# Patient Record
Sex: Female | Born: 1980 | Race: White | Hispanic: No | Marital: Single | Smoking: Never smoker
Health system: Southern US, Community
[De-identification: ages and names within clinical notes are randomized; demographics above are authoritative.]

## PROBLEM LIST (undated history)

## (undated) DIAGNOSIS — E079 Disorder of thyroid, unspecified: Secondary | ICD-10-CM

## (undated) DIAGNOSIS — E039 Hypothyroidism, unspecified: Secondary | ICD-10-CM

## (undated) DIAGNOSIS — E669 Obesity, unspecified: Secondary | ICD-10-CM

## (undated) HISTORY — DX: Hypothyroidism, unspecified: E03.9

---

## 2004-03-08 ENCOUNTER — Encounter (HOSPITAL_COMMUNITY): Admission: RE | Admit: 2004-03-08 | Discharge: 2004-03-09 | Payer: Self-pay | Admitting: Cardiology

## 2020-04-02 ENCOUNTER — Other Ambulatory Visit: Payer: Self-pay

## 2020-04-02 ENCOUNTER — Emergency Department (HOSPITAL_BASED_OUTPATIENT_CLINIC_OR_DEPARTMENT_OTHER): Payer: Self-pay

## 2020-04-02 ENCOUNTER — Encounter (HOSPITAL_BASED_OUTPATIENT_CLINIC_OR_DEPARTMENT_OTHER): Payer: Self-pay | Admitting: Emergency Medicine

## 2020-04-02 ENCOUNTER — Ambulatory Visit
Admission: EM | Admit: 2020-04-02 | Discharge: 2020-04-02 | Disposition: A | Discharge status: Home / Self Care | Payer: Self-pay

## 2020-04-02 ENCOUNTER — Inpatient Hospital Stay (HOSPITAL_BASED_OUTPATIENT_CLINIC_OR_DEPARTMENT_OTHER)
Admit: 2020-04-02 | Discharge: 2020-04-19 | DRG: 871 | Disposition: A | Discharge status: Home Health | Payer: Self-pay | Source: Other Acute Inpatient Hospital | Attending: Family Medicine | Admitting: Family Medicine

## 2020-04-02 DIAGNOSIS — Z79890 Hormone replacement therapy (postmenopausal): Secondary | ICD-10-CM

## 2020-04-02 DIAGNOSIS — K5900 Constipation, unspecified: Secondary | ICD-10-CM | POA: Diagnosis not present

## 2020-04-02 DIAGNOSIS — E039 Hypothyroidism, unspecified: Secondary | ICD-10-CM | POA: Diagnosis present

## 2020-04-02 DIAGNOSIS — K760 Fatty (change of) liver, not elsewhere classified: Secondary | ICD-10-CM | POA: Diagnosis present

## 2020-04-02 DIAGNOSIS — K8020 Calculus of gallbladder without cholecystitis without obstruction: Secondary | ICD-10-CM

## 2020-04-02 DIAGNOSIS — K8000 Calculus of gallbladder with acute cholecystitis without obstruction: Secondary | ICD-10-CM | POA: Diagnosis present

## 2020-04-02 DIAGNOSIS — N179 Acute kidney failure, unspecified: Secondary | ICD-10-CM

## 2020-04-02 DIAGNOSIS — R4689 Other symptoms and signs involving appearance and behavior: Secondary | ICD-10-CM

## 2020-04-02 DIAGNOSIS — R739 Hyperglycemia, unspecified: Secondary | ICD-10-CM

## 2020-04-02 DIAGNOSIS — E440 Moderate protein-calorie malnutrition: Secondary | ICD-10-CM | POA: Diagnosis present

## 2020-04-02 DIAGNOSIS — R0602 Shortness of breath: Secondary | ICD-10-CM

## 2020-04-02 DIAGNOSIS — K819 Cholecystitis, unspecified: Secondary | ICD-10-CM

## 2020-04-02 DIAGNOSIS — Z6841 Body Mass Index (BMI) 40.0 and over, adult: Secondary | ICD-10-CM

## 2020-04-02 DIAGNOSIS — R4189 Other symptoms and signs involving cognitive functions and awareness: Secondary | ICD-10-CM

## 2020-04-02 DIAGNOSIS — K851 Biliary acute pancreatitis without necrosis or infection: Secondary | ICD-10-CM | POA: Diagnosis present

## 2020-04-02 DIAGNOSIS — R209 Unspecified disturbances of skin sensation: Secondary | ICD-10-CM

## 2020-04-02 DIAGNOSIS — E860 Dehydration: Secondary | ICD-10-CM | POA: Diagnosis present

## 2020-04-02 DIAGNOSIS — R748 Abnormal levels of other serum enzymes: Secondary | ICD-10-CM

## 2020-04-02 DIAGNOSIS — R231 Pallor: Secondary | ICD-10-CM

## 2020-04-02 DIAGNOSIS — E872 Acidosis: Secondary | ICD-10-CM | POA: Diagnosis present

## 2020-04-02 DIAGNOSIS — R6510 Systemic inflammatory response syndrome (SIRS) of non-infectious origin without acute organ dysfunction: Secondary | ICD-10-CM | POA: Diagnosis present

## 2020-04-02 DIAGNOSIS — K810 Acute cholecystitis: Secondary | ICD-10-CM

## 2020-04-02 DIAGNOSIS — J9601 Acute respiratory failure with hypoxia: Secondary | ICD-10-CM

## 2020-04-02 DIAGNOSIS — K859 Acute pancreatitis without necrosis or infection, unspecified: Secondary | ICD-10-CM

## 2020-04-02 DIAGNOSIS — M549 Dorsalgia, unspecified: Secondary | ICD-10-CM

## 2020-04-02 DIAGNOSIS — R1084 Generalized abdominal pain: Secondary | ICD-10-CM

## 2020-04-02 DIAGNOSIS — R61 Generalized hyperhidrosis: Secondary | ICD-10-CM

## 2020-04-02 DIAGNOSIS — A419 Sepsis, unspecified organism: Principal | ICD-10-CM

## 2020-04-02 DIAGNOSIS — N390 Urinary tract infection, site not specified: Secondary | ICD-10-CM | POA: Diagnosis not present

## 2020-04-02 DIAGNOSIS — Z833 Family history of diabetes mellitus: Secondary | ICD-10-CM

## 2020-04-02 DIAGNOSIS — E662 Morbid (severe) obesity with alveolar hypoventilation: Secondary | ICD-10-CM | POA: Diagnosis present

## 2020-04-02 DIAGNOSIS — Z20822 Contact with and (suspected) exposure to covid-19: Secondary | ICD-10-CM | POA: Diagnosis present

## 2020-04-02 DIAGNOSIS — R1013 Epigastric pain: Secondary | ICD-10-CM

## 2020-04-02 DIAGNOSIS — R188 Other ascites: Secondary | ICD-10-CM

## 2020-04-02 DIAGNOSIS — B9620 Unspecified Escherichia coli [E. coli] as the cause of diseases classified elsewhere: Secondary | ICD-10-CM | POA: Diagnosis not present

## 2020-04-02 HISTORY — DX: Disorder of thyroid, unspecified: E07.9

## 2020-04-02 HISTORY — DX: Obesity, unspecified: E66.9

## 2020-04-02 LAB — COMPREHENSIVE METABOLIC PANEL
ALT: 168 U/L — ABNORMAL HIGH (ref 0–44)
AST: 74 U/L — ABNORMAL HIGH (ref 15–41)
Albumin: 3.8 g/dL (ref 3.5–5.0)
Alkaline Phosphatase: 130 U/L — ABNORMAL HIGH (ref 38–126)
Anion gap: 19 — ABNORMAL HIGH (ref 5–15)
BUN: 20 mg/dL (ref 6–20)
CO2: 17 mmol/L — ABNORMAL LOW (ref 22–32)
Calcium: 7.8 mg/dL — ABNORMAL LOW (ref 8.9–10.3)
Chloride: 100 mmol/L (ref 98–111)
Creatinine, Ser: 1.98 mg/dL — ABNORMAL HIGH (ref 0.44–1.00)
GFR calc Af Amer: 35 mL/min — ABNORMAL LOW (ref >60)
GFR calc non Af Amer: 31 mL/min — ABNORMAL LOW (ref >60)
Glucose, Bld: 228 mg/dL — ABNORMAL HIGH (ref 70–99)
Potassium: 3.6 mmol/L (ref 3.5–5.1)
Sodium: 136 mmol/L (ref 135–145)
Total Bilirubin: 4 mg/dL — ABNORMAL HIGH (ref 0.3–1.2)
Total Protein: 7.7 g/dL (ref 6.5–8.1)

## 2020-04-02 LAB — CBC WITH DIFFERENTIAL/PLATELET
Abs Immature Granulocytes: 1.16 10*3/uL — ABNORMAL HIGH (ref 0.00–0.07)
Basophils Absolute: 0.2 10*3/uL — ABNORMAL HIGH (ref 0.0–0.1)
Basophils Relative: 0 %
Eosinophils Absolute: 0 10*3/uL (ref 0.0–0.5)
Eosinophils Relative: 0 %
HCT: 56.6 % — ABNORMAL HIGH (ref 36.0–46.0)
Hemoglobin: 17.8 g/dL — ABNORMAL HIGH (ref 12.0–15.0)
Immature Granulocytes: 3 %
Lymphocytes Relative: 6 %
Lymphs Abs: 2.6 10*3/uL (ref 0.7–4.0)
MCH: 25.9 pg — ABNORMAL LOW (ref 26.0–34.0)
MCHC: 31.4 g/dL (ref 30.0–36.0)
MCV: 82.3 fL (ref 80.0–100.0)
Monocytes Absolute: 3.5 10*3/uL — ABNORMAL HIGH (ref 0.1–1.0)
Monocytes Relative: 8 %
Neutro Abs: 38.2 10*3/uL — ABNORMAL HIGH (ref 1.7–7.7)
Neutrophils Relative %: 83 %
Platelets: 534 10*3/uL — ABNORMAL HIGH (ref 150–400)
RBC: 6.88 MIL/uL — ABNORMAL HIGH (ref 3.87–5.11)
RDW: 17.4 % — ABNORMAL HIGH (ref 11.5–15.5)
Smear Review: NORMAL
WBC: 45.6 10*3/uL — ABNORMAL HIGH (ref 4.0–10.5)
nRBC: 0 % (ref 0.0–0.2)

## 2020-04-02 LAB — LACTIC ACID, PLASMA
Lactic Acid, Venous: 3.9 mmol/L (ref 0.5–1.9)
Lactic Acid, Venous: 3.3 mmol/L (ref 0.5–1.9)

## 2020-04-02 LAB — APTT: aPTT: 26 seconds (ref 24–36)

## 2020-04-02 LAB — LIPASE, BLOOD: Lipase: 154 U/L — ABNORMAL HIGH (ref 11–51)

## 2020-04-02 LAB — PREGNANCY, URINE: Preg Test, Ur: NEGATIVE

## 2020-04-02 LAB — POCT FASTING CBG KUC MANUAL ENTRY: POCT Glucose (KUC): 212 mg/dL — AB (ref 70–99)

## 2020-04-02 MED ORDER — ONDANSETRON HCL 4 MG/2ML IJ SOLN
4.0000 mg | Freq: Once | INTRAMUSCULAR | Status: AC
  Administered 2020-04-02: 4 mg via INTRAVENOUS
  Filled 2020-04-02: qty 2

## 2020-04-02 MED ORDER — HYDROMORPHONE HCL 1 MG/ML IJ SOLN: 0.5000 mg | INTRAMUSCULAR | Status: DC | PRN

## 2020-04-02 MED ORDER — SODIUM CHLORIDE 0.9 % IV SOLN
2.0000 g | Freq: Once | INTRAVENOUS | Status: AC
  Administered 2020-04-02: 2 g via INTRAVENOUS
  Filled 2020-04-02: qty 2

## 2020-04-02 MED ORDER — SODIUM CHLORIDE 0.9 % IV BOLUS (SEPSIS)
1000.0000 mL | Freq: Once | INTRAVENOUS | Status: AC
  Administered 2020-04-02: 1000 mL via INTRAVENOUS

## 2020-04-02 MED ORDER — LACTATED RINGERS IV BOLUS
1000.0000 mL | Freq: Once | INTRAVENOUS | Status: AC
  Administered 2020-04-03: 1000 mL via INTRAVENOUS

## 2020-04-02 MED ORDER — METOCLOPRAMIDE HCL 5 MG/ML IJ SOLN
10.0000 mg | Freq: Once | INTRAMUSCULAR | Status: AC
  Administered 2020-04-02: 10 mg via INTRAVENOUS
  Filled 2020-04-02: qty 2

## 2020-04-02 MED ORDER — FENTANYL CITRATE (PF) 100 MCG/2ML IJ SOLN
50.0000 ug | Freq: Once | INTRAMUSCULAR | Status: AC
  Administered 2020-04-02: 50 ug via INTRAVENOUS
  Filled 2020-04-02: qty 2

## 2020-04-02 MED ORDER — SODIUM CHLORIDE 0.9 % IV BOLUS (SEPSIS)
600.0000 mL | Freq: Once | INTRAVENOUS | Status: AC
  Administered 2020-04-02: 600 mL via INTRAVENOUS

## 2020-04-02 MED ORDER — METRONIDAZOLE IN NACL 5-0.79 MG/ML-% IV SOLN
500.0000 mg | Freq: Once | INTRAVENOUS | Status: AC
  Administered 2020-04-02: 500 mg via INTRAVENOUS
  Filled 2020-04-02: qty 100

## 2020-04-02 MED ORDER — SODIUM CHLORIDE 0.9 % IV SOLN
2.0000 g | Freq: Two times a day (BID) | INTRAVENOUS | Status: DC
  Administered 2020-04-03: 2 g via INTRAVENOUS
  Filled 2020-04-02: qty 2

## 2020-04-02 MED ORDER — VANCOMYCIN HCL 1750 MG/350ML IV SOLN
1750.0000 mg | INTRAVENOUS | Status: DC
  Filled 2020-04-02: qty 350

## 2020-04-02 MED ORDER — HYDROMORPHONE HCL 1 MG/ML IJ SOLN
0.5000 mg | Freq: Once | INTRAMUSCULAR | Status: AC
  Administered 2020-04-02: 0.5 mg via INTRAVENOUS
  Filled 2020-04-02: qty 1

## 2020-04-02 MED ORDER — VANCOMYCIN HCL IN DEXTROSE 1-5 GM/200ML-% IV SOLN
1000.0000 mg | INTRAVENOUS | Status: AC
  Administered 2020-04-02: 1000 mg via INTRAVENOUS
  Filled 2020-04-02: qty 200

## 2020-04-02 MED ORDER — SODIUM CHLORIDE 0.9 % IV SOLN
1000.0000 mL | INTRAVENOUS | Status: DC
  Administered 2020-04-03: 1000 mL via INTRAVENOUS

## 2020-04-02 MED ORDER — VANCOMYCIN HCL IN DEXTROSE 1-5 GM/200ML-% IV SOLN: 1000.0000 mg | Freq: Once | INTRAVENOUS | Status: DC

## 2020-04-02 NOTE — ED Triage Notes (Signed)
Pt c/o vomiting then center spine back pain then diarrhea x3 days. No vomiting today. Denies injury.

## 2020-04-02 NOTE — Progress Notes (Signed)
Pharmacy Antibiotic Note  Amber May is a 39 y.o. female admitted on 04/02/2020 with abdominal pain and subjective fever concerning for infection of unknown source.  Pharmacy has been consulted for Cefepime and vancomycin dosing. LA 3.9.   Plan: -Cefepime 2 gm IV Q 12 hours -Vancomycin 2 gm IV once followed by vancomycin 1750 mg IV Q 24 hours  -Monitor CBC, renal fx, cultures and clinical progress -VT at SS   Height: 5\' 2"  (157.5 cm) Weight: (!) 147.4 kg (325 lb) IBW/kg (Calculated) : 50.1  Temp (24hrs), Avg:98.1 F (36.7 C), Min:97.7 F (36.5 C), Max:98.5 F (36.9 C)  No results for input(s): WBC, CREATININE, LATICACIDVEN, VANCOTROUGH, VANCOPEAK, VANCORANDOM, GENTTROUGH, GENTPEAK, GENTRANDOM, TOBRATROUGH, TOBRAPEAK, TOBRARND, AMIKACINPEAK, AMIKACINTROU, AMIKACIN in the last 168 hours.  CrCl cannot be calculated (No successful lab value found.).    No Known Allergies  Antimicrobials this admission: Cefepime 6/24 >>  Vancomycin 6/24 >>   Dose adjustments this admission:  Microbiology results: 6/24 BCx:  6/24 UCx:    Thank you for allowing pharmacy to be a part of this patient's care.  7/24, PharmD., BCPS, BCCCP Clinical Pharmacist Clinical phone for 04/02/20 until 11:30pm: [phone] If after 11:30pm, please refer to [facility] for unit-specific pharmacist

## 2020-04-02 NOTE — ED Provider Notes (Signed)
[facility] EMERGENCY DEPARTMENT Provider Note   CSN: [id number] Arrival date & time: 04/02/20  1801     History Chief Complaint  Patient presents with  . Abdominal Pain    Amber May is a 39 y.o. female sent in from urgent care for abnormal vital signs.  History is given by the patient's mother predominantly and the patient.  Patient's mother states that she has had nausea and vomiting for the past 3 days with pretty severe epigastric abdominal pain.  They thought it was very likely a GI bug however today she began running a high fever.  They presented to the urgent care where the patient was found to be diaphoretic with cold extremities and she was sent emergently to the ER.  On arrival patient complains of epigastric abdominal pain.  She gives very short one-word answers and appears very ill.  She denies a history of previous abdominal surgeries.  She denies diarrhea.  HPI     Past Medical History:  Diagnosis Date  . Obesity   . Thyroid disease     There are no problems to display for this patient.   History reviewed. No pertinent surgical history.   OB History   No obstetric history on file.     No family history on file.  Social History   Tobacco Use  . Smoking status: Never Smoker  . Smokeless tobacco: Never Used  Substance Use Topics  . Alcohol use: Not Currently  . Drug use: Not Currently    Home Medications Prior to Admission medications   Medication Sig Start Date End Date Taking? Authorizing Provider  levothyroxine (SYNTHROID) 75 MCG tablet Take 75 mcg by mouth daily before breakfast.    [provider]    Allergies    Patient has no known allergies.  Review of Systems   Review of Systems Ten systems reviewed and are negative for acute change, except as noted in the HPI.   Physical Exam Updated Vital Signs BP 122/60 (BP Location: Right Arm)   Pulse (!) 132   Temp 98.5 F (36.9 C) (Oral)   Resp (!) 36   Ht 5\' 2"   (1.575 m)   Wt (!) 147.4 kg   LMP 03/09/2020   SpO2 96%   BMI 59.44 kg/m   Physical Exam Vitals and nursing note reviewed.  Constitutional:      Appearance: She is obese. She is ill-appearing, toxic-appearing and diaphoretic.  HENT:     Head: Normocephalic and atraumatic.     Mouth/Throat:     Mouth: Mucous membranes are dry.  Eyes:     Extraocular Movements: Extraocular movements intact.     Pupils: Pupils are equal, round, and reactive to light.  Cardiovascular:     Rate and Rhythm: Tachycardia present.  Pulmonary:     Comments: Respiratory rate in the 40s on my exam Tachypneic Breath sounds limited due to body habitus Abdominal:     General: There is distension.     Tenderness: There is abdominal tenderness. There is guarding and rebound.     Comments: Patient exquisitely tender to palpation in the epigastric region of the abdomen.  Exam is somewhat limited due to body habitus.  Abdomen is obese.  Appears distended.  Patient has pain with minimal jostling of the bed in the epigastric region.  Musculoskeletal:     Cervical back: Normal range of motion. No rigidity.     Right lower leg: No edema.     Left  lower leg: No edema.  Skin:    Capillary Refill: Capillary refill takes more than 3 seconds.     Coloration: Skin is pale.     Comments: Upper and lower extremities are cold, clammy.  Skin is diaphoretic.  Capillary refill is greater than 6 seconds  Neurological:     General: No focal deficit present.     Mental Status: She is alert and oriented to person, place, and time.     ED Results / Procedures / Treatments   Labs (all labs ordered are listed, but only abnormal results are displayed) Labs Reviewed  LACTIC ACID, PLASMA - Abnormal; Notable for the following components:      Result Value   Lactic Acid, Venous 3.9 (*)    All other components within normal limits  LACTIC ACID, PLASMA - Abnormal; Notable for the following components:   Lactic Acid, Venous 3.3 (*)     All other components within normal limits  COMPREHENSIVE METABOLIC PANEL - Abnormal; Notable for the following components:   CO2 17 (*)    Glucose, Bld 228 (*)    Creatinine, Ser 1.98 (*)    Calcium 7.8 (*)    AST 74 (*)    ALT 168 (*)    Alkaline Phosphatase 130 (*)    Total Bilirubin 4.0 (*)    GFR calc non Af Amer 31 (*)    GFR calc Af Amer 35 (*)    Anion gap 19 (*)    All other components within normal limits  CBC WITH DIFFERENTIAL/PLATELET - Abnormal; Notable for the following components:   WBC 45.6 (*)    RBC 6.88 (*)    Hemoglobin 17.8 (*)    HCT 56.6 (*)    MCH 25.9 (*)    RDW 17.4 (*)    Platelets 534 (*)    Neutro Abs 38.2 (*)    Monocytes Absolute 3.5 (*)    Basophils Absolute 0.2 (*)    Abs Immature Granulocytes 1.16 (*)    All other components within normal limits  LIPASE, BLOOD - Abnormal; Notable for the following components:   Lipase 154 (*)    All other components within normal limits  CULTURE, BLOOD (ROUTINE X 2)  CULTURE, BLOOD (ROUTINE X 2)  URINE CULTURE  SARS CORONAVIRUS 2 BY RT PCR (HOSPITAL ORDER, PERFORMED IN [facility])  APTT  PREGNANCY, URINE    EKG None  Radiology No results found.  Procedures .Critical Care Performed by: Arthor Captain, PA-C Authorized by: Arthor Captain, PA-C   Critical care provider statement:    Critical care time (minutes):  70   Critical care time was exclusive of:  Separately billable procedures and treating other patients   Critical care was necessary to treat or prevent imminent or life-threatening deterioration of the following conditions:  Sepsis   Critical care was time spent personally by me on the following activities:  Discussions with consultants, evaluation of patient's response to treatment, examination of patient, ordering and performing treatments and interventions, ordering and review of laboratory studies, ordering and review of radiographic studies, pulse oximetry,  re-evaluation of patient's condition, obtaining history from patient or surrogate and review of old charts   (including critical care time)  Medications Ordered in ED Medications  sodium chloride 0.9 % bolus 1,000 mL (0 mLs Intravenous Stopped 04/02/20 1856)    And  sodium chloride 0.9 % bolus 600 mL (has no administration in time range)  ceFEPIme (MAXIPIME) 2 g in sodium  chloride 0.9 % 100 mL IVPB (2 g Intravenous New Bag/Given 04/02/20 1909)  metroNIDAZOLE (FLAGYL) IVPB 500 mg (has no administration in time range)  vancomycin (VANCOCIN) IVPB 1000 mg/200 mL premix (has no administration in time range)  fentaNYL (SUBLIMAZE) injection 50 mcg (has no administration in time range)  ondansetron (ZOFRAN) injection 4 mg (has no administration in time range)    ED Course  I have reviewed the triage vital signs and the nursing notes.  Pertinent labs & imaging results that were available during my care of the patient were reviewed by me and considered in my medical decision making (see chart for details).  Clinical Course as of Apr 02 2299  Thu Apr 02, 2020  1930 Creatinine(!): 1.98 [AH]  1931 Glucose(!): 228 [AH]  1931 Creatinine(!): 1.98 [AH]  2047 WBC(!): 45.6 [AH]    Clinical Course User Index [AH] Arthor Captain, PA-C   MDM Rules/Calculators/A&P                          [id number] pain VS:  Vitals:   04/02/20 2100 04/02/20 2130 04/02/20 2200 04/02/20 2230  BP: (!) 127/109 (!) 130/96 (!) 124/93 (!) 128/94  Pulse: (!) 110 (!) 106 (!) 102 (!) 111  Resp: 20 20 20 20   Temp:  99.2 F (37.3 C)    TempSrc:  Oral    SpO2: 94% 95% 95% 96%  Weight:      Height:        is gathered by patient  and mother. Previous records obtained and reviewed. DDX:The patient's complaint of abdominal pain  involves an extensive number of diagnostic and treatment options, and is a complaint that carries with it a high risk of complications, morbidity, and potential mortality. Given the  large differential diagnosis, medical decision making is of high complexity. Differential diagnosis of epigastric pain includes: Functional or nonulcer dyspepsiaPUD, GERD, Gastritis, (NSAIDs, alcohol, stress, H. pylori, pernicious anemia), pancreatitis or pancreatic cancer, overeating indigestion (high-fat foods, coffee), drugs (aspirin, antibiotics (eg, macrolides, metronidazole), corticosteroids, digoxin, narcotics, theophylline), gastroparesis, lactose intolerance, malabsorption gastric cancer, parasitic infection, (Giardia, Strongyloides, Ascaris) cholelithiasis, choledocholithiasis, or cholangitis, ACS, pericarditis, pneumonia, abdominal hernia, pregnancy, intestinal ischemia, esophageal rupture, gastric volvulus, hepatitis.  Labs: Reviewed in medical decision making  Imaging: I ordered and reviewed images which included 1 view chest x-ray and CT abdomen pelvis without contrast. I independently visualized and interpreted all imaging. Significant findings include edema and peripancreatic stranding, layering inflammatory process within the gallbladder either sludge or stones, surrounding fluid and ascites in the pelvis.  Common bile duct unable to be fully visualized. There are no acute, significant findings on chest x-ray images.  Consults:GI and hospitalist service MDM: 39 year old female who presents emergency department chief complaint of severe abdominal pain.  Patient septic protocol and a code sepsis initiated with fluids per ideal body weight and broad-spectrum antibiotics with vancomycin cefepime and Flagyl ordered.  Patient's labs reveal significantly severe leukocytosis of 45,000, hemoglobin of 17.8 and elevated platelets.  There is likely some volume contraction given the fact that the CMP shows creatinine of 1.98 and AKI.  Patient's glucose is 228 likely reactive.  She has marked elevated AST, ALT and alkaline phosphatase with a bilirubin of 4.  Anion gap of 19 is likely due to her lactic  acidosis.  Patient's lactic acid has improved from 3.9 down to 3.3 after 3600 mL of normal saline.  Will continue to aggressively hydrate I have ordered 1 L  of lactated Ringer's.  Pain is unfortunately still poorly controlled she rates it at 8 out of 10.  Have scheduled pain medication hourly for the patient.  Believe the patient may have choledocholithiasis with gallstone pancreatitis versus ascending cholangitis.  Dr. Rhea Belton asked that we continue aggressive fluid hydration, pain control and trend patient's LFTs and lipase and they will see her tomorrow.  Patient will be admitted to the stepdown unit Patient disposition: Admit The patient appears reasonably screened and/or stabilized for discharge and I doubt any other medical condition or other [facility] requiring further screening, evaluation, or treatment in the ED at this time prior to discharge. I have discussed lab and/or imaging findings with the patient and answered all questions/concerns to the best of my ability.I have discussed return precautions and OP follow up.    Final Clinical Impression(s) / ED Diagnoses Final diagnoses:  Sepsis, due to unspecified organism, unspecified whether acute organ dysfunction present (HCC)  AKI (acute kidney injury) (HCC)  Elevated liver enzymes  Elevated lipase  Epigastric abdominal pain    Rx / DC Orders ED Discharge Orders    None       Arthor Captain, PA-C 04/02/20 2319    Rolan Bucco, MD 04/02/20 2355

## 2020-04-02 NOTE — ED Triage Notes (Signed)
abd pain, vomiting, subjective fever x 3 days. Sent from UC.

## 2020-04-02 NOTE — ED Provider Notes (Signed)
[facility]    CSN: [id number] Arrival date & time: 04/02/20  1650      History   Chief Complaint Chief Complaint  Patient presents with  . Back Pain    HPI Amber May is a 39 y.o. female with history of obesity, thyroid disease presenting with her mother for evaluation of abdominal and back pain with vomiting. Mother provides history: States patient vomited prior to back pain. Has had diarrhea for the last 3 days without blood or melena. Denies history of diabetes. Patient appears tachypneic, diaphoretic, pale. Mother states behaviors change from baseline over the last few days: More spacey than normal. No history of stroke or heart attack.   Past Medical History:  Diagnosis Date  . Obesity   . Thyroid disease     Patient Active Problem List   Diagnosis Date Noted  . AKI (acute kidney injury) (HCC) 04/03/2020  . Acute pancreatitis 04/03/2020  . Hypothyroidism 04/03/2020  . Hyperglycemia 04/03/2020  . Elevated liver enzymes   . Acute respiratory failure with hypoxemia (HCC)   . SIRS (systemic inflammatory response syndrome) (HCC) 04/02/2020    History reviewed. No pertinent surgical history.  OB History   No obstetric history on file.      Home Medications    Prior to Admission medications   Medication Sig Start Date End Date Taking? Authorizing Provider  levothyroxine (SYNTHROID) 125 MCG tablet Take 125 mcg by mouth daily before breakfast.    [provider]    Family History Family History  Problem Relation Age of Onset  . Diabetes Mellitus II Maternal Grandmother     Social History Social History   Tobacco Use  . Smoking status: Never Smoker  . Smokeless tobacco: Never Used  Substance Use Topics  . Alcohol use: Not Currently  . Drug use: Not Currently     Allergies   Patient has no known allergies.   Review of Systems As per HPI   Physical Exam Triage Vital Signs ED Triage Vitals  Enc Vitals Group     BP       Pulse      Resp      Temp      Temp src      SpO2      Weight      Height      Head Circumference      Peak Flow      Pain Score      Pain Loc      Pain Edu?      Excl. in GC?    No data found.  Updated Vital Signs Pulse (!) 137   Temp 97.7 F (36.5 C) (Oral)   LMP 03/09/2020   SpO2 93%   Visual Acuity Right Eye Distance:   Left Eye Distance:   Bilateral Distance:    Right Eye Near:   Left Eye Near:    Bilateral Near:     Physical Exam Constitutional:      General: She is not in acute distress.    Appearance: She is obese. She is ill-appearing and diaphoretic.  HENT:     Head: Normocephalic and atraumatic.  Eyes:     General: No scleral icterus.    Pupils: Pupils are equal, round, and reactive to light.  Neck:     Comments: Trachea midline, negative JVD Cardiovascular:     Rate and Rhythm: Regular rhythm. Tachycardia present.  Pulmonary:     Effort: Respiratory distress present.  Breath sounds: No wheezing.     Comments: Tachypneic with accessory muscle usage Skin:    Capillary Refill: Capillary refill takes more than 3 seconds.     Coloration: Skin is pale. Skin is not jaundiced.  Neurological:     Mental Status: She is alert.     Comments: Somewhat blank stare, slow to respond to questions.      UC Treatments / Results  Labs (all labs ordered are listed, but only abnormal results are displayed) Labs Reviewed  POCT FASTING [facility]G [facility] MANUAL ENTRY - Abnormal; Notable for the following components:      Result Value   POCT Glucose ([facility]) 212 (*)    All other components within normal limits    EKG   Radiology CT ABDOMEN PELVIS WO CONTRAST  Result Date: 04/02/2020 CLINICAL DATA:  Abdominal distension. EXAM: CT ABDOMEN AND PELVIS WITHOUT CONTRAST TECHNIQUE: Multidetector CT imaging of the abdomen and pelvis was performed following the standard protocol without IV contrast. COMPARISON:  None. FINDINGS: Lower chest: Motion artifact through the lung  bases. Elevation of right hemidiaphragm with compressive atelectasis. Possible trace right pleural effusion. Hepatobiliary: Enlarged liver spanning 20 cm cranial caudal with mild steatosis. No evidence of discrete focal lesion. Motion limits assessment for capsular nodularity. Layering hyperdensity in the gallbladder. Ascites adjacent to the gallbladder which is similar to the ascites adjacent to the liver. Common bile duct is not well-defined, there is no obvious biliary dilatation. Pancreas: Peripancreatic fat stranding and edema adjacent to the head and uncinate process. Small amount of non organized peripancreatic fluid. No ductal dilatation. No evidence of acute peripancreatic collection. Cannot assess for necrosis in the absence of IV contrast. Spleen: Normal in size without focal abnormality. Adrenals/Urinary Tract: Normal adrenal glands. No renal stones or hydronephrosis. Both ureters are decompressed without stones along the course. Urinary bladder is nondistended. Stomach/Bowel: Fluid/ingested material in the stomach. Mild duodenal wall thickening in the fourth portion adjacent to the pancreatic inflammation. There is no small bowel obstruction or inflammatory change. Detailed bowel assessment is limited in the absence of contrast and presence of abdominal ascites. The majority of the colon is decompressed. Vascular/Lymphatic: Normal caliber abdominal aorta. Few prominent periportal lymph nodes are likely reactive. Reproductive: Abnormal low-density in the lower uterine segment/cervix spanning 18 mm, nonspecific. No evidence of adnexal mass. Other: Small to moderate volume of abdominopelvic ascites. There is free fluid in both upper quadrants, both pericolic gutters, mesentery, and tracking into the pelvis. No free air or organized fluid collection. Small fat containing umbilical hernia. Musculoskeletal: Degenerative change in the spine. There are no acute or suspicious osseous abnormalities. IMPRESSION:  1. Acute edematous pancreatitis. No evidence of acute peripancreatic collection. 2. Hepatomegaly and hepatic steatosis. Layering hyperdensity in the gallbladder may represent sludge or layering stones. 3. Small to moderate volume of abdominopelvic ascites. This may be in part related to pancreatitis, underlying liver disease, or combination thereof. 4. Abnormal low-density in the lower uterine segment/cervix spanning 18 mm, nonspecific. Recommend nonemergent pelvic ultrasound for characterization on an elective outpatient basis. Electronically Signed   By: Narda Rutherford M.D.   On: 04/02/2020 20:06   MR ABDOMEN MRCP WO CONTRAST  Result Date: 04/03/2020 CLINICAL DATA:  Cholelithiasis. Known pancreatitis. Patient had difficulty breathing and was unable to complete the last series of the exam. EXAM: MRI ABDOMEN WITHOUT CONTRAST  (INCLUDING MRCP) TECHNIQUE: Multiplanar multisequence MR imaging of the abdomen was performed. Heavily T2-weighted images of the biliary and pancreatic ducts were obtained, and three-dimensional  MRCP images were rendered by post processing. COMPARISON:  CT scan 04/02/2020 FINDINGS: Lower chest: Bilateral pleural effusions associated with bibasilar collapse/consolidative changes. Hepatobiliary: Liver measures 20.8 cm craniocaudal length, enlarged. Loss of signal in the liver parenchyma on out of phase T1 imaging suggests fatty deposition. No focal abnormality in the liver parenchyma on this noncontrast motion degraded study. Numerous tiny gallstones evident with apparent gallbladder wall thickening. No intrahepatic biliary duct dilatation. No extrahepatic biliary duct dilatation. Study is markedly limited, but no definite choledocholithiasis. Axial T2 haste imaging demonstrates some central flow artifact in the common duct, but no definite ductal stones. Pancreas: No main duct dilatation. No gross mass lesion in the pancreas. Mild edema seen in the region of the pancreatic head. Spleen:   No splenomegaly. No focal mass lesion. Adrenals/Urinary Tract: No adrenal nodule or mass. No hydronephrosis. No gross renal mass on this noncontrast study. Probable 11 mm cyst interpolar left kidney. Stomach/Bowel: Stomach is unremarkable. No gastric wall thickening. No evidence of outlet obstruction. Duodenum is normally positioned as is the ligament of Treitz. No small bowel or colonic dilatation within the visualized abdomen. Vascular/Lymphatic: No abdominal aortic aneurysm. There is no gastrohepatic or hepatoduodenal ligament lymphadenopathy. No retroperitoneal or mesenteric lymphadenopathy. Other:  Moderate volume intraperitoneal free fluid. Musculoskeletal: No suspicious marrow signal abnormality within the visualized bony anatomy. IMPRESSION: 1. Limited study due to motion artifact and lack of intravenous contrast. Patient was unable to complete the entire exam. 2. Cholelithiasis with apparent gallbladder wall thickening. No intrahepatic or extrahepatic biliary duct dilatation. No definite choledocholithiasis. If there is clinical concern for acute cholecystitis, abdominal ultrasound or nuclear scintigraphy may prove helpful to further evaluate. 3. Mild edema in the region of the pancreatic head. No main duct dilatation. Imaging features may be related to pancreatitis. 4. Hepatomegaly with steatosis. 5. Moderate volume intraperitoneal free fluid. 6. Bilateral pleural effusions associated with bibasilar collapse/consolidative changes. Electronically Signed   By: Kennith Center M.D.   On: 04/03/2020 12:02   MR 3D Recon At Scanner  Result Date: 04/03/2020 CLINICAL DATA:  Cholelithiasis. Known pancreatitis. Patient had difficulty breathing and was unable to complete the last series of the exam. EXAM: MRI ABDOMEN WITHOUT CONTRAST  (INCLUDING MRCP) TECHNIQUE: Multiplanar multisequence MR imaging of the abdomen was performed. Heavily T2-weighted images of the biliary and pancreatic ducts were obtained, and  three-dimensional MRCP images were rendered by post processing. COMPARISON:  CT scan 04/02/2020 FINDINGS: Lower chest: Bilateral pleural effusions associated with bibasilar collapse/consolidative changes. Hepatobiliary: Liver measures 20.8 cm craniocaudal length, enlarged. Loss of signal in the liver parenchyma on out of phase T1 imaging suggests fatty deposition. No focal abnormality in the liver parenchyma on this noncontrast motion degraded study. Numerous tiny gallstones evident with apparent gallbladder wall thickening. No intrahepatic biliary duct dilatation. No extrahepatic biliary duct dilatation. Study is markedly limited, but no definite choledocholithiasis. Axial T2 haste imaging demonstrates some central flow artifact in the common duct, but no definite ductal stones. Pancreas: No main duct dilatation. No gross mass lesion in the pancreas. Mild edema seen in the region of the pancreatic head. Spleen:  No splenomegaly. No focal mass lesion. Adrenals/Urinary Tract: No adrenal nodule or mass. No hydronephrosis. No gross renal mass on this noncontrast study. Probable 11 mm cyst interpolar left kidney. Stomach/Bowel: Stomach is unremarkable. No gastric wall thickening. No evidence of outlet obstruction. Duodenum is normally positioned as is the ligament of Treitz. No small bowel or colonic dilatation within the visualized abdomen. Vascular/Lymphatic: No abdominal aortic  aneurysm. There is no gastrohepatic or hepatoduodenal ligament lymphadenopathy. No retroperitoneal or mesenteric lymphadenopathy. Other:  Moderate volume intraperitoneal free fluid. Musculoskeletal: No suspicious marrow signal abnormality within the visualized bony anatomy. IMPRESSION: 1. Limited study due to motion artifact and lack of intravenous contrast. Patient was unable to complete the entire exam. 2. Cholelithiasis with apparent gallbladder wall thickening. No intrahepatic or extrahepatic biliary duct dilatation. No definite  choledocholithiasis. If there is clinical concern for acute cholecystitis, abdominal ultrasound or nuclear scintigraphy may prove helpful to further evaluate. 3. Mild edema in the region of the pancreatic head. No main duct dilatation. Imaging features may be related to pancreatitis. 4. Hepatomegaly with steatosis. 5. Moderate volume intraperitoneal free fluid. 6. Bilateral pleural effusions associated with bibasilar collapse/consolidative changes. Electronically Signed   By: Misty Stanley M.D.   On: 04/03/2020 12:02   DG CHEST PORT 1 VIEW  Result Date: 04/03/2020 CLINICAL DATA:  Hypoxia, shortness of breath EXAM: PORTABLE CHEST 1 VIEW COMPARISON:  04/02/2020 FINDINGS: Examination is significantly limited secondary to poor penetration from patient body habitus, semiupright portable technique, and poor inspiratory effort. Very low lung volumes with accentuation of the heart size. Hazy and streaky bibasilar opacities suggesting small bilateral pleural effusions with bibasilar atelectasis. No pneumothorax. IMPRESSION: Limited examination. Hazy and streaky bibasilar opacities suggesting small bilateral pleural effusions with bibasilar atelectasis. Electronically Signed   By: Davina Poke D.O.   On: 04/03/2020 13:59   DG Chest Port 1 View  Result Date: 04/02/2020 CLINICAL DATA:  Sepsis EXAM: PORTABLE CHEST 1 VIEW COMPARISON:  None. FINDINGS: Low lung volume with hypoventilatory change. Atelectasis at the right base. Borderline cardiomegaly augmented by low lung volume. No pneumothorax. IMPRESSION: Low lung volume with atelectasis at the right base. Electronically Signed   By: Donavan Foil M.D.   On: 04/02/2020 20:08    Procedures Procedures (including critical care time)  Medications Ordered in UC Medications - No data to display  Initial Impression / Assessment and Plan / UC Course  I have reviewed the triage vital signs and the nursing notes.  Pertinent labs & imaging results that were  available during my care of the patient were reviewed by me and considered in my medical decision making (see chart for details).     Patient appears diaphoretic, pale, tachypneic with tachycardia. No significant comorbidities, though patient clinically appears ill. There is change from baseline and cognitive behavioral function per mother. EKG done office, reviewed by me without previous to compare.  Sinus tachycardia with ventricular rate of 133 bpm.  No QTC prolongation, there are multiple leads with polymorphic P waves.  No ST elevation or depression.  Discussed findings with patient and mother.  Recommended to ER for further evaluation due to hyperglycemia, abnormal EKG, clinical appearance.  Mother declined: Health and safety inspector to transport and personal vehicle.  Return precautions discussed, patient verbalized understanding and is agreeable to plan. Final Clinical Impressions(s) / UC Diagnoses   Final diagnoses:  Diaphoresis  Cold and clammy skin  Acute midline back pain, unspecified back location  Generalized abdominal pain  Hyperglycemia  Cognitive and behavioral changes   Discharge Instructions   None    ED Prescriptions    None     I have reviewed the PDMP during this encounter.   [place], [place], [place] 04/03/20 1649

## 2020-04-03 ENCOUNTER — Encounter (HOSPITAL_COMMUNITY): Payer: Self-pay | Admitting: Internal Medicine

## 2020-04-03 ENCOUNTER — Inpatient Hospital Stay (HOSPITAL_COMMUNITY): Payer: Self-pay

## 2020-04-03 ENCOUNTER — Encounter: Payer: Self-pay | Admitting: Emergency Medicine

## 2020-04-03 DIAGNOSIS — J9601 Acute respiratory failure with hypoxia: Secondary | ICD-10-CM

## 2020-04-03 DIAGNOSIS — N179 Acute kidney failure, unspecified: Secondary | ICD-10-CM

## 2020-04-03 DIAGNOSIS — K859 Acute pancreatitis without necrosis or infection, unspecified: Secondary | ICD-10-CM | POA: Diagnosis present

## 2020-04-03 DIAGNOSIS — R6510 Systemic inflammatory response syndrome (SIRS) of non-infectious origin without acute organ dysfunction: Secondary | ICD-10-CM

## 2020-04-03 DIAGNOSIS — R739 Hyperglycemia, unspecified: Secondary | ICD-10-CM

## 2020-04-03 DIAGNOSIS — R748 Abnormal levels of other serum enzymes: Secondary | ICD-10-CM

## 2020-04-03 DIAGNOSIS — E039 Hypothyroidism, unspecified: Secondary | ICD-10-CM | POA: Diagnosis present

## 2020-04-03 LAB — BASIC METABOLIC PANEL
Anion gap: 11 (ref 5–15)
BUN: 21 mg/dL — ABNORMAL HIGH (ref 6–20)
CO2: 18 mmol/L — ABNORMAL LOW (ref 22–32)
Calcium: 6.4 mg/dL — CL (ref 8.9–10.3)
Chloride: 106 mmol/L (ref 98–111)
Creatinine, Ser: 1.22 mg/dL — ABNORMAL HIGH (ref 0.44–1.00)
GFR calc Af Amer: >60 mL/min (ref >60)
GFR calc non Af Amer: 55 mL/min — ABNORMAL LOW (ref >60)
Glucose, Bld: 172 mg/dL — ABNORMAL HIGH (ref 70–99)
Potassium: 4.1 mmol/L (ref 3.5–5.1)
Sodium: 135 mmol/L (ref 135–145)

## 2020-04-03 LAB — CBC WITH DIFFERENTIAL/PLATELET
Abs Immature Granulocytes: 0.72 10*3/uL — ABNORMAL HIGH (ref 0.00–0.07)
Abs Immature Granulocytes: 0.43 10*3/uL — ABNORMAL HIGH (ref 0.00–0.07)
Basophils Absolute: 0.1 10*3/uL (ref 0.0–0.1)
Basophils Absolute: 0.2 10*3/uL — ABNORMAL HIGH (ref 0.0–0.1)
Basophils Relative: 0 %
Basophils Relative: 0 %
Eosinophils Absolute: 0 10*3/uL (ref 0.0–0.5)
Eosinophils Absolute: 0 10*3/uL (ref 0.0–0.5)
Eosinophils Relative: 0 %
Eosinophils Relative: 0 %
HCT: 50.5 % — ABNORMAL HIGH (ref 36.0–46.0)
HCT: 48.7 % — ABNORMAL HIGH (ref 36.0–46.0)
Hemoglobin: 16 g/dL — ABNORMAL HIGH (ref 12.0–15.0)
Hemoglobin: 15.5 g/dL — ABNORMAL HIGH (ref 12.0–15.0)
Immature Granulocytes: 2 %
Immature Granulocytes: 1 %
Lymphocytes Relative: 5 %
Lymphocytes Relative: 5 %
Lymphs Abs: 1.8 10*3/uL (ref 0.7–4.0)
Lymphs Abs: 1.8 10*3/uL (ref 0.7–4.0)
MCH: 26.3 pg (ref 26.0–34.0)
MCH: 26.3 pg (ref 26.0–34.0)
MCHC: 31.7 g/dL (ref 30.0–36.0)
MCHC: 31.8 g/dL (ref 30.0–36.0)
MCV: 83.1 fL (ref 80.0–100.0)
MCV: 82.5 fL (ref 80.0–100.0)
Monocytes Absolute: 2.4 10*3/uL — ABNORMAL HIGH (ref 0.1–1.0)
Monocytes Absolute: 2.2 10*3/uL — ABNORMAL HIGH (ref 0.1–1.0)
Monocytes Relative: 6 %
Monocytes Relative: 6 %
Neutro Abs: 34.2 10*3/uL — ABNORMAL HIGH (ref 1.7–7.7)
Neutro Abs: 33.8 10*3/uL — ABNORMAL HIGH (ref 1.7–7.7)
Neutrophils Relative %: 87 %
Neutrophils Relative %: 88 %
Platelets: 333 10*3/uL (ref 150–400)
Platelets: 289 10*3/uL (ref 150–400)
RBC: 6.08 MIL/uL — ABNORMAL HIGH (ref 3.87–5.11)
RBC: 5.9 MIL/uL — ABNORMAL HIGH (ref 3.87–5.11)
RDW: 16.8 % — ABNORMAL HIGH (ref 11.5–15.5)
RDW: 16.8 % — ABNORMAL HIGH (ref 11.5–15.5)
WBC Morphology: INCREASED
WBC: 39.3 10*3/uL — ABNORMAL HIGH (ref 4.0–10.5)
WBC: 38.3 10*3/uL — ABNORMAL HIGH (ref 4.0–10.5)
nRBC: 0 % (ref 0.0–0.2)
nRBC: 0 % (ref 0.0–0.2)

## 2020-04-03 LAB — LACTIC ACID, PLASMA
Lactic Acid, Venous: 2.2 mmol/L (ref 0.5–1.9)
Lactic Acid, Venous: 2.6 mmol/L (ref 0.5–1.9)
Lactic Acid, Venous: 2.2 mmol/L (ref 0.5–1.9)
Lactic Acid, Venous: 1.9 mmol/L (ref 0.5–1.9)

## 2020-04-03 LAB — HIV ANTIBODY (ROUTINE TESTING W REFLEX): HIV Screen 4th Generation wRfx: NONREACTIVE

## 2020-04-03 LAB — HEPATIC FUNCTION PANEL
ALT: 101 U/L — ABNORMAL HIGH (ref 0–44)
AST: 33 U/L (ref 15–41)
Albumin: 2.8 g/dL — ABNORMAL LOW (ref 3.5–5.0)
Alkaline Phosphatase: 96 U/L (ref 38–126)
Bilirubin, Direct: 1.3 mg/dL — ABNORMAL HIGH (ref 0.0–0.2)
Indirect Bilirubin: 1.1 mg/dL — ABNORMAL HIGH (ref 0.3–0.9)
Total Bilirubin: 2.4 mg/dL — ABNORMAL HIGH (ref 0.3–1.2)
Total Protein: 5.8 g/dL — ABNORMAL LOW (ref 6.5–8.1)

## 2020-04-03 LAB — GLUCOSE, CAPILLARY
Glucose-Capillary: 154 mg/dL — ABNORMAL HIGH (ref 70–99)
Glucose-Capillary: 149 mg/dL — ABNORMAL HIGH (ref 70–99)
Glucose-Capillary: 141 mg/dL — ABNORMAL HIGH (ref 70–99)

## 2020-04-03 LAB — PROCALCITONIN: Procalcitonin: 1.45 ng/mL

## 2020-04-03 LAB — C-REACTIVE PROTEIN: CRP: 30.1 mg/dL — ABNORMAL HIGH (ref <1.0)

## 2020-04-03 LAB — TROPONIN I (HIGH SENSITIVITY): Troponin I (High Sensitivity): 54 ng/L — ABNORMAL HIGH (ref <18)

## 2020-04-03 LAB — MAGNESIUM: Magnesium: 1.5 mg/dL — ABNORMAL LOW (ref 1.7–2.4)

## 2020-04-03 LAB — TSH: TSH: 2.379 u[IU]/mL (ref 0.350–4.500)

## 2020-04-03 LAB — HEMOGLOBIN A1C
Hgb A1c MFr Bld: 5.4 % (ref 4.8–5.6)
Mean Plasma Glucose: 108.28 mg/dL

## 2020-04-03 LAB — TRIGLYCERIDES: Triglycerides: 176 mg/dL — ABNORMAL HIGH (ref <150)

## 2020-04-03 LAB — SARS CORONAVIRUS 2 BY RT PCR (HOSPITAL ORDER, PERFORMED IN ~~LOC~~ HOSPITAL LAB): SARS Coronavirus 2: NEGATIVE

## 2020-04-03 MED ORDER — LACTATED RINGERS IV BOLUS: 500.0000 mL | Freq: Once | INTRAVENOUS | Status: DC

## 2020-04-03 MED ORDER — HYDROMORPHONE HCL 1 MG/ML IJ SOLN
0.5000 mg | INTRAMUSCULAR | Status: DC | PRN
  Administered 2020-04-03 – 2020-04-19 (×93): 0.5 mg via INTRAVENOUS
  Filled 2020-04-03: qty 0.5
  Filled 2020-04-03 (×3): qty 1
  Filled 2020-04-03: qty 0.5
  Filled 2020-04-03 (×4): qty 1
  Filled 2020-04-03: qty 0.5
  Filled 2020-04-03 (×2): qty 1
  Filled 2020-04-03 (×5): qty 0.5
  Filled 2020-04-03 (×3): qty 1
  Filled 2020-04-03: qty 0.5
  Filled 2020-04-03 (×3): qty 1
  Filled 2020-04-03 (×2): qty 0.5
  Filled 2020-04-03 (×3): qty 1
  Filled 2020-04-03: qty 0.5
  Filled 2020-04-03: qty 1
  Filled 2020-04-03 (×3): qty 0.5
  Filled 2020-04-03 (×3): qty 1
  Filled 2020-04-03 (×2): qty 0.5
  Filled 2020-04-03 (×2): qty 1
  Filled 2020-04-03 (×3): qty 0.5
  Filled 2020-04-03: qty 1
  Filled 2020-04-03 (×2): qty 0.5
  Filled 2020-04-03 (×2): qty 1
  Filled 2020-04-03 (×2): qty 0.5
  Filled 2020-04-03 (×2): qty 1
  Filled 2020-04-03 (×6): qty 0.5
  Filled 2020-04-03: qty 1
  Filled 2020-04-03 (×3): qty 0.5
  Filled 2020-04-03 (×3): qty 1
  Filled 2020-04-03 (×3): qty 0.5
  Filled 2020-04-03 (×6): qty 1
  Filled 2020-04-03: qty 0.5
  Filled 2020-04-03: qty 1
  Filled 2020-04-03 (×2): qty 0.5
  Filled 2020-04-03: qty 1
  Filled 2020-04-03 (×2): qty 0.5
  Filled 2020-04-03: qty 1
  Filled 2020-04-03 (×3): qty 0.5
  Filled 2020-04-03: qty 1
  Filled 2020-04-03: qty 0.5
  Filled 2020-04-03 (×3): qty 1
  Filled 2020-04-03 (×3): qty 0.5

## 2020-04-03 MED ORDER — LEVOTHYROXINE SODIUM 100 MCG/5ML IV SOLN
75.0000 ug | Freq: Every day | INTRAVENOUS | Status: DC
  Administered 2020-04-03 – 2020-04-10 (×8): 75 ug via INTRAVENOUS
  Filled 2020-04-03 (×6): qty 5

## 2020-04-03 MED ORDER — SODIUM CHLORIDE 0.9 % IV SOLN
INTRAVENOUS | Status: DC | PRN
  Administered 2020-04-03 – 2020-04-05 (×2): 250 mL via INTRAVENOUS

## 2020-04-03 MED ORDER — SODIUM CHLORIDE 0.9 % IV SOLN
2.0000 g | Freq: Three times a day (TID) | INTRAVENOUS | Status: DC
  Administered 2020-04-03 – 2020-04-19 (×48): 2 g via INTRAVENOUS
  Filled 2020-04-03 (×50): qty 2

## 2020-04-03 MED ORDER — ONDANSETRON HCL 4 MG PO TABS: 4.0000 mg | ORAL_TABLET | Freq: Four times a day (QID) | ORAL | Status: DC | PRN

## 2020-04-03 MED ORDER — ONDANSETRON HCL 4 MG/2ML IJ SOLN
4.0000 mg | Freq: Four times a day (QID) | INTRAMUSCULAR | Status: DC | PRN
  Administered 2020-04-03: 4 mg via INTRAVENOUS
  Filled 2020-04-03: qty 2

## 2020-04-03 MED ORDER — LACTATED RINGERS IV SOLN: INTRAVENOUS | Status: AC

## 2020-04-03 MED ORDER — CHLORHEXIDINE GLUCONATE CLOTH 2 % EX PADS
6.0000 | MEDICATED_PAD | Freq: Every day | CUTANEOUS | Status: DC
  Administered 2020-04-03 – 2020-04-18 (×12): 6 via TOPICAL

## 2020-04-03 MED ORDER — MAGNESIUM SULFATE 2 GM/50ML IV SOLN
2.0000 g | Freq: Once | INTRAVENOUS | Status: AC
  Administered 2020-04-03: 2 g via INTRAVENOUS
  Filled 2020-04-03: qty 50

## 2020-04-03 MED ORDER — INSULIN ASPART 100 UNIT/ML ~~LOC~~ SOLN
0.0000 [IU] | Freq: Four times a day (QID) | SUBCUTANEOUS | Status: DC
  Administered 2020-04-03 – 2020-04-05 (×5): 1 [IU] via SUBCUTANEOUS
  Administered 2020-04-05: 2 [IU] via SUBCUTANEOUS
  Administered 2020-04-05 – 2020-04-06 (×2): 1 [IU] via SUBCUTANEOUS
  Administered 2020-04-06: 2 [IU] via SUBCUTANEOUS
  Administered 2020-04-06 – 2020-04-10 (×4): 1 [IU] via SUBCUTANEOUS

## 2020-04-03 MED ORDER — CALCIUM GLUCONATE-NACL 1-0.675 GM/50ML-% IV SOLN
1.0000 g | Freq: Once | INTRAVENOUS | Status: AC
  Administered 2020-04-03: 1000 mg via INTRAVENOUS
  Filled 2020-04-03: qty 50

## 2020-04-03 MED ORDER — ORAL CARE MOUTH RINSE
15.0000 mL | Freq: Two times a day (BID) | OROMUCOSAL | Status: DC
  Administered 2020-04-03 – 2020-04-18 (×30): 15 mL via OROMUCOSAL

## 2020-04-03 MED ORDER — LACTATED RINGERS IV BOLUS
1000.0000 mL | Freq: Once | INTRAVENOUS | Status: AC
  Administered 2020-04-03: 1000 mL via INTRAVENOUS

## 2020-04-03 MED ORDER — VANCOMYCIN HCL 1250 MG/250ML IV SOLN
1250.0000 mg | Freq: Two times a day (BID) | INTRAVENOUS | Status: DC
  Administered 2020-04-03 – 2020-04-04 (×2): 1250 mg via INTRAVENOUS
  Filled 2020-04-03 (×3): qty 250

## 2020-04-03 NOTE — Progress Notes (Addendum)
S: Dry MM, Soft pressure, vomiting and Diarrhea x 3 days prior to admission O: Foley placed as no UO in > 8 hours      Total of 230 cc's concentrated urine per foley cath ( right at 30 cc/hr over           past 8 hours)       Lactate increase between 7 am and 9 am on 6/25 from  2.2>>2.6       HGB remains elevated at 16>> suspect Hemo concentrated  A: Dehydration , Increasing lactic Acid P: Additional 1 L of LR now as bolus      Trend Lactate closely       Strict I&O  We will continue to follow along with Primary Team  Bevelyn Ngo, MSN, AGACNP-BC [facility] Pulmonary/Critical Care Medicine See Amion for personal pager PCCM on call pager [phone] 04/03/2020 1:29 PM

## 2020-04-03 NOTE — Consult Note (Addendum)
NAME:  Amber May, MRN:  [id number], DOB:  June 25, 1981, LOS: 0 ADMISSION DATE:  04/02/2020, CONSULTATION DATE:  6/25 REFERRING MD:  Sarajane Jews, CHIEF COMPLAINT:   Acute hypoxic respiratory failure in setting of pancreatitis  Brief History   39 year old woman PMH hypothyroidism, morbid obesity presented with abdominal pain and fever.  Admitted 6/24 for severe acute pancreatitis, SIRS. She has had progressive hypoxic respiratory failure since admission with  increased work of breathing. PCCM have been consulted as a precaution  in the event patient has progressive decompensation of respiratory status, requiring intubation.  History of present illness   Amber May is a 39 y.o. female with a history of thyroid disease and obesity, no other significant history, admitted 6/24 with pancreatitis. Patient has had abdominal pain along with nausea / vomiting since Tuesday  6/22 ( x 3 days) . Symptoms have progressed, including  diaphoresis and fever since 6/23 , she presented to the hospital for care. She denies ever having pancreatitis or problems with her pancreas before. Her grandmother passed away from pancreatitis. She does not drink any alcohol. No prior surgical history.   ED presentation included WBC of 45, lactic acidosis. COVID negative. Lipase only mildly elevated to 144, AST 74, ALT 168, AP 130, T bil 4.0. Cr 1.98 on admission. CT scan done showing acute pancreatitis, hepatomegaly with steatosis, sludge / stones suspected in the gallbladder. No obvious biliary ductal dilation. She was given 4 L fluid bolus and then on 250cc/hr of LR. Given IV cefepime.   She remains tachycardic to low 100s, BP is soft, and she has   tachypnea , RR 40-50  , but she is maintaining sats of 95-95% at present. Mental status is clear. LFT's are down trending ( bil to 2.4, AP 96, ALT 101, AST 33),Cr improved 1.22, Ca at 6.4.  WBC at 39 today, Hgb went from 17.8 to 16.0 overnight. Lactic acidosis improving.  Troponin  is positive to 54. Trigs 176.   GI has been consulted and patient is scheduled for an MRCP within the hour to evaluate her biliary tree  PCCM have been consulted as a precaution in the event patient has progressive decompensation of respiratory status, requiring intubation.   Past Medical History    Past Medical History:  Diagnosis Date  . Obesity   . Thyroid disease     Significant Hospital Events   03/31/2020>> Symptoms started 04/02/2020>> Admission to hospital  Consults:  6/25 PCCM 6/25 GI  Procedures:    Significant Diagnostic Tests:  04/03/2020>> MRCP to rule out choledocholithiasis / cholangitis  6/24 CXR>>  Low lung volume with hypoventilatory change. Atelectasis at the right base. Borderline cardiomegaly augmented by low lung volume. No Pneumothorax.  6/25 MR Abdomen Limited study due to motion artifact and lack of intravenous contrast. Patient was unable to complete the entire exam. 2. Cholelithiasis with apparent gallbladder wall thickening. No intrahepatic or extrahepatic biliary duct dilatation. No definite choledocholithiasis. If there is clinical concern for acute cholecystitis, abdominal ultrasound or nuclear scintigraphy may prove helpful to further evaluate. 3. Mild edema in the region of the pancreatic head. No main duct dilatation. Imaging features may be related to pancreatitis. 4. Hepatomegaly with steatosis.  5. Moderate volume intraperitoneal free fluid. 6. Bilateral pleural effusions associated with bibasilar collapse/consolidative changes.   Micro Data:  04/02/2020 Blood Cultures>> 04/02/2020 Urine>> 04/02/2020  SARS Coronavirus 2 NEGATIVE NEGATIVE       Antimicrobials:  Cefepime 04/02/2020>> Vancomycin 04/03/2020>>  Flagyl 04/02/2020 x 1  dose in ED  Interim history/subjective:  States she has continued nausea and abdominal epigastric  pain She states she feels short of breath but feels she is managing OK right now on oxygen For MRCP  this morning.  Currently on 4 L with sats of 94%. RR 36 , Accessory muscle use noted on exam T max 100.4 WBC 39.3 Procalcitonin 1.45 Lactic Acid 2.6 ( Slight increase since last draw) + 2800 cc's since admission Was given total of 4 L in the ED Currently LR at  250/hr For MRCP to rule out choledocholithiasis / cholangitis    Objective   Blood pressure 137/88, pulse (!) 116, temperature 97.9 F (36.6 C), temperature source Oral, resp. rate (!) 42, height _0  (1.575 m), weight (!) 140.9 kg, last menstrual period 03/09/2020, SpO2 94 %.        Intake/Output Summary (Last 24 hours) at 04/03/2020 1030 Last data filed at 04/03/2020 0012 Gross per 24 hour  Intake 2804.42 ml  Output --  Net 2804.42 ml   Filed Weights   04/02/20 1813 04/03/20 0100  Weight: (!) 147.4 kg (!) 140.9 kg    Examination: General: Awake and alert obese female supine in bed in mild respiratory distress HENT: NCAT, MM dry,  No icterus. Conjunctiva pink Lungs: Bilateral chest excursion, shallow rapid respirations, clear and diminished at present Cardiovascular: S1, S2, RRR, ST per tele Abdomen: Obese, soft, epigastric tenderness  to palpation , BS + Extremities: No edema, no obvious deformities, cap refill > 3 seconds Neuro: Grossly intat, A&O x 3, MAE x 4, follows all commands GU: Not assessed  Resolved Hospital Problem list     Assessment & Plan:  Acute Hypoxic Respiratory failure in setting of pancreatitis  CXR with atelectasis Right base Bilateral pleural effusions associated with bibasilar collapse/consolidative changes per abdominal MRCP. Plan Titrate oxygen to maintain oxygen saturations > 92% CXR today , in am and prn Aggressive Pulmonary Toilet IS Q1 while awake OOB when stable enough to do so ABG prn Trend mag with goal of > 2  Acute Pancreatitis with SIRS, appears uncomplicated per CT, but severe clinical features ( Fever, Tachypnea, Lactic Acidosis, Leukocytosis, AKI, Elevated  LFT's) Triglycerides slightly elevated Transaminases and total bilirubin elevated on admission/  imaging suggested possible stones in GB  Suspect gallstone pancreatitis Alkaline phosphatase, AST have normalized.  ALT and total bilirubin trending down.  + Cholelithiasis per MRCP Plan Per GI Continue Empiric Antibiotics Follow  Micro Re-culture as is clinically indicated Continue LR at 250 per hour per primary team Foley cath now for strict I&O Trend Alk Phos, LFT's Total Bili, lactate MRCP per GI this am to RO choledocholithiasis / cholangitis If + may need percutaneous drain ( may be too unstable for ERCP), and eventual choley>> Per GI [place] of abdomen to better evaluate GB   Lactic acidosis initially trending down, but slight bump 6/25/am Received 4 l in the ED Leukocytosis Plan Continue LR at 250/ hr per Primary Team Consider additional Liter of IVF bolus now Trend Lactate until clear Continue Empiric Antibiotics  Trend Pro calcitonin Trend CBC/ fever curve   Acute Kidney Injury Creatinine down trending Plan Trend BMET Replete electrolytes as needed Maintain renal perfusion  Ascites 2/2 acute pancreatitis Plan Follow for resolution/ worsening Per GI  Hypo Mag Goal > 2 Plan Replete 6/25 Trend and replete electrolytes  Daily as needed     Best practice:  Diet: NPO Pain/Anxiety/Delirium protocol (if indicated): NA VAP protocol (if indicated): NA  DVT prophylaxis: PAS hose GI prophylaxis: Per GI Glucose control: CBG and SSI Mobility: BR Code Status: Full Family Communication: Pt.updated at bedside 6/25 Disposition: Step Down  Labs   CBC: Recent Labs  Lab 04/02/20 1847 04/03/20 0709  WBC 45.6* 39.3*  NEUTROABS 38.2* 34.2*  HGB 17.8* 16.0*  HCT 56.6* 50.5*  MCV 82.3 83.1  PLT 534* 267    Basic Metabolic Panel: Recent Labs  Lab 04/02/20 1847 04/03/20 0709  NA 136 135  K 3.6 4.1  CL 100 106  CO2 17* 18*  GLUCOSE 228* 172*  BUN 20 21*   CREATININE 1.98* 1.22*  CALCIUM 7.8* 6.4*   GFR: Estimated Creatinine Clearance: 82.8 mL/min (A) (by C-G formula based on SCr of 1.22 mg/dL (H)). Recent Labs  Lab 04/02/20 1847 04/02/20 2129 04/03/20 0709 04/03/20 0938  PROCALCITON  --   --  1.45  --   WBC 45.6*  --  39.3*  --   LATICACIDVEN 3.9* 3.3* 2.2* 2.6*    Liver Function Tests: Recent Labs  Lab 04/02/20 1847 04/03/20 0709  AST 74* 33  ALT 168* 101*  ALKPHOS 130* 96  BILITOT 4.0* 2.4*  PROT 7.7 5.8*  ALBUMIN 3.8 2.8*   Recent Labs  Lab 04/02/20 1847  LIPASE 154*   No results for input(s): AMMONIA in the last 168 hours.  ABG No results found for: PHART, PCO2ART, PO2ART, HCO3, TCO2, ACIDBASEDEF, O2SAT   Coagulation Profile: No results for input(s): INR, PROTIME in the last 168 hours.  Cardiac Enzymes: No results for input(s): CKTOTAL, CKMB, CKMBINDEX, TROPONINI in the last 168 hours.  HbA1C: Hgb A1c MFr Bld  Date/Time Value Ref Range Status  04/03/2020 07:09 AM 5.4 4.8 - 5.6 % Final    Comment:    (NOTE) Pre diabetes:          5.7%-6.4%  Diabetes:              >6.4%  Glycemic control for   <7.0% adults with diabetes     CBG: Recent Labs  Lab 04/03/20 0905  GLUCAP 154*    Review of Systems:    Gen: + fever, + chills,  No weight change, + fatigue, night sweats HEENT: Denies blurred vision, double vision, hearing loss, tinnitus, sinus congestion, rhinorrhea, sore throat, neck stiffness, dysphagia PULM: +  shortness of breath, No cough, sputum production, hemoptysis, wheezing CV: Denies chest pain, edema, orthopnea, paroxysmal nocturnal dyspnea, palpitations GI: + Epigastric tenderness, +  abdominal pain,+  nausea, + vomiting, diarrhea, hematochezia, melena, constipation, change in bowel habits GU: Denies dysuria, hematuria, polyuria, oliguria, urethral discharge Endocrine: Denies hot or cold intolerance, polyuria, polyphagia or appetite change Derm: Denies rash, dry skin, scaling or  peeling skin change Heme: Denies easy bruising, bleeding, bleeding gums Neuro: Denies headache, numbness, weakness, slurred speech, loss of memory or consciousness  Past Medical History  She,  has a past medical history of Obesity and Thyroid disease.   Surgical History   History reviewed. No pertinent surgical history.   Social History   reports that she has never smoked. She has never used smokeless tobacco. She reports previous alcohol use. She reports previous drug use.   Family History   Her family history includes Diabetes Mellitus II in her maternal grandmother.   Allergies No Known Allergies   Home Medications  Prior to Admission medications   Medication Sig Start Date End Date Taking? Authorizing Provider  levothyroxine (SYNTHROID) 125 MCG tablet Take 125 mcg by mouth daily before  breakfast.   Yes [provider]     Critical care time: 48 minutes   Magdalen Spatz, MSN, AGACNP-BC [place] for personal pager PCCM on call pager [phone] 04/03/2020 10:31 AM

## 2020-04-03 NOTE — Consult Note (Addendum)
Consultation  Referring Provider:     Brendia Sacks MD Primary Care Physician:  Patient, No Pcp Per Primary Gastroenterologist:       Gentry Fitz  Reason for Consultation:     pancreatitis         HPI:   Amber May is a 39 y.o. female with a history of thyroid disease and obesity, no other significant history, admitted with pancreatitis. Patient has had abdominal pain along with nausea / vomiting since Tuesday. Symptoms have progressed, having diaphoresis and felt febrile last night and came to the hospital. She denies ever having pancreatitis or problems with her pancreas before. Her grandmother passed away from pancreatitis. She does not drink any alcohol. No prior surgical history.   She presented to the ED last night with a WBC of 45, lactic acidosis. COVID negative. Lipase only mildly elevated to 144, AST 74, ALT 168, AP 130, T bil 4.0. Cr 1.98 on admission. CT scan done showing acute pancreatitis, hepatomegaly with steatosis, sludge / stones suspected in the gallbladder. No obvious biliary ductal dilation. She was given 4 L fluid bolus and then on 250cc/hr of LR. Given IV cefepime. She remains tachycardic to low 100s, while BP is okay, and some tachypnea with good oxygenation however.   LFTs today are improved, bil to 2.4, AP 96, ALT 101, AST 33. MRCP ordered and pending, per nursing to be done within the next hour. Cr improved 1.22, Ca at 6.4.  WBC at 39 today, Hgb went from 17.8 to 16.0 overnight. Lactic acidosis improving.  Troponin is positive to 54. Trigs 176.     Past Medical History:  Diagnosis Date  . Obesity   . Thyroid disease     History reviewed. No pertinent surgical history.  Family History  Problem Relation Age of Onset  . Diabetes Mellitus II Maternal Grandmother      Social History   Tobacco Use  . Smoking status: Never Smoker  . Smokeless tobacco: Never Used  Substance Use Topics  . Alcohol use: Not Currently  . Drug use: Not Currently     Prior to Admission medications   Medication Sig Start Date End Date Taking? Authorizing Provider  levothyroxine (SYNTHROID) 125 MCG tablet Take 125 mcg by mouth daily before breakfast.   Yes [provider]    Current Facility-Administered Medications  Medication Dose Route Frequency Provider Last Rate Last Admin  . calcium gluconate 1 g/ 50 mL sodium chloride IVPB  1 g Intravenous Once Standley Brooking, MD      . ceFEPIme (MAXIPIME) 2 g in sodium chloride 0.9 % 100 mL IVPB  2 g Intravenous Q12H Eduard Clos, MD   Stopped at 04/03/20 0630  . Chlorhexidine Gluconate Cloth 2 % PADS 6 each  6 each Topical Daily Standley Brooking, MD   6 each at 04/03/20 [phone]  . HYDROmorphone (DILAUDID) injection 0.5 mg  0.5 mg Intravenous Q2H PRN Eduard Clos, MD      . insulin aspart (novoLOG) injection 0-6 Units  0-6 Units Subcutaneous Q6H Standley Brooking, MD   1 Units at 04/03/20 [phone]  . lactated ringers infusion   Intravenous Continuous Eduard Clos, MD 250 mL/hr at 04/03/20 0076 New Bag at 04/03/20 2263  . levothyroxine (SYNTHROID, LEVOTHROID) injection 75 mcg  75 mcg Intravenous Daily Eduard Clos, MD   75 mcg at 04/03/20 3354  . MEDLINE mouth rinse  15 mL Mouth Rinse BID Standley Brooking, MD  15 mL at 04/03/20 0949  . ondansetron (ZOFRAN) tablet 4 mg  4 mg Oral Q6H PRN Eduard Clos, MD       Or  . ondansetron [facility]) injection 4 mg  4 mg Intravenous Q6H PRN Eduard Clos, MD      . vancomycin (VANCOREADY) IVPB 1750 mg/350 mL  1,750 mg Intravenous Q24H Eduard Clos, MD        Allergies as of 04/02/2020  . (No Known Allergies)     Review of Systems:    As per HPI, otherwise negative    Physical Exam:  Vital signs in last 24 hours: Temp:  [97.7 F (36.5 C)-100.4 F (38 C)] 97.9 F (36.6 C) (06/25 0800) Pulse Rate:  [101-139] 116 (06/25 0800) Resp:  [0-57] 42 (06/25 0800) BP: (85-143)/(60-109) 137/88 (06/25  0800) SpO2:  [92 %-97 %] 94 % (06/25 0800) Weight:  [140.9 kg-147.4 kg] 140.9 kg (06/25 0100)   General:   Pleasant female  Head:  Normocephalic and atraumatic. Eyes:   No icterus.   Conjunctiva pink. Ears:  Normal auditory acuity. Neck:  Supple Lungs:  Respirations even and unlabored but tachypnic Heart:  Sinus tachy to low 100s Abdomen:  Soft, nondistended, obese abdomen, epigastric tenderness.  Rectal:  Not performed.  Msk:  Symmetrical without gross deformities.  Extremities:  Without edema. Neurologic:  Alert and  oriented x4;  grossly normal neurologically. Skin:  Intact without significant lesions or rashes. Psych:  Alert and cooperative. Normal affect.  LAB RESULTS: Recent Labs    04/02/20 1847 04/03/20 0709  WBC 45.6* 39.3*  HGB 17.8* 16.0*  HCT 56.6* 50.5*  PLT 534* 333   BMET Recent Labs    04/02/20 1847 04/03/20 0709  NA 136 135  K 3.6 4.1  CL 100 106  CO2 17* 18*  GLUCOSE 228* 172*  BUN 20 21*  CREATININE 1.98* 1.22*  CALCIUM 7.8* 6.4*   LFT Recent Labs    04/03/20 0709  PROT 5.8*  ALBUMIN 2.8*  AST 33  ALT 101*  ALKPHOS 96  BILITOT 2.4*  BILIDIR 1.3*  IBILI 1.1*   PT/INR No results for input(s): LABPROT, INR in the last 72 hours.  STUDIES: CT ABDOMEN PELVIS WO CONTRAST  Result Date: 04/02/2020 CLINICAL DATA:  Abdominal distension. EXAM: CT ABDOMEN AND PELVIS WITHOUT CONTRAST TECHNIQUE: Multidetector CT imaging of the abdomen and pelvis was performed following the standard protocol without IV contrast. COMPARISON:  None. FINDINGS: Lower chest: Motion artifact through the lung bases. Elevation of right hemidiaphragm with compressive atelectasis. Possible trace right pleural effusion. Hepatobiliary: Enlarged liver spanning 20 cm cranial caudal with mild steatosis. No evidence of discrete focal lesion. Motion limits assessment for capsular nodularity. Layering hyperdensity in the gallbladder. Ascites adjacent to the gallbladder which is similar  to the ascites adjacent to the liver. Common bile duct is not well-defined, there is no obvious biliary dilatation. Pancreas: Peripancreatic fat stranding and edema adjacent to the head and uncinate process. Small amount of non organized peripancreatic fluid. No ductal dilatation. No evidence of acute peripancreatic collection. Cannot assess for necrosis in the absence of IV contrast. Spleen: Normal in size without focal abnormality. Adrenals/Urinary Tract: Normal adrenal glands. No renal stones or hydronephrosis. Both ureters are decompressed without stones along the course. Urinary bladder is nondistended. Stomach/Bowel: Fluid/ingested material in the stomach. Mild duodenal wall thickening in the fourth portion adjacent to the pancreatic inflammation. There is no small bowel obstruction or inflammatory change. Detailed bowel assessment is  limited in the absence of contrast and presence of abdominal ascites. The majority of the colon is decompressed. Vascular/Lymphatic: Normal caliber abdominal aorta. Few prominent periportal lymph nodes are likely reactive. Reproductive: Abnormal low-density in the lower uterine segment/cervix spanning 18 mm, nonspecific. No evidence of adnexal mass. Other: Small to moderate volume of abdominopelvic ascites. There is free fluid in both upper quadrants, both pericolic gutters, mesentery, and tracking into the pelvis. No free air or organized fluid collection. Small fat containing umbilical hernia. Musculoskeletal: Degenerative change in the spine. There are no acute or suspicious osseous abnormalities. IMPRESSION: 1. Acute edematous pancreatitis. No evidence of acute peripancreatic collection. 2. Hepatomegaly and hepatic steatosis. Layering hyperdensity in the gallbladder may represent sludge or layering stones. 3. Small to moderate volume of abdominopelvic ascites. This may be in part related to pancreatitis, underlying liver disease, or combination thereof. 4. Abnormal  low-density in the lower uterine segment/cervix spanning 18 mm, nonspecific. Recommend nonemergent pelvic ultrasound for characterization on an elective outpatient basis. Electronically Signed   By: Narda Rutherford M.D.   On: 04/02/2020 20:06   DG Chest Port 1 View  Result Date: 04/02/2020 CLINICAL DATA:  Sepsis EXAM: PORTABLE CHEST 1 VIEW COMPARISON:  None. FINDINGS: Low lung volume with hypoventilatory change. Atelectasis at the right base. Borderline cardiomegaly augmented by low lung volume. No pneumothorax. IMPRESSION: Low lung volume with atelectasis at the right base. Electronically Signed   By: Jasmine Pang M.D.   On: 04/02/2020 20:08       Impression / Plan:  39 y/o female with no significant history other than obesity, admitted with severe pancreatitis, likely biliary in origin given her labs and gallstones / sludge noted on imaging. She does not drink alcohol, trigs normal, no new meds. Given presentation thus far she is at risk for a severe course with complications from pancreatitis. She has a marked leukocytosis, need to rule out choledocholithiasis / cholangitis initially. Agree with evaluating her biliary tree as soon as possible with MRCP or [place]. The primary service has ordered an MRCP and per nursing will be done in < 45 min so will get that result back soon, if this is delayed in any way please contact me and would order stat US otherwise. With her LAEs improved, it's possible she passed a stone. If she does have choledocholithiasis, she may need percutaneous drainage if she is not stable enough for ERCP, will discuss with my advanced endoscopy colleagues if this is noted. Otherwise continue supportive care with agressive IVF resuscitation (LR at 250cc/hr currently following multiple boluses), continue empiric antibiotics, lactic acidosis is improving, trend labs. Troponin just came back positive, suspect from demand ischemia, will need EKG and to trend this. Hypocalcemia noted, can be  associated with severe pancreatitis and poor prognostic indicator, would check magnesium level as well. Patient counseled she will warrant surgical evaluation for cholecystectomy at some point during her hospitalization. Answered all questions from the patient and her mother.  Will await MRCP findings shortly with additional recommendations. Please contact me with questions in the interim.   Ileene Patrick, MD [facility]   UPDATE: MRCP done which was somewhat limited but no obvious choledocholithiasis or biliary ductal dilation. [place] then done to further evaluate for any obvious choledocholithiasis and none seen, with very mild CBD dilation. Gallstones noted in GB and question of cholecystitis. LFTs are downtrending, I think choledocholithiasis less likely given imaging workup between CT, MRCP, and [place]. She is critically ill with severe pancreatitis. Continue supportive care,  Dr. Benson Norway will assume her GI care this weekend, I will update him on her case this evening. Surgery will need to be involved at some point for cholecystectomy but she is not stable for that right now.    Addendum: Patient has started having fevers this evening, continues to be tachycardic, significant inflammatory response, on broad spectrum antibiotics. Have discussed her case with cross cover Dr. Hal Hope, discussed consideration for HIDA scan to better assess for possible cholecystitis, although unfortunately that study is not available this weekend (supply?). We have asked general surgery to weigh in on her case in regards to her gallbladder / imaging findings, possibility of cholecystitis contributing to this picture. She is at high risk for complications from pancreatitis. Our service will continue to follow closely, Dr. Benson Norway to assume her GI care this weekend

## 2020-04-03 NOTE — Progress Notes (Signed)
CRITICAL VALUE ALERT  Critical Value:  Lactic 2.2, Ca 6.4  Date & Time Notied:  6/25 0808  Provider Notified: Dr. Irene Limbo [phone]  Orders Received/Actions taken: no new orders, MD to take a look at other labs

## 2020-04-03 NOTE — Progress Notes (Signed)
Lactate is 2.2 on 6/25 at 13:30  >> from 2.6 at 7 am CXR  04/03/2020 T 1330 >> Hazy and streaky bibasilar opacities suggesting small bilateral pleural effusions with bibasilar atelectasis>> + 3900 cc's since admission Will drop LR  to 125/Hr  Will re-check Lactate at 1800 to ensure continued decline to normal after LR has been decreased. If increases at 1800 draw, consider additional IVF  Bevelyn Ngo, MSN, AGACNP-BC [facility] Pulmonary/Critical Care Medicine See [place] for personal pager PCCM on call pager [phone] 04/03/2020 2:52 PM

## 2020-04-03 NOTE — Progress Notes (Signed)
Pharmacy Antibiotic Note  Amber May is a 39 y.o. female admitted on 04/02/2020 with abdominal pain and subjective fever concerning for infection of unknown source.  Pharmacy has been consulted for Cefepime and vancomycin dosing. LA 3.9.   Plan: Increase Cefepime to 2gm q8h Vancomycin 2 gm once followed by1250 mg q12h  Height: 5\' 2"  (157.5 cm) Weight: (!) 140.9 kg (310 lb 10.1 oz) IBW/kg (Calculated) : 50.1  Temp (24hrs), Avg:98.4 F (36.9 C), Min:97.7 F (36.5 C), Max:100.4 F (38 C)  Recent Labs  Lab 04/02/20 1847 04/02/20 2129 04/03/20 0709 04/03/20 0938  WBC 45.6*  --  39.3*  --   CREATININE 1.98*  --  1.22*  --   LATICACIDVEN 3.9* 3.3* 2.2* 2.6*    Estimated Creatinine Clearance: 82.8 mL/min (A) (by C-G formula based on SCr of 1.22 mg/dL (H)).    No Known Allergies  Antimicrobials this admission: Cefepime 6/24 >>  Vancomycin 6/24 >>   Dose adjustments this admission: 6/25 Cefepime 2gm q12h > q8h 6/25 Vancomycin 1750mg  q24h > 1250mg  q12H  Microbiology results: 6/24 BCx: sent 6/24 UCx: sent    Thank you for allowing pharmacy to be a part of this patient's care.  PharmD 04/03/2020, 11:58 AM

## 2020-04-03 NOTE — Progress Notes (Addendum)
PROGRESS NOTE  Amber May [id number]:[id number] DOB: [birth year] DOA: 04/02/2020 PCP: Patient, No Pcp Per  Brief History   39 year old woman PMH hypothyroidism, morbid obesity presented with abdominal pain and fever.  Admitted for severe acute pancreatitis, SIRS.  A & P  Acute pancreatitis with SIRS uncomplicated by CT but with severe clinical features including fever, acute hypoxic respiratory failure, tachypnea, lactic acidosis, leukocytosis, acute kidney injury, ascites, elevated LFTs. --No definite infection at this point but given clinical picture continue empiric antibiotics.  Follow-up culture data.  Rule out sepsis.  Lactic acid trending down. --Triglycerides only modestly elevated.  No alcohol use.  Transaminases and total bilirubin were elevated on admission and imaging suggested possible stones in the gallbladder.  Suspect gallstone pancreatitis.  Alkaline phosphatase, AST have normalized.  ALT and total bilirubin trending down. --Continue aggressive IV fluids, empiric antibiotics, strict I's/O, monitor volume status closely. --Gastroenterology consultation pending.  MRCP when stable.  Eventually likely will need cholecystectomy. --She appears critically ill, and I have concerns she will decompensate, will consult pulmonary critical care medicine for further assistance  Hypocalcemia --Replete.  Acute kidney injury secondary to acute pancreatitis --Improved with aggressive IV fluids.  Follow BMP.  Continue IV fluids.  Hyperglycemia secondary to acute pancreatitis.  Hemoglobin A1c 5.4. --Sliding scale insulin.  Hepatomegaly and hepatic steatosis.  Likely related to morbid obesity. --Follow-up as an outpatient with gastroenterology.  Abnormal low-density in the lower uterine segment/cervix spanning 18 mm, nonspecific. Recommend nonemergent pelvic ultrasound for characterization on an elective outpatient basis.  Morbid obesity. Body mass index is 56.81 kg/m. --Dietitian consult  once stable.  Disposition Plan:  Discussion: Patient is critically ill secondary to acute pancreatitis, with tachypnea and new hypoxic respiratory failure on 4 L.  Continue empiric antibiotics, fluids, close monitoring in stepdown unit.  Follow-up gastroenterology and critical care medicine consultation and recommendations.  Status is: Inpatient  Remains inpatient appropriate because:IV treatments appropriate due to intensity of illness or inability to take PO   Dispo: The patient is from: Home              Anticipated d/c is to: Home              Anticipated d/c date is: > 3 days              Patient currently is not medically stable to d/c.   DVT prophylaxis:  SCDs Start: 04/03/20 0534   Code Status: Full Family Communication: mother at bedside  Murray Hodgkins, MD  [facility] Direct contact: see [email or web address] (further directions at bottom of note if needed) 7PM-7AM contact night coverage as at bottom of note 04/03/2020, 9:39 AM  LOS: 0 days   Significant Hospital Events   . 6/25 admitted for severe pancreatitis, fever   Consults:  . Gastroenterology . Critical care medicine   Procedures:  .   Significant Diagnostic Tests:   6/25 CT abdomen pelvis acute edematous pancreatitis.  No acute peripancreatic collection.  Layering hyperdensity in the gallbladder may be represent sludge or stones.  Small to moderate volume ascites.  Hepatomegaly.  Hepatic steatosis.   Micro Data:  . 6/24 blood cultures . 6/24 urine culture   Antimicrobials:  . Cefepime > . Vancomycin >  Interval History/Subjective  Reports epigastric abdominal pain.  Breathing okay.  Fever at home for approximately 2 days.  Symptoms began 6/22.  Mother at bedside.  Objective   Vitals:  Vitals:   04/03/20 0630 04/03/20 0800  BP:  137/88  Pulse: (!) 115 (!) 116  Resp: (!) 46 (!) 42  Temp:  97.9 F (36.6 C)  SpO2: 93% 94%    Exam:  Constitutional.  Appears calm, uncomfortable, ill but not  toxic. Respiratory.  Clear to auscultation bilaterally.  No wheezes, rales or rhonchi.  Moderate increased respiratory effort.  No retractions.  Tachypneic in the 40s-50s.  On 4 L nasal cannula with SPO2 in the mid 90s. Cardiovascular.  Tachycardic, regular rhythm.  No murmur, rub or gallop.  No lower extremity edema. Abdomen.  Obese.  Soft, nondistended, mild epigastric tenderness.  Skin appears unremarkable. Skin.  Appears grossly unremarkable.  Perfusion lower extremities appears intact. Psychiatric.  Grossly normal mood and affect.  Speech fluent and appropriate.  I have personally reviewed the following:   Today's Data  . Creatinine improved to 1.22.  Anion gap within normal limits.  ALT trending down.  AST has normalized.  Alkaline phosphatase is normalized.  Total bilirubin down to 2.4. . Triglycerides 176. . High-sensitivity troponin 54.  Not clear why this was ordered, no cardiac symptoms.  No further evaluation suggested. . Lactic acid trending down: 3.9, 3.3, 2.2.  Procalcitonin 1.45. . WBC down to 39.6. . Hemoglobin A1c 5.4.  TSH within normal limits. .   Scheduled Meds: . Chlorhexidine Gluconate Cloth  6 each Topical Daily  . insulin aspart  0-6 Units Subcutaneous Q6H  . levothyroxine  75 mcg Intravenous Daily  . mouth rinse  15 mL Mouth Rinse BID   Continuous Infusions: . ceFEPime (MAXIPIME) IV Stopped (04/03/20 0630)  . lactated ringers 250 mL/hr at 04/03/20 0635  . vancomycin      Principal Problem:   Acute pancreatitis Active Problems:   SIRS (systemic inflammatory response syndrome) (HCC)   AKI (acute kidney injury) (HCC)   Hypothyroidism   Hyperglycemia   LOS: 0 days   How to contact the [facility] Attending or Consulting provider 7A - 7P or covering provider during after hours 7P -7A, for this patient?  1. Check the care team in [facility] and look for a) attending/consulting [facility] provider listed and b) the [facility] team listed 2. Log into [email or web address] and use [place]'s  universal password to access. If you do not have the password, please contact the hospital operator. 3. Locate the [facility] provider you are looking for under [facility] and page to a number that you can be directly reached. 4. If you still have difficulty reaching the provider, please page the [facility] (Director on Call) for the Hospitalists listed on amion for assistance.

## 2020-04-03 NOTE — H&P (Addendum)
History and Physical    Amber May GYF:[id number] DOB: Dec 28, 1980 DOA: 04/02/2020  PCP: Patient, No Pcp Per  Patient coming from: Home.  Chief Complaint: Abdominal pain.  HPI: Amber May is a 39 y.o. female with history of hypothyroidism morbid obesity presents to the ER admits that [facility] with complaints of abdominal pain.  Patient has been abdominal pain with nausea vomiting for the last 3 days.  Pain is radiating to the back sharp in nature with no associated shortness of breath chest pain.  Has been having some diaphoresis.  Denies any diarrhea.  Denies drinking alcohol.   ED Course: Labs are significant for WBC of 45.6 lactic acid 3.9 blood glucose of 228 bicarb of 17 anion gap of 19 Covid test was negative chest x-ray unremarkable EKG shows sinus tachycardia.  Lipase was 144 AST 74 ALT 168 total bilirubin 4.  CT abdomen pelvis shows features concerning for acute edematous pancreatitis with some acetic fluid which may be from the pancreatitis.  ER physician discussed with on-call gastroenterologist Dr. Rhea Belton who advised hydration and pain relief medication.  Patient admitted for acute pancreatitis with SIRS picture.  Given that patient has significant leukocytosis antibiotics were started after blood cultures were obtained.  Review of Systems: As per HPI, rest all negative.   Past Medical History:  Diagnosis Date  . Obesity   . Thyroid disease     History reviewed. No pertinent surgical history.   reports that she has never smoked. She has never used smokeless tobacco. She reports previous alcohol use. She reports previous drug use.  No Known Allergies  Family History  Problem Relation Age of Onset  . Diabetes Mellitus II Maternal Grandmother     Prior to Admission medications   Medication Sig Start Date End Date Taking? Authorizing Provider  levothyroxine (SYNTHROID) 125 MCG tablet Take 125 mcg by mouth daily before breakfast.   Yes [provider]      Physical Exam: Constitutional: Moderately built and nourished. Vitals:   04/03/20 0200 04/03/20 0400 04/03/20 0430 04/03/20 0500  BP: (!) 143/62 135/88    Pulse: (!) 107 (!) 109 (!) 139 (!) 118  Resp: (!) 33 (!) 45 (!) 53 (!) 51  Temp: 97.8 F (36.6 C) 97.9 F (36.6 C)    TempSrc: Oral Oral    SpO2: 97% 95% 92% 93%  Weight:      Height:       Eyes: Anicteric no pallor. ENMT: No discharge from the ears eyes nose or mouth. Neck: No mass felt.  No neck rigidity. Respiratory: No rhonchi or crepitations. Cardiovascular: S1-S2 heard. Abdomen: Epigastric tenderness no guarding or rigidity.  No discoloration. Musculoskeletal: No edema. Skin: No rash. Neurologic: Alert awake oriented time place and person.  Moves all extremities. Psychiatric: Appears normal per normal affect.   Labs on Admission: I have personally reviewed following labs and imaging studies  CBC: Recent Labs  Lab 04/02/20 1847  WBC 45.6*  NEUTROABS 38.2*  HGB 17.8*  HCT 56.6*  MCV 82.3  PLT 534*   Basic Metabolic Panel: Recent Labs  Lab 04/02/20 1847  NA 136  K 3.6  CL 100  CO2 17*  GLUCOSE 228*  BUN 20  CREATININE 1.98*  CALCIUM 7.8*   GFR: Estimated Creatinine Clearance: 51 mL/min (A) (by C-G formula based on SCr of 1.98 mg/dL (H)). Liver Function Tests: Recent Labs  Lab 04/02/20 1847  AST 74*  ALT 168*  ALKPHOS 130*  BILITOT 4.0*  PROT 7.7  ALBUMIN 3.8   Recent Labs  Lab 04/02/20 1847  LIPASE 154*   No results for input(s): AMMONIA in the last 168 hours. Coagulation Profile: No results for input(s): INR, PROTIME in the last 168 hours. Cardiac Enzymes: No results for input(s): CKTOTAL, CKMB, CKMBINDEX, TROPONINI in the last 168 hours. BNP (last 3 results) No results for input(s): PROBNP in the last 8760 hours. HbA1C: No results for input(s): HGBA1C in the last 72 hours. CBG: No results for input(s): GLUCAP in the last 168 hours. Lipid Profile: No results for input(s):  CHOL, HDL, LDLCALC, TRIG, CHOLHDL, LDLDIRECT in the last 72 hours. Thyroid Function Tests: No results for input(s): TSH, T4TOTAL, FREET4, T3FREE, THYROIDAB in the last 72 hours. Anemia Panel: No results for input(s): VITAMINB12, FOLATE, FERRITIN, TIBC, IRON, RETICCTPCT in the last 72 hours. Urine analysis: No results found for: COLORURINE, APPEARANCEUR, LABSPEC, PHURINE, GLUCOSEU, HGBUR, BILIRUBINUR, KETONESUR, PROTEINUR, UROBILINOGEN, NITRITE, LEUKOCYTESUR Sepsis Labs: @LABRCNTIP (procalcitonin:4,lacticidven:4) ) Recent Results (from the past 240 hour(s))  SARS Coronavirus 2 by RT PCR (hospital order, performed in [facility] hospital lab) Nasopharyngeal Nasopharyngeal Swab     Status: None   Collection Time: 04/02/20 11:13 PM   Specimen: Nasopharyngeal Swab  Result Value Ref Range Status   SARS Coronavirus 2 NEGATIVE NEGATIVE Final    Comment: (NOTE) SARS-CoV-2 target nucleic acids are NOT DETECTED.  The SARS-CoV-2 RNA is generally detectable in upper and lower respiratory specimens during the acute phase of infection. The lowest concentration of SARS-CoV-2 viral copies this assay can detect is 250 copies / mL. A negative result does not preclude SARS-CoV-2 infection and should not be used as the sole basis for treatment or other patient management decisions.  A negative result may occur with improper specimen collection / handling, submission of specimen other than nasopharyngeal swab, presence of viral mutation(s) within the areas targeted by this assay, and inadequate number of viral copies (<250 copies / mL). A negative result must be combined with clinical observations, patient history, and epidemiological information.  Fact Sheet for Patients:   04/04/20  Fact Sheet for Healthcare Providers: BoilerBrush.com.cy  This test is not yet approved or  cleared by the https://pope.com/ FDA and has been authorized for detection  and/or diagnosis of SARS-CoV-2 by FDA under an Emergency Use Authorization (EUA).  This EUA will remain in effect (meaning this test can be used) for the duration of the COVID-19 declaration under Section 564(b)(1) of the Act, 21 U.S.C. section 360bbb-3(b)(1), unless the authorization is terminated or revoked sooner.  Performed at [facility], [address] [place], [place]      Radiological Exams on Admission: CT ABDOMEN PELVIS WO CONTRAST  Result Date: 04/02/2020 CLINICAL DATA:  Abdominal distension. EXAM: CT ABDOMEN AND PELVIS WITHOUT CONTRAST TECHNIQUE: Multidetector CT imaging of the abdomen and pelvis was performed following the standard protocol without IV contrast. COMPARISON:  None. FINDINGS: Lower chest: Motion artifact through the lung bases. Elevation of right hemidiaphragm with compressive atelectasis. Possible trace right pleural effusion. Hepatobiliary: Enlarged liver spanning 20 cm cranial caudal with mild steatosis. No evidence of discrete focal lesion. Motion limits assessment for capsular nodularity. Layering hyperdensity in the gallbladder. Ascites adjacent to the gallbladder which is similar to the ascites adjacent to the liver. Common bile duct is not well-defined, there is no obvious biliary dilatation. Pancreas: Peripancreatic fat stranding and edema adjacent to the head and uncinate process. Small amount of non organized peripancreatic fluid. No ductal dilatation.  No evidence of acute peripancreatic collection. Cannot assess for necrosis in the absence of IV contrast. Spleen: Normal in size without focal abnormality. Adrenals/Urinary Tract: Normal adrenal glands. No renal stones or hydronephrosis. Both ureters are decompressed without stones along the course. Urinary bladder is nondistended. Stomach/Bowel: Fluid/ingested material in the stomach. Mild duodenal wall thickening in the fourth portion adjacent to the pancreatic inflammation. There is no  small bowel obstruction or inflammatory change. Detailed bowel assessment is limited in the absence of contrast and presence of abdominal ascites. The majority of the colon is decompressed. Vascular/Lymphatic: Normal caliber abdominal aorta. Few prominent periportal lymph nodes are likely reactive. Reproductive: Abnormal low-density in the lower uterine segment/cervix spanning 18 mm, nonspecific. No evidence of adnexal mass. Other: Small to moderate volume of abdominopelvic ascites. There is free fluid in both upper quadrants, both pericolic gutters, mesentery, and tracking into the pelvis. No free air or organized fluid collection. Small fat containing umbilical hernia. Musculoskeletal: Degenerative change in the spine. There are no acute or suspicious osseous abnormalities. IMPRESSION: 1. Acute edematous pancreatitis. No evidence of acute peripancreatic collection. 2. Hepatomegaly and hepatic steatosis. Layering hyperdensity in the gallbladder may represent sludge or layering stones. 3. Small to moderate volume of abdominopelvic ascites. This may be in part related to pancreatitis, underlying liver disease, or combination thereof. 4. Abnormal low-density in the lower uterine segment/cervix spanning 18 mm, nonspecific. Recommend nonemergent pelvic ultrasound for characterization on an elective outpatient basis. Electronically Signed   By: Narda Rutherford M.D.   On: 04/02/2020 20:06   DG Chest Port 1 View  Result Date: 04/02/2020 CLINICAL DATA:  Sepsis EXAM: PORTABLE CHEST 1 VIEW COMPARISON:  None. FINDINGS: Low lung volume with hypoventilatory change. Atelectasis at the right base. Borderline cardiomegaly augmented by low lung volume. No pneumothorax. IMPRESSION: Low lung volume with atelectasis at the right base. Electronically Signed   By: Jasmine Pang M.D.   On: 04/02/2020 20:08    EKG: Independently reviewed.  Sinus tachycardia.  Assessment/Plan Active Problems:   SIRS (systemic inflammatory  response syndrome) (HCC)   AKI (acute kidney injury) (HCC)   Acute pancreatitis   Hypothyroidism    1. Acute pancreatitis -cause not clear.  Given that there is some layering density in the gallbladder concerning for stones which could be the cause.  For which I have ordered MRCP.  Check triglyceride levels.  Patient states she does not drink alcohol.  Aggressive hydration and pain relief medications.  Patient has received 4 L bolus and will continue with Ringer lactate at 250 cc/h for now.  Follow intake output metabolic panel and GI consult. 2. SIRS picture secondary to acute pancreatitis.  Since patient has significant leukocytosis empiric antibiotics were started follow cultures lactic acid procalcitonin. 3. Hypothyroidism on Synthroid which will dose with IV for now follow TSH. 4. Acute renal failure no labs to compare.  Follow metabolic panel closely.  UA is pending. 5. Hyperglycemia suspecting patient may be having diabetes mellitus type 2.  Will check hemoglobin A1c.  If elevated will need sliding scale coverage. 6. Metabolic acidosis likely from dehydration and lactic acidosis.  Follow metabolic panel.  I do not think patient is in DKA. 7. Ascites could be pancreatitis.  Not sure if patient has cirrhosis.  MRCP pending. 8. Abnormal density in the uterus will need follow-up.  Pregnancy screen is negative.  Given SIRS picture with acute severe pancreatitis patient will need close monitoring and inpatient status.   DVT prophylaxis: SCDs for now in  anticipation of possible procedure avoiding anticoagulation.  Follow MRCP. Code Status: Full code. Family Communication: Discussed with patient. Disposition Plan: Home. Consults called: Gastroenterology. Admission status: Inpatient.   Rise Patience MD [facility] Pager [phone].  If 7PM-7AM, please contact night-coverage [email or web address] Password [facility]  04/03/2020, 5:39 AM

## 2020-04-03 NOTE — Progress Notes (Signed)
eLink Physician-Brief Progress Note Patient Name: Amber May DOB: Jan 05, 1981 MRN: [id number]   Date of Service  04/03/2020  HPI/Events of Note  Fever to 101.3 F - ALT is elevated, therefore, can't use Tylenol. Patient is NPO, therefore, can't use Motrin.   eICU Interventions  Plan: 1. Ice packs PRN.      Intervention Category Major Interventions: Other:  Lenell Antu 04/03/2020, 8:34 PM

## 2020-04-04 ENCOUNTER — Inpatient Hospital Stay (HOSPITAL_COMMUNITY): Payer: Self-pay

## 2020-04-04 LAB — CBC WITH DIFFERENTIAL/PLATELET
Abs Immature Granulocytes: 0.59 10*3/uL — ABNORMAL HIGH (ref 0.00–0.07)
Basophils Absolute: 0.1 10*3/uL (ref 0.0–0.1)
Basophils Relative: 0 %
Eosinophils Absolute: 0 10*3/uL (ref 0.0–0.5)
Eosinophils Relative: 0 %
HCT: 48.1 % — ABNORMAL HIGH (ref 36.0–46.0)
Hemoglobin: 15 g/dL (ref 12.0–15.0)
Immature Granulocytes: 2 %
Lymphocytes Relative: 5 %
Lymphs Abs: 1.9 10*3/uL (ref 0.7–4.0)
MCH: 25.8 pg — ABNORMAL LOW (ref 26.0–34.0)
MCHC: 31.2 g/dL (ref 30.0–36.0)
MCV: 82.8 fL (ref 80.0–100.0)
Monocytes Absolute: 2 10*3/uL — ABNORMAL HIGH (ref 0.1–1.0)
Monocytes Relative: 6 %
Neutro Abs: 30 10*3/uL — ABNORMAL HIGH (ref 1.7–7.7)
Neutrophils Relative %: 87 %
Platelets: 305 10*3/uL (ref 150–400)
RBC: 5.81 MIL/uL — ABNORMAL HIGH (ref 3.87–5.11)
RDW: 16.5 % — ABNORMAL HIGH (ref 11.5–15.5)
WBC: 34.6 10*3/uL — ABNORMAL HIGH (ref 4.0–10.5)
nRBC: 0 % (ref 0.0–0.2)

## 2020-04-04 LAB — CBC
HCT: 47.4 % — ABNORMAL HIGH (ref 36.0–46.0)
Hemoglobin: 14.7 g/dL (ref 12.0–15.0)
MCH: 25.8 pg — ABNORMAL LOW (ref 26.0–34.0)
MCHC: 31 g/dL (ref 30.0–36.0)
MCV: 83.2 fL (ref 80.0–100.0)
Platelets: 336 10*3/uL (ref 150–400)
RBC: 5.7 MIL/uL — ABNORMAL HIGH (ref 3.87–5.11)
RDW: 16.5 % — ABNORMAL HIGH (ref 11.5–15.5)
WBC: 33.3 10*3/uL — ABNORMAL HIGH (ref 4.0–10.5)
nRBC: 0 % (ref 0.0–0.2)

## 2020-04-04 LAB — GLUCOSE, CAPILLARY
Glucose-Capillary: 163 mg/dL — ABNORMAL HIGH (ref 70–99)
Glucose-Capillary: 153 mg/dL — ABNORMAL HIGH (ref 70–99)
Glucose-Capillary: 160 mg/dL — ABNORMAL HIGH (ref 70–99)
Glucose-Capillary: 146 mg/dL — ABNORMAL HIGH (ref 70–99)

## 2020-04-04 LAB — COMPREHENSIVE METABOLIC PANEL
ALT: 59 U/L — ABNORMAL HIGH (ref 0–44)
AST: 20 U/L (ref 15–41)
Albumin: 2.3 g/dL — ABNORMAL LOW (ref 3.5–5.0)
Alkaline Phosphatase: 89 U/L (ref 38–126)
Anion gap: 8 (ref 5–15)
BUN: 16 mg/dL (ref 6–20)
CO2: 22 mmol/L (ref 22–32)
Calcium: 6.7 mg/dL — ABNORMAL LOW (ref 8.9–10.3)
Chloride: 103 mmol/L (ref 98–111)
Creatinine, Ser: 1.01 mg/dL — ABNORMAL HIGH (ref 0.44–1.00)
GFR calc Af Amer: >60 mL/min (ref >60)
GFR calc non Af Amer: >60 mL/min (ref >60)
Glucose, Bld: 149 mg/dL — ABNORMAL HIGH (ref 70–99)
Potassium: 4 mmol/L (ref 3.5–5.1)
Sodium: 133 mmol/L — ABNORMAL LOW (ref 135–145)
Total Bilirubin: 2 mg/dL — ABNORMAL HIGH (ref 0.3–1.2)
Total Protein: 5.6 g/dL — ABNORMAL LOW (ref 6.5–8.1)

## 2020-04-04 LAB — LACTIC ACID, PLASMA: Lactic Acid, Venous: 1.9 mmol/L (ref 0.5–1.9)

## 2020-04-04 LAB — PROCALCITONIN: Procalcitonin: 1.16 ng/mL

## 2020-04-04 LAB — MRSA PCR SCREENING: MRSA by PCR: NEGATIVE

## 2020-04-04 LAB — LIPASE, BLOOD: Lipase: 44 U/L (ref 11–51)

## 2020-04-04 LAB — MAGNESIUM: Magnesium: 2 mg/dL (ref 1.7–2.4)

## 2020-04-04 LAB — PHOSPHORUS: Phosphorus: 2.1 mg/dL — ABNORMAL LOW (ref 2.5–4.6)

## 2020-04-04 MED ORDER — ENOXAPARIN SODIUM 40 MG/0.4ML ~~LOC~~ SOLN
40.0000 mg | SUBCUTANEOUS | Status: DC
  Filled 2020-04-04: qty 0.4

## 2020-04-04 MED ORDER — ENOXAPARIN SODIUM 40 MG/0.4ML ~~LOC~~ SOLN
40.0000 mg | SUBCUTANEOUS | Status: DC
  Administered 2020-04-04 – 2020-04-05 (×2): 40 mg via SUBCUTANEOUS
  Filled 2020-04-04: qty 0.4

## 2020-04-04 MED ORDER — POTASSIUM PHOSPHATES 15 MMOLE/5ML IV SOLN
20.0000 mmol | Freq: Once | INTRAVENOUS | Status: AC
  Administered 2020-04-04: 20 mmol via INTRAVENOUS
  Filled 2020-04-04: qty 6.67

## 2020-04-04 MED ORDER — SODIUM PHOSPHATES 45 MMOLE/15ML IV SOLN
10.0000 mmol | Freq: Once | INTRAVENOUS | Status: AC
  Administered 2020-04-04: 10 mmol via INTRAVENOUS
  Filled 2020-04-04: qty 3.33

## 2020-04-04 MED ORDER — VANCOMYCIN HCL 1500 MG/300ML IV SOLN
1500.0000 mg | Freq: Two times a day (BID) | INTRAVENOUS | Status: DC
  Administered 2020-04-04 – 2020-04-06 (×4): 1500 mg via INTRAVENOUS
  Filled 2020-04-04 (×5): qty 300

## 2020-04-04 MED ORDER — LACTATED RINGERS IV SOLN: INTRAVENOUS | Status: DC

## 2020-04-04 MED ORDER — CALCIUM GLUCONATE-NACL 1-0.675 GM/50ML-% IV SOLN
1.0000 g | Freq: Once | INTRAVENOUS | Status: AC
  Administered 2020-04-04: 1000 mg via INTRAVENOUS
  Filled 2020-04-04: qty 50

## 2020-04-04 NOTE — Progress Notes (Signed)
PROGRESS NOTE  Amber May TZG:[id number] DOB: [birth year] DOA: 04/02/2020 PCP: Patient, No Pcp Per  Brief History   39 year old woman PMH hypothyroidism, morbid obesity presented with abdominal pain and fever.  Admitted for severe acute pancreatitis, SIRS.  A & P  Acute pancreatitis with SIRS uncomplicated by CT and MRCP but with severe clinical features including fever, acute hypoxic respiratory failure, tachypnea, lactic acidosis, leukocytosis, acute kidney injury, ascites, elevated LFTs.  Presumed gallstone pancreatitis.  Triglycerides without significant elevation.  No alcohol use.  Elevated LFTs on admission suggest the possibility of gallstone pancreatitis. --Low-grade temp again last night.  No definite infection on imaging.  Continue empiric antibiotics given severity of illness.  Lactic acid has normalized.  Follow-up culture data, blood cultures no growth thus far. --Some concern for cholecystitis.   --Total bilirubin ALT trending down. --Appreciate gastroenterology and general surgery management --Continue aggressive fluids, antibiotics, strict I/O.  Continue to closely monitor respiratory status.  Tachypneic in the 40s without change compared to yesterday.  No indication for intubation.  Wean oxygen as tolerated. --Remains critically ill.  Remain in ICU.  Possible cholecystitis. --Continue empiric antibiotics.  Will need laparoscopic cholecystectomy during admission.  Appreciate surgical evaluation.  Modest hypocalcemia, hypophosphatemia --Replete.  Acute kidney injury secondary to acute pancreatitis --Resolved with aggressive IV fluids.  Urine output fair.  Agree with increasing IV fluid rate.   --Daily BMP   Hyperglycemia secondary to acute pancreatitis.  Hemoglobin A1c 5.4. --Appears stable.  Continue sliding scale insulin.  Hepatomegaly and hepatic steatosis.  Likely related to morbid obesity. --Follow-up as an outpatient with gastroenterology.  Abnormal  low-density in the lower uterine segment/cervix spanning 18 mm, nonspecific. Recommend nonemergent pelvic ultrasound for characterization on an elective outpatient basis.  Morbid obesity. Body mass index is 58.87 kg/m. --Dietitian consult once stable.  Disposition Plan:  Discussion:   Status is: Inpatient  Remains inpatient appropriate because:IV treatments appropriate due to intensity of illness or inability to take PO   Dispo: The patient is from: Home              Anticipated d/c is to: Home              Anticipated d/c date is: > 3 days              Patient currently is not medically stable to d/c.   DVT prophylaxis:  SCDs Start: 04/03/20 0534   Code Status: Full Family Communication: mother at bedside  Murray Hodgkins, MD  [facility] Direct contact: see [email or web address] (further directions at bottom of note if needed) 7PM-7AM contact night coverage as at bottom of note 04/04/2020, 9:59 AM  LOS: 1 day   Significant Hospital Events   . 6/25 admitted for severe pancreatitis, fever   Consults:  . Gastroenterology . Critical care medicine . General surgery   Procedures:  .   Significant Diagnostic Tests:   6/25 CT abdomen pelvis acute edematous pancreatitis.  No acute peripancreatic collection.  Layering hyperdensity in the gallbladder may be represent sludge or stones.  Small to moderate volume ascites.  Hepatomegaly.  Hepatic steatosis.  6/25 MRCP Limited study.  Cholelithiasis, apparent gallbladder wall thickening.  No definite choledocholithiasis.  Mild edema pancreatic head.  6/25 right upper quadrant ultrasound.  Cholecystolithiasis, thickened gallbladder.  Consider HIDA.   Micro Data:  . 6/24 blood cultures . 6/24 urine culture   Antimicrobials:  . Cefepime > . Vancomycin >  Interval History/Subjective  Discussed with RN.  Quiet night.  Discussed with Dr. Toniann Fail night coverage.  With fever, concern for cholecystitis, he discussed with general  surgery, will see today. Patient reports feeling a little better.  Less abdominal pain.  Breathing okay.  Objective   Vitals:  Vitals:   04/04/20 0700 04/04/20 0702  BP: (!) 134/98   Pulse:  (!) 118  Resp:  (!) 28  Temp:  (!) 100.4 F (38 C)  SpO2:  92%    Exam:  Constitutional.  Appears calm, comfortable.  Ill but better than yesterday.  Nontoxic. Respiratory.  Clear to auscultation bilaterally.  No wheezes, rales or rhonchi.  Normal respiratory effort. Cardiovascular.  Tachycardic, regular rhythm.  No murmur, rub or gallop.  1+ bilateral lower extremity edema. Abdomen.  Obese.  Soft, nontender.  Nondistended. Skin.  No rash or lesions noted.  Perfusion appears grossly normal. Psychiatric.  Grossly normal mood and affect.  Speech fluent and appropriate.  I have personally reviewed the following:   Today's Data  . CBG stable  BMP unremarkable except for mild hypocalcemia 7.7.  Phosphorus slightly low at 2.1.  Magnesium within normal limits.  Lipase within normal limits. ALT and total bilirubin trending down.  Lactic acid within normal limits this morning. . WBC down to 34.6.  Scheduled Meds: . Chlorhexidine Gluconate Cloth  6 each Topical Daily  . insulin aspart  0-6 Units Subcutaneous Q6H  . levothyroxine  75 mcg Intravenous Daily  . mouth rinse  15 mL Mouth Rinse BID   Continuous Infusions: . sodium chloride Stopped (04/03/20 1159)  . ceFEPime (MAXIPIME) IV Stopped (04/04/20 0641)  . lactated ringers    . potassium PHOSPHATE IVPB (in mmol) 20 mmol (04/04/20 0937)  . sodium phosphate  Dextrose 5% IVPB    . vancomycin Stopped (04/04/20 3546)    Principal Problem:   Acute pancreatitis Active Problems:   SIRS (systemic inflammatory response syndrome) (HCC)   AKI (acute kidney injury) (HCC)   Hypothyroidism   Hyperglycemia   Elevated liver enzymes   Acute respiratory failure with hypoxemia (HCC)   LOS: 1 day   How to contact the [facility] Attending or Consulting  provider 7A - 7P or covering provider during after hours 7P -7A, for this patient?  1. Check the care team in [facility] and look for a) attending/consulting [facility] provider listed and b) the [facility] team listed 2. Log into [email or web address] and use [place]'s universal password to access. If you do not have the password, please contact the hospital operator. 3. Locate the [facility] provider you are looking for under [facility] and page to a number that you can be directly reached. 4. If you still have difficulty reaching the provider, please page the [facility] (Director on Call) for the Hospitalists listed on amion for assistance.

## 2020-04-04 NOTE — Consult Note (Signed)
NAME:  Amber May, MRN:  [id number], DOB:  12/31/1980, LOS: 1 ADMISSION DATE:  04/02/2020, CONSULTATION DATE:  6/25 REFERRING MD:  Irene Limbo, CHIEF COMPLAINT:   Acute hypoxic respiratory failure in setting of pancreatitis  Brief History    Amber May is a 39 y.o. female with a history of thyroid disease and obesity, no other significant history, admitted 6/24 with pancreatitis. Patient has had abdominal pain along with nausea / vomiting since Tuesday  6/22 ( x 3 days) . Symptoms have progressed, including  diaphoresis and fever since 6/23 , she presented to the hospital for care. She denies ever having pancreatitis or problems with her pancreas before. Her grandmother passed away from pancreatitis. She does not drink any alcohol. No prior surgical history.   ED presentation included WBC of 45, lactic acidosis. COVID negative. Lipase only mildly elevated to 144, AST 74, ALT 168, AP 130, T bil 4.0. Cr 1.98 on admission. CT scan done showing acute pancreatitis, hepatomegaly with steatosis, sludge / stones suspected in the gallbladder. No obvious biliary ductal dilation. She was given 4 L fluid bolus and then on 250cc/hr of LR. Given IV cefepime.   She remains tachycardic to low 100s, BP is soft, and she has   tachypnea , RR 40-50  , but she is maintaining sats of 95-95% at present. Mental status is clear. LFT's are down trending ( bil to 2.4, AP 96, ALT 101, AST 33),Cr improved 1.22, Ca at 6.4.  WBC at 39 today, Hgb went from 17.8 to 16.0 overnight. Lactic acidosis improving.  Troponin is positive to 54. Trigs 176.   GI has been consulted and patient is scheduled for an MRCP within the hour to evaluate her biliary tree  PCCM have been consulted as a precaution in the event patient has progressive decompensation of respiratory status, requiring intubation.   Past Medical History    Past Medical History:  Diagnosis Date  . Obesity   . Thyroid disease     Significant Hospital Events     03/31/2020>> Symptoms started 04/02/2020>> Admission to hospital  Consults:  6/25 PCCM 6/25 GI  Procedures:    Significant Diagnostic Tests:  04/03/2020>> MRCP to rule out choledocholithiasis / cholangitis  6/24 CXR>>  Low lung volume with hypoventilatory change. Atelectasis at the right base. Borderline cardiomegaly augmented by low lung volume. No Pneumothorax.  6/25 [place] - here is cholecystolithiasis. Additionally, there is a thickened appearance of the gallbladder wall measuring 5 mm. Acute cholecystitis cannot be excluded, although gallbladder wall thickening is a nonspecific finding in the setting of abdominal ascites,  6/25 MR Abdomen Limited study due to motion artifact and lack of intravenous contrast. Patient was unable to complete the entire exam. 2. Cholelithiasis with apparent gallbladder wall thickening. No intrahepatic or extrahepatic biliary duct dilatation. No definite choledocholithiasis. If there is clinical concern for acute cholecystitis, abdominal ultrasound or nuclear scintigraphy may prove helpful to further evaluate. 3. Mild edema in the region of the pancreatic head. No main duct dilatation. Imaging features may be related to pancreatitis. 4. Hepatomegaly with steatosis.  5. Moderate volume intraperitoneal free fluid. 6. Bilateral pleural effusions associated with bibasilar collapse/consolidative changes.   Micro Data:  04/02/2020 Blood Cultures>> 04/02/2020 Urine>> 04/02/2020  SARS Coronavirus 2 NEGATIVE NEGATIVE       Antimicrobials:  Cefepime 04/02/2020>> Vancomycin 04/03/2020>>  Flagyl 04/02/2020 x 1 dose in ED  Interim history/subjective:    04/04/2020 -  on5-6L [place], RR 30s but not paradoxical. Reports abd  pain but well controlled with pain meds . Watching TV.  Not on pressors.  FEbrile overnight and given ice packs (challenges with tylenol and motrin). GI following.  Plan is for ERCP and cholecystectomy at some point. CCS consult  pending   Objective   Blood pressure (!) 134/98, pulse (!) 118, temperature (!) 100.4 F (38 C), resp. rate (!) 28, height 5\' 2"  (1.575 m), weight (!) 146 kg, last menstrual period 03/09/2020, SpO2 92 %.        Intake/Output Summary (Last 24 hours) at 04/04/2020 0848 Last data filed at 04/04/2020 0700 Gross per 24 hour  Intake 4597.67 ml  Output 905 ml  Net 3692.67 ml   Filed Weights   04/02/20 1813 04/03/20 0100 04/04/20 0500  Weight: (!) 147.4 kg (!) 140.9 kg (!) 146 kg    General Appearance:  Obese watching TV Head:  Normocephalic, without obvious abnormality, atraumatic Eyes:  PERRL - yes, conjunctiva/corneas - muddy     Ears:  Normal external ear canals, both ears Nose:  G tube - no but has [place] at 5-6L  Throat:  ETT TUBE - no , OG tube - no Neck:  Supple,  No enlargement/tenderness/nodules Lungs: Clear to auscultation bilaterally, Distant. Tachypneic but not paradoxical. Speaks full sentences. No acc muscle use Heart:  S1 and S2 normal, no murmur, CVP - no.  Pressors - n Abdomen:  Soft, no masses, no organomegaly. Obese, Mild non specific tenderness Genitalia / Rectal:  Not done Extremities:  Extremities- intact Skin:  ntact in exposed areas . Sacral area - not examined Neurologic:  Sedation - none -> RASS - +1 . Moves all 4s - yes. CAM-ICU - neg . Orientation - x3+      Resolved Hospital Problem list   Lactic acidosis 6/25 AKI 6/26  Assessment & Plan:  Acute Hypoxic Respiratory failure in setting of pancreatitis  CXR with atelectasis Right base Bilateral pleural effusions associated with bibasilar collapse/consolidative changes per abdominal MRCP.  04/04/2020 - persistent acute hypoxemic resp failure due to above reasons. Not intubation threshold  But remains at risk  Plan Titrate oxygen to maintain oxygen saturations > 92% Incentive spiro _+Aggressive Pulmonary Toilet OOB when stable enough to do so ABG prn   Acute Pancreatitis with SIRS, appears  uncomplicated per CT, but severe clinical features ( Fever, Tachypnea, Lactic Acidosis, Leukocytosis, AKI, Elevated LFT's) Triglycerides slightly elevated Transaminases and total bilirubin elevated on admission/  imaging suggested possible stones in GB  Suspect gallstone pancreatitis Alkaline phosphatase, AST have normalized.  ALT and total bilirubin trending down.  + Cholelithiasis per MRCP  04/04/2020  - stil lNPO and some abdominal tenderness. Bili better  Plan Per GI Per [facility] ? CCS consult pending     Acute Kidney Injury  6/26 - resolved  Plan Trend BMET Replete electrolytes as needed Maintain renal perfusion  Ascites 2/2 acute pancreatitis Plan Follow for resolution/ worsening Per GI  Hypophos   Plan  - replete  - goal  K > 4  - goal mag > 2  Best practice:  Diet: NPO Pain/Anxiety/Delirium protocol (if indicated): NA VAP protocol (if indicated): NA DVT prophylaxis: PAS hose GI prophylaxis: Per GI Glucose control: CBG and SSI Mobility: BR Code Status: Full Family Communication: Pt.updated at bedside 04/04/2020 Disposition: Step Down     ATTESTATION & SIGNATURE   The patient Amber May is critically ill with multiple organ systems failure and requires high complexity decision making for assessment and support, frequent evaluation and  titration of therapies, application of advanced monitoring technologies and extensive interpretation of multiple databases.   Critical Care Time devoted to patient care services described in this note is  31  Minutes. This time reflects time of care of this signee Dr Kalman Shan. This critical care time does not reflect procedure time, or teaching time or supervisory time of PA/NP/Med student/Med Resident etc but could involve care discussion time     Dr. Kalman Shan, M.D., [facility].C.P Pulmonary and Critical Care Medicine Staff Physician [facility] Pager: [phone], If no answer or between  15:00h - 7:00h: call [phone]  04/04/2020 8:49 AM  LABS    PULMONARY No results for input(s): PHART, PCO2ART, PO2ART, HCO3, TCO2, O2SAT in the last 168 hours.  Invalid input(s): PCO2, PO2  CBC Recent Labs  Lab 04/03/20 0709 04/03/20 2231 04/04/20 0318  HGB 16.0* 15.5* 15.0  HCT 50.5* 48.7* 48.1*  WBC 39.3* 38.3* 34.6*  PLT 333 289 305    COAGULATION No results for input(s): INR in the last 168 hours.  CARDIAC  No results for input(s): TROPONINI in the last 168 hours. No results for input(s): PROBNP in the last 168 hours.   CHEMISTRY Recent Labs  Lab 04/02/20 1847 04/02/20 1847 04/03/20 0709 04/04/20 0318  NA 136  --  135 133*  K 3.6   < > 4.1 4.0  CL 100  --  106 103  CO2 17*  --  18* 22  GLUCOSE 228*  --  172* 149*  BUN 20  --  21* 16  CREATININE 1.98*  --  1.22* 1.01*  CALCIUM 7.8*  --  6.4* 6.7*  MG  --   --  1.5* 2.0  PHOS  --   --   --  2.1*   < > = values in this interval not displayed.   Estimated Creatinine Clearance: 102.4 mL/min (A) (by C-G formula based on SCr of 1.01 mg/dL (H)).   LIVER Recent Labs  Lab 04/02/20 1847 04/03/20 0709 04/04/20 0318  AST 74* 33 20  ALT 168* 101* 59*  ALKPHOS 130* 96 89  BILITOT 4.0* 2.4* 2.0*  PROT 7.7 5.8* 5.6*  ALBUMIN 3.8 2.8* 2.3*     INFECTIOUS Recent Labs  Lab 04/03/20 0709 04/03/20 0938 04/03/20 1337 04/03/20 1752 04/04/20 0318  LATICACIDVEN 2.2*   < > 2.2* 1.9 1.9  PROCALCITON 1.45  --   --   --  1.16   < > = values in this interval not displayed.     ENDOCRINE CBG (last 3)  Recent Labs    04/03/20 2153 04/04/20 0547 04/04/20 0729  GLUCAP 141* 163* 153*         IMAGING x48h  - image(s) personally visualized  -   highlighted in bold CT ABDOMEN PELVIS WO CONTRAST  Result Date: 04/02/2020 CLINICAL DATA:  Abdominal distension. EXAM: CT ABDOMEN AND PELVIS WITHOUT CONTRAST TECHNIQUE: Multidetector CT imaging of the abdomen and pelvis was  performed following the standard protocol without IV contrast. COMPARISON:  None. FINDINGS: Lower chest: Motion artifact through the lung bases. Elevation of right hemidiaphragm with compressive atelectasis. Possible trace right pleural effusion. Hepatobiliary: Enlarged liver spanning 20 cm cranial caudal with mild steatosis. No evidence of discrete focal lesion. Motion limits assessment for capsular nodularity. Layering hyperdensity in the gallbladder. Ascites adjacent to the gallbladder which is similar to the ascites adjacent to the liver. Common bile duct is not  well-defined, there is no obvious biliary dilatation. Pancreas: Peripancreatic fat stranding and edema adjacent to the head and uncinate process. Small amount of non organized peripancreatic fluid. No ductal dilatation. No evidence of acute peripancreatic collection. Cannot assess for necrosis in the absence of IV contrast. Spleen: Normal in size without focal abnormality. Adrenals/Urinary Tract: Normal adrenal glands. No renal stones or hydronephrosis. Both ureters are decompressed without stones along the course. Urinary bladder is nondistended. Stomach/Bowel: Fluid/ingested material in the stomach. Mild duodenal wall thickening in the fourth portion adjacent to the pancreatic inflammation. There is no small bowel obstruction or inflammatory change. Detailed bowel assessment is limited in the absence of contrast and presence of abdominal ascites. The majority of the colon is decompressed. Vascular/Lymphatic: Normal caliber abdominal aorta. Few prominent periportal lymph nodes are likely reactive. Reproductive: Abnormal low-density in the lower uterine segment/cervix spanning 18 mm, nonspecific. No evidence of adnexal mass. Other: Small to moderate volume of abdominopelvic ascites. There is free fluid in both upper quadrants, both pericolic gutters, mesentery, and tracking into the pelvis. No free air or organized fluid collection. Small fat containing  umbilical hernia. Musculoskeletal: Degenerative change in the spine. There are no acute or suspicious osseous abnormalities. IMPRESSION: 1. Acute edematous pancreatitis. No evidence of acute peripancreatic collection. 2. Hepatomegaly and hepatic steatosis. Layering hyperdensity in the gallbladder may represent sludge or layering stones. 3. Small to moderate volume of abdominopelvic ascites. This may be in part related to pancreatitis, underlying liver disease, or combination thereof. 4. Abnormal low-density in the lower uterine segment/cervix spanning 18 mm, nonspecific. Recommend nonemergent pelvic ultrasound for characterization on an elective outpatient basis. Electronically Signed   By: Narda Rutherford M.D.   On: 04/02/2020 20:06   MR ABDOMEN MRCP WO CONTRAST  Result Date: 04/03/2020 CLINICAL DATA:  Cholelithiasis. Known pancreatitis. Patient had difficulty breathing and was unable to complete the last series of the exam. EXAM: MRI ABDOMEN WITHOUT CONTRAST  (INCLUDING MRCP) TECHNIQUE: Multiplanar multisequence MR imaging of the abdomen was performed. Heavily T2-weighted images of the biliary and pancreatic ducts were obtained, and three-dimensional MRCP images were rendered by post processing. COMPARISON:  CT scan 04/02/2020 FINDINGS: Lower chest: Bilateral pleural effusions associated with bibasilar collapse/consolidative changes. Hepatobiliary: Liver measures 20.8 cm craniocaudal length, enlarged. Loss of signal in the liver parenchyma on out of phase T1 imaging suggests fatty deposition. No focal abnormality in the liver parenchyma on this noncontrast motion degraded study. Numerous tiny gallstones evident with apparent gallbladder wall thickening. No intrahepatic biliary duct dilatation. No extrahepatic biliary duct dilatation. Study is markedly limited, but no definite choledocholithiasis. Axial T2 haste imaging demonstrates some central flow artifact in the common duct, but no definite ductal stones.  Pancreas: No main duct dilatation. No gross mass lesion in the pancreas. Mild edema seen in the region of the pancreatic head. Spleen:  No splenomegaly. No focal mass lesion. Adrenals/Urinary Tract: No adrenal nodule or mass. No hydronephrosis. No gross renal mass on this noncontrast study. Probable 11 mm cyst interpolar left kidney. Stomach/Bowel: Stomach is unremarkable. No gastric wall thickening. No evidence of outlet obstruction. Duodenum is normally positioned as is the ligament of Treitz. No small bowel or colonic dilatation within the visualized abdomen. Vascular/Lymphatic: No abdominal aortic aneurysm. There is no gastrohepatic or hepatoduodenal ligament lymphadenopathy. No retroperitoneal or mesenteric lymphadenopathy. Other:  Moderate volume intraperitoneal free fluid. Musculoskeletal: No suspicious marrow signal abnormality within the visualized bony anatomy. IMPRESSION: 1. Limited study due to motion artifact and lack of intravenous contrast. Patient  was unable to complete the entire exam. 2. Cholelithiasis with apparent gallbladder wall thickening. No intrahepatic or extrahepatic biliary duct dilatation. No definite choledocholithiasis. If there is clinical concern for acute cholecystitis, abdominal ultrasound or nuclear scintigraphy may prove helpful to further evaluate. 3. Mild edema in the region of the pancreatic head. No main duct dilatation. Imaging features may be related to pancreatitis. 4. Hepatomegaly with steatosis. 5. Moderate volume intraperitoneal free fluid. 6. Bilateral pleural effusions associated with bibasilar collapse/consolidative changes. Electronically Signed   By: Misty Stanley M.D.   On: 04/03/2020 12:02   MR 3D Recon At Scanner  Result Date: 04/03/2020 CLINICAL DATA:  Cholelithiasis. Known pancreatitis. Patient had difficulty breathing and was unable to complete the last series of the exam. EXAM: MRI ABDOMEN WITHOUT CONTRAST  (INCLUDING MRCP) TECHNIQUE: Multiplanar  multisequence MR imaging of the abdomen was performed. Heavily T2-weighted images of the biliary and pancreatic ducts were obtained, and three-dimensional MRCP images were rendered by post processing. COMPARISON:  CT scan 04/02/2020 FINDINGS: Lower chest: Bilateral pleural effusions associated with bibasilar collapse/consolidative changes. Hepatobiliary: Liver measures 20.8 cm craniocaudal length, enlarged. Loss of signal in the liver parenchyma on out of phase T1 imaging suggests fatty deposition. No focal abnormality in the liver parenchyma on this noncontrast motion degraded study. Numerous tiny gallstones evident with apparent gallbladder wall thickening. No intrahepatic biliary duct dilatation. No extrahepatic biliary duct dilatation. Study is markedly limited, but no definite choledocholithiasis. Axial T2 haste imaging demonstrates some central flow artifact in the common duct, but no definite ductal stones. Pancreas: No main duct dilatation. No gross mass lesion in the pancreas. Mild edema seen in the region of the pancreatic head. Spleen:  No splenomegaly. No focal mass lesion. Adrenals/Urinary Tract: No adrenal nodule or mass. No hydronephrosis. No gross renal mass on this noncontrast study. Probable 11 mm cyst interpolar left kidney. Stomach/Bowel: Stomach is unremarkable. No gastric wall thickening. No evidence of outlet obstruction. Duodenum is normally positioned as is the ligament of Treitz. No small bowel or colonic dilatation within the visualized abdomen. Vascular/Lymphatic: No abdominal aortic aneurysm. There is no gastrohepatic or hepatoduodenal ligament lymphadenopathy. No retroperitoneal or mesenteric lymphadenopathy. Other:  Moderate volume intraperitoneal free fluid. Musculoskeletal: No suspicious marrow signal abnormality within the visualized bony anatomy. IMPRESSION: 1. Limited study due to motion artifact and lack of intravenous contrast. Patient was unable to complete the entire exam. 2.  Cholelithiasis with apparent gallbladder wall thickening. No intrahepatic or extrahepatic biliary duct dilatation. No definite choledocholithiasis. If there is clinical concern for acute cholecystitis, abdominal ultrasound or nuclear scintigraphy may prove helpful to further evaluate. 3. Mild edema in the region of the pancreatic head. No main duct dilatation. Imaging features may be related to pancreatitis. 4. Hepatomegaly with steatosis. 5. Moderate volume intraperitoneal free fluid. 6. Bilateral pleural effusions associated with bibasilar collapse/consolidative changes. Electronically Signed   By: Misty Stanley M.D.   On: 04/03/2020 12:02   DG CHEST PORT 1 VIEW  Result Date: 04/04/2020 CLINICAL DATA:  Acute respiratory failure EXAM: PORTABLE CHEST 1 VIEW COMPARISON:  April 03, 2020 FINDINGS: Bilateral pleural effusions with underlying opacities. No other changes. IMPRESSION: Stable bilateral effusions with underlying opacities, favored represent atelectasis. No other change. Recommend follow-up to resolution. Electronically Signed   By: Dorise Bullion III M.D   On: 04/04/2020 08:37   DG CHEST PORT 1 VIEW  Result Date: 04/03/2020 CLINICAL DATA:  Hypoxia, shortness of breath EXAM: PORTABLE CHEST 1 VIEW COMPARISON:  04/02/2020 FINDINGS: Examination is significantly  limited secondary to poor penetration from patient body habitus, semiupright portable technique, and poor inspiratory effort. Very low lung volumes with accentuation of the heart size. Hazy and streaky bibasilar opacities suggesting small bilateral pleural effusions with bibasilar atelectasis. No pneumothorax. IMPRESSION: Limited examination. Hazy and streaky bibasilar opacities suggesting small bilateral pleural effusions with bibasilar atelectasis. Electronically Signed   By: Duanne Guess D.O.   On: 04/03/2020 13:59   DG Chest Port 1 View  Result Date: 04/02/2020 CLINICAL DATA:  Sepsis EXAM: PORTABLE CHEST 1 VIEW COMPARISON:  None.  FINDINGS: Low lung volume with hypoventilatory change. Atelectasis at the right base. Borderline cardiomegaly augmented by low lung volume. No pneumothorax. IMPRESSION: Low lung volume with atelectasis at the right base. Electronically Signed   By: Jasmine Pang M.D.   On: 04/02/2020 20:08   US Abdomen Limited RUQ  Result Date: 04/03/2020 CLINICAL DATA:  Pancreatitis. EXAM: ULTRASOUND ABDOMEN LIMITED RIGHT UPPER QUADRANT COMPARISON:  MRCP 04/03/2020 FINDINGS: The examination is limited due to patient body habitus. Gallbladder: Cholecystolithiasis. Thickened appearance of the gallbladder wall measuring 5 mm. No sonographic Eulah Pont sign is elicited by the scanning technologist. Common bile duct: Diameter: The common duct is dilated measuring 7 mm in diameter. Liver: Generalized increased hepatic parenchymal echogenicity. No definite focal liver lesion is identified. Portal vein is patent on color Doppler imaging with normal direction of blood flow towards the liver. Other: Moderate ascites within the visualized abdomen. IMPRESSION: Limited examination as described. There is cholecystolithiasis. Additionally, there is a thickened appearance of the gallbladder wall measuring 5 mm. Acute cholecystitis cannot be excluded, although gallbladder wall thickening is a nonspecific finding in the setting of abdominal ascites, and a nuclear medicine HIDA scan may be helpful for further assessment. Mild extrahepatic biliary ductal dilatation with the common duct measuring 7 mm in diameter. Hyperechogenicity of the hepatic parenchyma. Findings are consistent with the hepatic steatosis demonstrated on same day MRCP. Moderate ascites within the visualized abdomen. Electronically Signed   By: Jackey Loge DO   On: 04/03/2020 17:05

## 2020-04-04 NOTE — Consult Note (Signed)
Reason for Consult: pancreatitis and gallstones.   Referring Physician: [name] is an 39 y.o. female.  HPI:  Pt is a 39 yo F admitted to the hospital with pancreatitis.  She came through the [facility] ED.  Her main complaints are nausea and severe abdominal pain.  The pain has been going on for around 5 days. It became more severe on Thursday and after it didn't get better, she went to the ED.  She has never had pain like this before.  OTC analgesics didn't make it better.  She hasn't had EtOH recently.  Her LFTs were elevated including lipase.  T bili was 4.  She has had CT abd, MCP, and [place].  She had acute kidney injury on admission as well.  Her WBCs upon admit were in the 40k level.  She was admitted as a code sepsis to [facility] stepdown.    This morning her pain is improved with the pain medication.  She has some nausea today.  Her gallbladder wall was sl thickened on imaging.  She does have stones seen.  She does not have ductal dilation.    Past Medical History:  Diagnosis Date  . Obesity   . Thyroid disease     History reviewed. No pertinent surgical history.  Family History  Problem Relation Age of Onset  . Diabetes Mellitus II Maternal Grandmother     Social History:  reports that she has never smoked. She has never used smokeless tobacco. She reports previous alcohol use. She reports previous drug use.  Allergies: No Known Allergies  Medications:  ceFEPIme (MAXIPIME) 2 g in sodium chloride 0.9 % 100 mL IVPB 2 g, IV, Q8H Stopped, 06/26 0641  Chlorhexidine Gluconate Cloth 2 % PADS 6 each 6 each, TP, Daily Given, 6 each at 06/25 0939  insulin aspart (novoLOG) injection 0-6 Units 0-6 Units, [place], Q6H Given, 1 Units at 06/26 0609  levothyroxine (SYNTHROID, LEVOTHROID) injection 75 mcg 75 mcg, IV, Daily Given, 75 mcg at 06/25 1610  MEDLINE mouth rinse 15 mL, Mouth Rinse, BID Given, 15 mL at 06/25 2123  potassium PHOSPHATE 20 mmol in dextrose 5 % 500 mL infusion 20 mmol,  IV, Once Ordered  sodium phosphate 10 mmol in dextrose 5 % 250 mL infusion 10 mmol, IV, Once Ordered  vancomycin (VANCOREADY) IVPB 1250 mg/250 mL 1,250 mg, IV, Q12H Stopped, 06/26 0212  Continuous  Medication Ordered Dose/Rate, Route, Frequency Last Action  lactated ringers infusion 125 mL/hr, IV, Continuous Ordered  PRN  Medication Ordered Dose/Rate, Route, Frequency Last Action  0.9 % sodium chloride infusion 0-10 mL/hr, IV, PRN Stopped, 06/25 1159  HYDROmorphone (DILAUDID) injection 0.5 mg 0.5 mg, IV, Q2H PRN Given, 0.5 mg at 06/26 0450  ondansetron (ZOFRAN) injection 4 mg (Or Linked Group #1) 4 mg, IV, Q6H PRN Given, 4 mg at 06/25 1149  ondansetron (ZOFRAN) tablet 4 mg (Or Linked Group #1) 4 mg, PO, Q6H PRN See Alternative, 06/25 1149    Handoff  Write Handoff     Results for orders placed or performed during the hospital encounter of 04/02/20 (from the past 48 hour(s))  Blood Culture (routine x 2)     Status: None (Preliminary result)   Collection Time: 04/02/20  6:45 PM   Specimen: BLOOD  Result Value Ref Range   Specimen Description      BLOOD LEFT ANTECUBITAL Performed at [facility], [address] [place], [place] [address]    Special Requests  BOTTLES DRAWN AEROBIC AND ANAEROBIC Blood Culture results May not be optimal due to an excessive volume of blood received in culture bottles Performed at [facility], [address] [place], [place] [address]    Culture      NO GROWTH 2 DAYS Performed at [facility], [address] [place], [place] [address]    Report Status PENDING   Lactic acid, plasma     Status: Abnormal   Collection Time: 04/02/20  6:47 PM  Result Value Ref Range   Lactic Acid, Venous 3.9 (HH) 0.5 - 1.9 mmol/L    Comment: CRITICAL RESULT CALLED TO, READ BACK BY AND VERIFIED WITH: COBLE SAM RN AT 1922  ON 04/02/20 BY I.SUGUT Performed at [facility], [address] [place], [place] [address]    Comprehensive metabolic panel     Status: Abnormal   Collection Time: 04/02/20  6:47 PM  Result Value Ref Range   Sodium 136 135 - 145 mmol/L   Potassium 3.6 3.5 - 5.1 mmol/L   Chloride 100 98 - 111 mmol/L   CO2 17 (L) 22 - 32 mmol/L   Glucose, Bld 228 (H) 70 - 99 mg/dL    Comment: Glucose reference range applies only to samples taken after fasting for at least 8 hours.   BUN 20 6 - 20 mg/dL   Creatinine, Ser 0.96 (H) 0.44 - 1.00 mg/dL   Calcium 7.8 (L) 8.9 - 10.3 mg/dL   Total Protein 7.7 6.5 - 8.1 g/dL   Albumin 3.8 3.5 - 5.0 g/dL   AST 74 (H) 15 - 41 U/L   ALT 168 (H) 0 - 44 U/L   Alkaline Phosphatase 130 (H) 38 - 126 U/L   Total Bilirubin 4.0 (H) 0.3 - 1.2 mg/dL   GFR calc non Af Amer 31 (L) >60 mL/min   GFR calc Af Amer 35 (L) >60 mL/min   Anion gap 19 (H) 5 - 15    Comment: Performed at [facility], [address] [place], [place] [address]  CBC WITH DIFFERENTIAL     Status: Abnormal   Collection Time: 04/02/20  6:47 PM  Result Value Ref Range   WBC 45.6 (H) 4.0 - 10.5 K/uL   RBC 6.88 (H) 3.87 - 5.11 MIL/uL   Hemoglobin 17.8 (H) 12.0 - 15.0 g/dL   HCT 29.4 (H) 36 - 46 %   MCV 82.3 80.0 - 100.0 fL   MCH 25.9 (L) 26.0 - 34.0 pg   MCHC 31.4 30.0 - 36.0 g/dL   RDW 76.5 (H) 46.5 - 03.5 %   Platelets 534 (H) 150 - 400 K/uL   nRBC 0.0 0.0 - 0.2 %   Neutrophils Relative % 83 %   Neutro Abs 38.2 (H) 1.7 - 7.7 K/uL   Lymphocytes Relative 6 %   Lymphs Abs 2.6 0.7 - 4.0 K/uL   Monocytes Relative 8 %   Monocytes Absolute 3.5 (H) 0 - 1 K/uL   Eosinophils Relative 0 %   Eosinophils Absolute 0.0 0 - 0 K/uL   Basophils Relative 0 %   Basophils Absolute 0.2 (H) 0 - 0 K/uL   WBC Morphology MILD LEFT SHIFT (1-5% METAS, OCC MYELO, OCC BANDS)    RBC Morphology MORPHOLOGY UNREMARKABLE    Smear Review Normal platelet morphology    Immature Granulocytes 3 %   Abs Immature Granulocytes 1.16 (H) 0.00 - 0.07 K/uL    Comment: Performed at  [facility], [address] [place], [place] [address]  APTT     Status: None   Collection Time: 04/02/20  6:47 PM  Result Value Ref Range   aPTT 26 24 - 36 seconds    Comment: Performed at [facility], [address] [place], [place] [address]  Urine culture     Status: Abnormal (Preliminary result)   Collection Time: 04/02/20  6:47 PM   Specimen: In/Out Cath Urine  Result Value Ref Range   Specimen Description      IN/OUT CATH URINE Performed at [facility], [address] [place], [place] [address]    Special Requests      NONE Performed at [facility], [address] [place], [place] [address]    Culture >=100,000 COLONIES/mL GRAM NEGATIVE RODS (A)    Report Status PENDING   Pregnancy, urine     Status: None   Collection Time: 04/02/20  6:47 PM  Result Value Ref Range   Preg Test, Ur NEGATIVE NEGATIVE    Comment:        THE SENSITIVITY OF THIS METHODOLOGY IS >20 mIU/mL. Performed at [facility], [address] [place], [place] [address]   Lipase, blood     Status: Abnormal   Collection Time: 04/02/20  6:47 PM  Result Value Ref Range   Lipase 154 (H) 11 - 51 U/L    Comment: Performed at [facility], [address] [place], [place] [address]  Blood Culture (routine x 2)     Status: None (Preliminary result)   Collection Time: 04/02/20  7:00 PM   Specimen: BLOOD  Result Value Ref Range   Specimen Description      BLOOD BLOOD RIGHT HAND Performed at [facility], [address] [place], [place] [address]    Special Requests      BOTTLES DRAWN AEROBIC ONLY Blood Culture results May not be optimal due to an inadequate volume of blood received in culture bottles Performed at [facility], [address] [place], [place] [address]    Culture      NO GROWTH 2 DAYS Performed at [facility], [address] [place], [place] [address]    Report Status PENDING   Lactic acid, plasma     Status: Abnormal    Collection Time: 04/02/20  9:29 PM  Result Value Ref Range   Lactic Acid, Venous 3.3 (HH) 0.5 - 1.9 mmol/L    Comment: CRITICAL VALUE NOTED.  VALUE IS CONSISTENT WITH PREVIOUSLY REPORTED AND CALLED VALUE. Performed at [facility], [address] [place], [place] [address]   SARS Coronavirus 2 by RT PCR (hospital order, performed in [facility] hospital lab) Nasopharyngeal Nasopharyngeal Swab     Status: None   Collection Time: 04/02/20 11:13 PM   Specimen: Nasopharyngeal Swab  Result Value Ref Range   SARS Coronavirus 2 NEGATIVE NEGATIVE    Comment: (NOTE) SARS-CoV-2 target nucleic acids are NOT DETECTED.  The SARS-CoV-2 RNA is generally detectable in upper and lower respiratory specimens during the acute phase of infection. The lowest concentration of SARS-CoV-2 viral copies this assay can detect is 250 copies / mL. A negative result does not preclude SARS-CoV-2 infection and should not be used as the sole basis for treatment or other patient management decisions.  A negative result May occur with  improper specimen collection / handling, submission of specimen other than nasopharyngeal swab, presence of viral mutation(s) within the areas targeted by this assay, and inadequate number of viral copies (<250 copies / mL). A negative result must be combined with clinical observations, patient history, and epidemiological information.  Fact Sheet for Patients:   BoilerBrush.com.cy  Fact Sheet for Healthcare Providers: https://pope.com/  This test is not yet approved or  cleared by the [place] FDA and has been authorized for detection and/or diagnosis of SARS-CoV-2 by FDA under an Emergency Use Authorization (EUA).  This EUA will remain in effect (meaning this test can be used) for the duration of the COVID-19 declaration under Section 564(b)(1) of the Act, 21 U.S.C. section 360bbb-3(b)(1), unless the authorization is  terminated or revoked sooner.  Performed at [facility], [address] [place], [place] [address]   HIV Antibody (routine testing w rflx)     Status: None   Collection Time: 04/03/20  7:09 AM  Result Value Ref Range   HIV Screen 4th Generation wRfx Non Reactive Non Reactive    Comment: Performed at [facility], [address] [place], [place] [address]  Hepatic function panel     Status: Abnormal   Collection Time: 04/03/20  7:09 AM  Result Value Ref Range   Total Protein 5.8 (L) 6.5 - 8.1 g/dL   Albumin 2.8 (L) 3.5 - 5.0 g/dL   AST 33 15 - 41 U/L   ALT 101 (H) 0 - 44 U/L   Alkaline Phosphatase 96 38 - 126 U/L   Total Bilirubin 2.4 (H) 0.3 - 1.2 mg/dL   Bilirubin, Direct 1.3 (H) 0.0 - 0.2 mg/dL   Indirect Bilirubin 1.1 (H) 0.3 - 0.9 mg/dL    Comment: Performed at [facility] [address], [place], [place] [address]  Basic metabolic panel     Status: Abnormal   Collection Time: 04/03/20  7:09 AM  Result Value Ref Range   Sodium 135 135 - 145 mmol/L   Potassium 4.1 3.5 - 5.1 mmol/L   Chloride 106 98 - 111 mmol/L   CO2 18 (L) 22 - 32 mmol/L   Glucose, Bld 172 (H) 70 - 99 mg/dL    Comment: Glucose reference range applies only to samples taken after fasting for at least 8 hours.   BUN 21 (H) 6 - 20 mg/dL   Creatinine, Ser 9.14 (H) 0.44 - 1.00 mg/dL   Calcium 6.4 (LL) 8.9 - 10.3 mg/dL    Comment: CRITICAL RESULT CALLED TO, READ BACK BY AND VERIFIED WITH: KOONTZ,A. RN AT 7829 04/03/20 MULLINS,T    GFR calc non Af Amer 55 (L) >60 mL/min   GFR calc Af Amer >60 >60 mL/min   Anion gap 11 5 - 15    Comment: Performed at [facility], [address] [place], [place] [address]  CBC WITH DIFFERENTIAL     Status: Abnormal   Collection Time: 04/03/20  7:09 AM  Result Value Ref Range   WBC 39.3 (H) 4.0 - 10.5 K/uL   RBC 6.08 (H) 3.87 - 5.11 MIL/uL   Hemoglobin 16.0 (H) 12.0 - 15.0 g/dL   HCT 08.6 (H) 36 - 46 %   MCV 83.1  80.0 - 100.0 fL   MCH 26.3 26.0 - 34.0 pg   MCHC 31.7 30.0 - 36.0 g/dL   RDW 57.8 (H) 46.9 - 62.9 %   Platelets 333 150 - 400 K/uL  nRBC 0.0 0.0 - 0.2 %   Neutrophils Relative % 87 %   Neutro Abs 34.2 (H) 1.7 - 7.7 K/uL   Lymphocytes Relative 5 %   Lymphs Abs 1.8 0.7 - 4.0 K/uL   Monocytes Relative 6 %   Monocytes Absolute 2.4 (H) 0 - 1 K/uL   Eosinophils Relative 0 %   Eosinophils Absolute 0.0 0 - 0 K/uL   Basophils Relative 0 %   Basophils Absolute 0.1 0 - 0 K/uL   WBC Morphology INCREASED BANDS (>20% BANDS)    Immature Granulocytes 2 %   Abs Immature Granulocytes 0.72 (H) 0.00 - 0.07 K/uL    Comment: Performed at [facility], [place] [address] [place], [place] [address]  TSH     Status: None   Collection Time: 04/03/20  7:09 AM  Result Value Ref Range   TSH 2.379 0.350 - 4.500 uIU/mL    Comment: Performed by a 3rd Generation assay with a functional sensitivity of <=0.01 uIU/mL. Performed at [facility], [place] [address] [place], [place] [address]   Lactic acid, plasma     Status: Abnormal   Collection Time: 04/03/20  7:09 AM  Result Value Ref Range   Lactic Acid, Venous 2.2 (HH) 0.5 - 1.9 mmol/L    Comment: CRITICAL RESULT CALLED TO, READ BACK BY AND VERIFIED WITH: KOONTZ,A. RN AT 2355 04/03/20 MULLINS,T Performed at [facility], [place] [address], [place], [place] [address]   Troponin I (High Sensitivity)     Status: Abnormal   Collection Time: 04/03/20  7:09 AM  Result Value Ref Range   Troponin I (High Sensitivity) 54 (H) <18 ng/L    Comment: (NOTE) Elevated high sensitivity troponin I (hsTnI) values and significant  changes across serial measurements May suggest ACS but many other  chronic and acute conditions are known to elevate hsTnI results.  Refer to the Links section for chest pain algorithms and additional  guidance. Performed at [facility], [place] [address], [place], [place]  [address]   C-reactive protein     Status: Abnormal   Collection Time: 04/03/20  7:09 AM  Result Value Ref Range   CRP 30.1 (H) <1.0 mg/dL    Comment: Performed at [facility], [place] [address] [place], [place] [address]  Procalcitonin - Baseline     Status: None   Collection Time: 04/03/20  7:09 AM  Result Value Ref Range   Procalcitonin 1.45 ng/mL    Comment:        Interpretation: PCT > 0.5 ng/mL and <= 2 ng/mL: Systemic infection (sepsis) is possible, but other conditions are known to elevate PCT as well. (NOTE)       Sepsis PCT Algorithm           Lower Respiratory Tract                                      Infection PCT Algorithm    ----------------------------     ----------------------------         PCT < 0.25 ng/mL                PCT < 0.10 ng/mL          Strongly encourage             Strongly discourage   discontinuation of antibiotics    initiation  of antibiotics    ----------------------------     -----------------------------       PCT 0.25 - 0.50 ng/mL            PCT 0.10 - 0.25 ng/mL               OR       >80% decrease in PCT            Discourage initiation of                                            antibiotics      Encourage discontinuation           of antibiotics    ----------------------------     -----------------------------         PCT >= 0.50 ng/mL              PCT 0.26 - 0.50 ng/mL                AND       <80% decrease in PCT             Encourage initiation of                                             antibiotics       Encourage continuation           of antibiotics    ----------------------------     -----------------------------        PCT >= 0.50 ng/mL                  PCT > 0.50 ng/mL               AND         increase in PCT                  Strongly encourage                                      initiation of antibiotics    Strongly encourage escalation           of antibiotics                                      -----------------------------                                           PCT <= 0.25 ng/mL                                                 OR                                        >  80% decrease in PCT                                      Discontinue / Do not initiate                                             antibiotics  Performed at [facility], [address], [place], [place] [address]   Triglycerides     Status: Abnormal   Collection Time: 04/03/20  7:09 AM  Result Value Ref Range   Triglycerides 176 (H) <150 mg/dL    Comment: Performed at [facility], [address] [place], [place] [address]  Hemoglobin A1c     Status: None   Collection Time: 04/03/20  7:09 AM  Result Value Ref Range   Hgb A1c MFr Bld 5.4 4.8 - 5.6 %    Comment: (NOTE) Pre diabetes:          5.7%-6.4%  Diabetes:              >6.4%  Glycemic control for   <7.0% adults with diabetes    Mean Plasma Glucose 108.28 mg/dL    Comment: Performed at [facility], [address] [place], [place] [address]  Magnesium     Status: Abnormal   Collection Time: 04/03/20  7:09 AM  Result Value Ref Range   Magnesium 1.5 (L) 1.7 - 2.4 mg/dL    Comment: Performed at [facility], [address] [place], [place] [address]  Glucose, capillary     Status: Abnormal   Collection Time: 04/03/20  9:05 AM  Result Value Ref Range   Glucose-Capillary 154 (H) 70 - 99 mg/dL    Comment: Glucose reference range applies only to samples taken after fasting for at least 8 hours.  Lactic acid, plasma     Status: Abnormal   Collection Time: 04/03/20  9:38 AM  Result Value Ref Range   Lactic Acid, Venous 2.6 (HH) 0.5 - 1.9 mmol/L    Comment: CRITICAL VALUE NOTED.  VALUE IS CONSISTENT WITH PREVIOUSLY REPORTED AND CALLED VALUE. Performed at [facility], [address] [place], [place] [address]   Lactic acid, plasma     Status: Abnormal    Collection Time: 04/03/20  1:37 PM  Result Value Ref Range   Lactic Acid, Venous 2.2 (HH) 0.5 - 1.9 mmol/L    Comment: CRITICAL VALUE NOTED.  VALUE IS CONSISTENT WITH PREVIOUSLY REPORTED AND CALLED VALUE. Performed at [facility], [address] [place], [place] [address]   Glucose, capillary     Status: Abnormal   Collection Time: 04/03/20  4:22 PM  Result Value Ref Range   Glucose-Capillary 149 (H) 70 - 99 mg/dL    Comment: Glucose reference range applies only to samples taken after fasting for at least 8 hours.  Lactic acid, plasma     Status: None   Collection Time: 04/03/20  5:52 PM  Result Value Ref Range   Lactic Acid, Venous 1.9 0.5 - 1.9 mmol/L    Comment: Performed at [facility], [address], [place], [place] [address]  Glucose, capillary     Status: Abnormal   Collection Time: 04/03/20  9:53 PM  Result Value  Ref Range   Glucose-Capillary 141 (H) 70 - 99 mg/dL    Comment: Glucose reference range applies only to samples taken after fasting for at least 8 hours.  CBC with Differential/Platelet     Status: Abnormal   Collection Time: 04/03/20 10:31 PM  Result Value Ref Range   WBC 38.3 (H) 4.0 - 10.5 K/uL   RBC 5.90 (H) 3.87 - 5.11 MIL/uL   Hemoglobin 15.5 (H) 12.0 - 15.0 g/dL   HCT 78.2 (H) 36 - 46 %   MCV 82.5 80.0 - 100.0 fL   MCH 26.3 26.0 - 34.0 pg   MCHC 31.8 30.0 - 36.0 g/dL   RDW 95.6 (H) 21.3 - 08.6 %   Platelets 289 150 - 400 K/uL   nRBC 0.0 0.0 - 0.2 %   Neutrophils Relative % 88 %   Neutro Abs 33.8 (H) 1.7 - 7.7 K/uL   Lymphocytes Relative 5 %   Lymphs Abs 1.8 0.7 - 4.0 K/uL   Monocytes Relative 6 %   Monocytes Absolute 2.2 (H) 0 - 1 K/uL   Eosinophils Relative 0 %   Eosinophils Absolute 0.0 0 - 0 K/uL   Basophils Relative 0 %   Basophils Absolute 0.2 (H) 0 - 0 K/uL   Immature Granulocytes 1 %   Abs Immature Granulocytes 0.43 (H) 0.00 - 0.07 K/uL   Polychromasia PRESENT     Comment: Performed at [facility], [address], [place], [place] [address]  CBC with Differential/Platelet     Status: Abnormal   Collection Time: 04/04/20  3:18 AM  Result Value Ref Range   WBC 34.6 (H) 4.0 - 10.5 K/uL   RBC 5.81 (H) 3.87 - 5.11 MIL/uL   Hemoglobin 15.0 12.0 - 15.0 g/dL   HCT 96.2 (H) 36 - 46 %   MCV 82.8 80.0 - 100.0 fL   MCH 25.8 (L) 26.0 - 34.0 pg   MCHC 31.2 30.0 - 36.0 g/dL   RDW 95.2 (H) 84.1 - 32.4 %   Platelets 305 150 - 400 K/uL   nRBC 0.0 0.0 - 0.2 %   Neutrophils Relative % 87 %   Neutro Abs 30.0 (H) 1.7 - 7.7 K/uL   Lymphocytes Relative 5 %   Lymphs Abs 1.9 0.7 - 4.0 K/uL   Monocytes Relative 6 %   Monocytes Absolute 2.0 (H) 0 - 1 K/uL   Eosinophils Relative 0 %   Eosinophils Absolute 0.0 0 - 0 K/uL   Basophils Relative 0 %   Basophils Absolute 0.1 0 - 0 K/uL   Immature Granulocytes 2 %   Abs Immature Granulocytes 0.59 (H) 0.00 - 0.07 K/uL    Comment: Performed at [facility], [address] [place], [place] [address]  Comprehensive metabolic panel     Status: Abnormal   Collection Time: 04/04/20  3:18 AM  Result Value Ref Range   Sodium 133 (L) 135 - 145 mmol/L   Potassium 4.0 3.5 - 5.1 mmol/L   Chloride 103 98 - 111 mmol/L   CO2 22 22 - 32 mmol/L   Glucose, Bld 149 (H) 70 - 99 mg/dL    Comment: Glucose reference range applies only to samples taken after fasting for at least 8 hours.   BUN 16 6 - 20 mg/dL   Creatinine, Ser 7.25 (H) 0.44 - 1.00 mg/dL   Calcium 6.7 (L) 8.9 - 10.3 mg/dL   Total Protein 5.6 (L) 6.5 - 8.1 g/dL  Albumin 2.3 (L) 3.5 - 5.0 g/dL   AST 20 15 - 41 U/L   ALT 59 (H) 0 - 44 U/L   Alkaline Phosphatase 89 38 - 126 U/L   Total Bilirubin 2.0 (H) 0.3 - 1.2 mg/dL   GFR calc non Af Amer >60 >60 mL/min   GFR calc Af Amer >60 >60 mL/min   Anion gap 8 5 - 15    Comment: Performed at [facility], [address], [place], [place] [address]  Magnesium     Status: None   Collection Time: 04/04/20   3:18 AM  Result Value Ref Range   Magnesium 2.0 1.7 - 2.4 mg/dL    Comment: Performed at [facility], [address] [place], [place] [address]  Phosphorus     Status: Abnormal   Collection Time: 04/04/20  3:18 AM  Result Value Ref Range   Phosphorus 2.1 (L) 2.5 - 4.6 mg/dL    Comment: Performed at [facility], [address] [place], [place] [address]  Lactic acid, plasma     Status: None   Collection Time: 04/04/20  3:18 AM  Result Value Ref Range   Lactic Acid, Venous 1.9 0.5 - 1.9 mmol/L    Comment: Performed at [facility], [address], [place], [place] [address]  Procalcitonin     Status: None   Collection Time: 04/04/20  3:18 AM  Result Value Ref Range   Procalcitonin 1.16 ng/mL    Comment:        Interpretation: PCT > 0.5 ng/mL and <= 2 ng/mL: Systemic infection (sepsis) is possible, but other conditions are known to elevate PCT as well. (NOTE)       Sepsis PCT Algorithm           Lower Respiratory Tract                                      Infection PCT Algorithm    ----------------------------     ----------------------------         PCT < 0.25 ng/mL                PCT < 0.10 ng/mL          Strongly encourage             Strongly discourage   discontinuation of antibiotics    initiation of antibiotics    ----------------------------     -----------------------------       PCT 0.25 - 0.50 ng/mL            PCT 0.10 - 0.25 ng/mL               OR       >80% decrease in PCT            Discourage initiation of                                            antibiotics      Encourage discontinuation           of antibiotics    ----------------------------     -----------------------------         PCT >= 0.50 ng/mL  PCT 0.26 - 0.50 ng/mL                AND       <80% decrease in PCT             Encourage initiation of                                             antibiotics       Encourage  continuation           of antibiotics    ----------------------------     -----------------------------        PCT >= 0.50 ng/mL                  PCT > 0.50 ng/mL               AND         increase in PCT                  Strongly encourage                                      initiation of antibiotics    Strongly encourage escalation           of antibiotics                                     -----------------------------                                           PCT <= 0.25 ng/mL                                                 OR                                        > 80% decrease in PCT                                      Discontinue / Do not initiate                                             antibiotics  Performed at [facility], [address] [place], [place] [id number]   Glucose, capillary     Status: Abnormal   Collection Time: 04/04/20  5:47 AM  Result Value Ref Range   Glucose-Capillary 163 (H) 70 - 99 mg/dL    Comment: Glucose reference range applies only to samples taken after fasting for at least 8 hours.  Lipase, blood     Status: None   Collection Time: 04/04/20  7:25  AM  Result Value Ref Range   Lipase 44 11 - 51 U/L    Comment: Performed at [facility], [address] [place], [place] [address]  Glucose, capillary     Status: Abnormal   Collection Time: 04/04/20  7:29 AM  Result Value Ref Range   Glucose-Capillary 153 (H) 70 - 99 mg/dL    Comment: Glucose reference range applies only to samples taken after fasting for at least 8 hours.    CT ABDOMEN PELVIS WO CONTRAST  Result Date: 04/02/2020 CLINICAL DATA:  Abdominal distension. EXAM: CT ABDOMEN AND PELVIS WITHOUT CONTRAST TECHNIQUE: Multidetector CT imaging of the abdomen and pelvis was performed following the standard protocol without IV contrast. COMPARISON:  None. FINDINGS: Lower chest: Motion artifact through the lung bases. Elevation of right hemidiaphragm  with compressive atelectasis. Possible trace right pleural effusion. Hepatobiliary: Enlarged liver spanning 20 cm cranial caudal with mild steatosis. No evidence of discrete focal lesion. Motion limits assessment for capsular nodularity. Layering hyperdensity in the gallbladder. Ascites adjacent to the gallbladder which is similar to the ascites adjacent to the liver. Common bile duct is not well-defined, there is no obvious biliary dilatation. Pancreas: Peripancreatic fat stranding and edema adjacent to the head and uncinate process. Small amount of non organized peripancreatic fluid. No ductal dilatation. No evidence of acute peripancreatic collection. Cannot assess for necrosis in the absence of IV contrast. Spleen: Normal in size without focal abnormality. Adrenals/Urinary Tract: Normal adrenal glands. No renal stones or hydronephrosis. Both ureters are decompressed without stones along the course. Urinary bladder is nondistended. Stomach/Bowel: Fluid/ingested material in the stomach. Mild duodenal wall thickening in the fourth portion adjacent to the pancreatic inflammation. There is no small bowel obstruction or inflammatory change. Detailed bowel assessment is limited in the absence of contrast and presence of abdominal ascites. The majority of the colon is decompressed. Vascular/Lymphatic: Normal caliber abdominal aorta. Few prominent periportal lymph nodes are likely reactive. Reproductive: Abnormal low-density in the lower uterine segment/cervix spanning 18 mm, nonspecific. No evidence of adnexal mass. Other: Small to moderate volume of abdominopelvic ascites. There is free fluid in both upper quadrants, both pericolic gutters, mesentery, and tracking into the pelvis. No free air or organized fluid collection. Small fat containing umbilical hernia. Musculoskeletal: Degenerative change in the spine. There are no acute or suspicious osseous abnormalities. IMPRESSION: 1. Acute edematous pancreatitis. No  evidence of acute peripancreatic collection. 2. Hepatomegaly and hepatic steatosis. Layering hyperdensity in the gallbladder May represent sludge or layering stones. 3. Small to moderate volume of abdominopelvic ascites. This May be in part related to pancreatitis, underlying liver disease, or combination thereof. 4. Abnormal low-density in the lower uterine segment/cervix spanning 18 mm, nonspecific. Recommend nonemergent pelvic ultrasound for characterization on an elective outpatient basis. Electronically Signed   By: Narda Rutherford M.D.   On: 04/02/2020 20:06   MR ABDOMEN MRCP WO CONTRAST  Result Date: 04/03/2020 CLINICAL DATA:  Cholelithiasis. Known pancreatitis. Patient had difficulty breathing and was unable to complete the last series of the exam. EXAM: MRI ABDOMEN WITHOUT CONTRAST  (INCLUDING MRCP) TECHNIQUE: Multiplanar multisequence MR imaging of the abdomen was performed. Heavily T2-weighted images of the biliary and pancreatic ducts were obtained, and three-dimensional MRCP images were rendered by post processing. COMPARISON:  CT scan 04/02/2020 FINDINGS: Lower chest: Bilateral pleural effusions associated with bibasilar collapse/consolidative changes. Hepatobiliary: Liver measures 20.8 cm craniocaudal length, enlarged. Loss of signal in the liver parenchyma on out of phase T1 imaging suggests fatty deposition.  No focal abnormality in the liver parenchyma on this noncontrast motion degraded study. Numerous tiny gallstones evident with apparent gallbladder wall thickening. No intrahepatic biliary duct dilatation. No extrahepatic biliary duct dilatation. Study is markedly limited, but no definite choledocholithiasis. Axial T2 haste imaging demonstrates some central flow artifact in the common duct, but no definite ductal stones. Pancreas: No main duct dilatation. No gross mass lesion in the pancreas. Mild edema seen in the region of the pancreatic head. Spleen:  No splenomegaly. No focal mass  lesion. Adrenals/Urinary Tract: No adrenal nodule or mass. No hydronephrosis. No gross renal mass on this noncontrast study. Probable 11 mm cyst interpolar left kidney. Stomach/Bowel: Stomach is unremarkable. No gastric wall thickening. No evidence of outlet obstruction. Duodenum is normally positioned as is the ligament of Treitz. No small bowel or colonic dilatation within the visualized abdomen. Vascular/Lymphatic: No abdominal aortic aneurysm. There is no gastrohepatic or hepatoduodenal ligament lymphadenopathy. No retroperitoneal or mesenteric lymphadenopathy. Other:  Moderate volume intraperitoneal free fluid. Musculoskeletal: No suspicious marrow signal abnormality within the visualized bony anatomy. IMPRESSION: 1. Limited study due to motion artifact and lack of intravenous contrast. Patient was unable to complete the entire exam. 2. Cholelithiasis with apparent gallbladder wall thickening. No intrahepatic or extrahepatic biliary duct dilatation. No definite choledocholithiasis. If there is clinical concern for acute cholecystitis, abdominal ultrasound or nuclear scintigraphy May prove helpful to further evaluate. 3. Mild edema in the region of the pancreatic head. No main duct dilatation. Imaging features May be related to pancreatitis. 4. Hepatomegaly with steatosis. 5. Moderate volume intraperitoneal free fluid. 6. Bilateral pleural effusions associated with bibasilar collapse/consolidative changes. Electronically Signed   By: Kennith Center M.D.   On: 04/03/2020 12:02   MR 3D Recon At Scanner  Result Date: 04/03/2020 CLINICAL DATA:  Cholelithiasis. Known pancreatitis. Patient had difficulty breathing and was unable to complete the last series of the exam. EXAM: MRI ABDOMEN WITHOUT CONTRAST  (INCLUDING MRCP) TECHNIQUE: Multiplanar multisequence MR imaging of the abdomen was performed. Heavily T2-weighted images of the biliary and pancreatic ducts were obtained, and three-dimensional MRCP images were  rendered by post processing. COMPARISON:  CT scan 04/02/2020 FINDINGS: Lower chest: Bilateral pleural effusions associated with bibasilar collapse/consolidative changes. Hepatobiliary: Liver measures 20.8 cm craniocaudal length, enlarged. Loss of signal in the liver parenchyma on out of phase T1 imaging suggests fatty deposition. No focal abnormality in the liver parenchyma on this noncontrast motion degraded study. Numerous tiny gallstones evident with apparent gallbladder wall thickening. No intrahepatic biliary duct dilatation. No extrahepatic biliary duct dilatation. Study is markedly limited, but no definite choledocholithiasis. Axial T2 haste imaging demonstrates some central flow artifact in the common duct, but no definite ductal stones. Pancreas: No main duct dilatation. No gross mass lesion in the pancreas. Mild edema seen in the region of the pancreatic head. Spleen:  No splenomegaly. No focal mass lesion. Adrenals/Urinary Tract: No adrenal nodule or mass. No hydronephrosis. No gross renal mass on this noncontrast study. Probable 11 mm cyst interpolar left kidney. Stomach/Bowel: Stomach is unremarkable. No gastric wall thickening. No evidence of outlet obstruction. Duodenum is normally positioned as is the ligament of Treitz. No small bowel or colonic dilatation within the visualized abdomen. Vascular/Lymphatic: No abdominal aortic aneurysm. There is no gastrohepatic or hepatoduodenal ligament lymphadenopathy. No retroperitoneal or mesenteric lymphadenopathy. Other:  Moderate volume intraperitoneal free fluid. Musculoskeletal: No suspicious marrow signal abnormality within the visualized bony anatomy. IMPRESSION: 1. Limited study due to motion artifact and lack of intravenous contrast. Patient was  unable to complete the entire exam. 2. Cholelithiasis with apparent gallbladder wall thickening. No intrahepatic or extrahepatic biliary duct dilatation. No definite choledocholithiasis. If there is clinical  concern for acute cholecystitis, abdominal ultrasound or nuclear scintigraphy May prove helpful to further evaluate. 3. Mild edema in the region of the pancreatic head. No main duct dilatation. Imaging features May be related to pancreatitis. 4. Hepatomegaly with steatosis. 5. Moderate volume intraperitoneal free fluid. 6. Bilateral pleural effusions associated with bibasilar collapse/consolidative changes. Electronically Signed   By: Kennith Center M.D.   On: 04/03/2020 12:02   DG CHEST PORT 1 VIEW  Result Date: 04/04/2020 CLINICAL DATA:  Acute respiratory failure EXAM: PORTABLE CHEST 1 VIEW COMPARISON:  April 03, 2020 FINDINGS: Bilateral pleural effusions with underlying opacities. No other changes. IMPRESSION: Stable bilateral effusions with underlying opacities, favored represent atelectasis. No other change. Recommend follow-up to resolution. Electronically Signed   By: Gerome Sam III M.D   On: 04/04/2020 08:37   DG CHEST PORT 1 VIEW  Result Date: 04/03/2020 CLINICAL DATA:  Hypoxia, shortness of breath EXAM: PORTABLE CHEST 1 VIEW COMPARISON:  04/02/2020 FINDINGS: Examination is significantly limited secondary to poor penetration from patient body habitus, semiupright portable technique, and poor inspiratory effort. Very low lung volumes with accentuation of the heart size. Hazy and streaky bibasilar opacities suggesting small bilateral pleural effusions with bibasilar atelectasis. No pneumothorax. IMPRESSION: Limited examination. Hazy and streaky bibasilar opacities suggesting small bilateral pleural effusions with bibasilar atelectasis. Electronically Signed   By: Duanne Guess D.O.   On: 04/03/2020 13:59   DG Chest Port 1 View  Result Date: 04/02/2020 CLINICAL DATA:  Sepsis EXAM: PORTABLE CHEST 1 VIEW COMPARISON:  None. FINDINGS: Low lung volume with hypoventilatory change. Atelectasis at the right base. Borderline cardiomegaly augmented by low lung volume. No pneumothorax. IMPRESSION: Low  lung volume with atelectasis at the right base. Electronically Signed   By: Jasmine Pang M.D.   On: 04/02/2020 20:08   US Abdomen Limited RUQ  Result Date: 04/03/2020 CLINICAL DATA:  Pancreatitis. EXAM: ULTRASOUND ABDOMEN LIMITED RIGHT UPPER QUADRANT COMPARISON:  MRCP 04/03/2020 FINDINGS: The examination is limited due to patient body habitus. Gallbladder: Cholecystolithiasis. Thickened appearance of the gallbladder wall measuring 5 mm. No sonographic Eulah Pont sign is elicited by the scanning technologist. Common bile duct: Diameter: The common duct is dilated measuring 7 mm in diameter. Liver: Generalized increased hepatic parenchymal echogenicity. No definite focal liver lesion is identified. Portal vein is patent on color Doppler imaging with normal direction of blood flow towards the liver. Other: Moderate ascites within the visualized abdomen. IMPRESSION: Limited examination as described. There is cholecystolithiasis. Additionally, there is a thickened appearance of the gallbladder wall measuring 5 mm. Acute cholecystitis cannot be excluded, although gallbladder wall thickening is a nonspecific finding in the setting of abdominal ascites, and a nuclear medicine HIDA scan May be helpful for further assessment. Mild extrahepatic biliary ductal dilatation with the common duct measuring 7 mm in diameter. Hyperechogenicity of the hepatic parenchyma. Findings are consistent with the hepatic steatosis demonstrated on same day MRCP. Moderate ascites within the visualized abdomen. Electronically Signed   By: Jackey Loge DO   On: 04/03/2020 17:05    Review of Systems  Constitutional: Negative.   HENT: Negative.   Eyes: Negative.   Respiratory: Negative.   Cardiovascular:       Heart racing.    Gastrointestinal: Positive for abdominal pain and nausea.  Endocrine: Negative.   Genitourinary: Negative.   Musculoskeletal: Negative.  Allergic/Immunologic: Negative.   Neurological: Negative.    Hematological: Negative.   Psychiatric/Behavioral: Negative.   All other systems reviewed and are negative.  Blood pressure (!) 134/98, pulse (!) 118, temperature (!) 100.4 F (38 C), resp. rate (!) 28, height  (1.575 m), weight (!) 146 kg, last menstrual period 03/09/2020, SpO2 92 %. Physical Exam Constitutional:      General: She is in acute distress (mild).     Appearance: She is well-developed. She is obese. She is ill-appearing. She is not toxic-appearing or diaphoretic.  HENT:     Head: Normocephalic and atraumatic.     Mouth/Throat:     Pharynx: Oropharynx is clear.  Eyes:     General: No scleral icterus.    Extraocular Movements: Extraocular movements intact.     Pupils: Pupils are equal, round, and reactive to light.  Cardiovascular:     Rate and Rhythm: Regular rhythm. Tachycardia present.     Heart sounds: Normal heart sounds. No murmur heard.   Pulmonary:     Effort: Pulmonary effort is normal.     Breath sounds: Normal breath sounds. No stridor. No rhonchi.  Chest:     Chest wall: No tenderness.  Abdominal:     General: Abdomen is flat. Bowel sounds are normal. There is no distension or abdominal bruit. There are no signs of injury.     Palpations: Abdomen is soft. There is no shifting dullness or fluid wave.     Tenderness: There is no abdominal tenderness. There is no guarding or rebound. Negative signs include Murphy's sign and McBurney's sign.  Skin:    General: Skin is warm.     Capillary Refill: Capillary refill takes less than 2 seconds.     Coloration: Skin is not cyanotic, jaundiced, mottled or pale.     Findings: No erythema or rash.  Neurological:     General: No focal deficit present.     Mental Status: She is alert and oriented to person, place, and time.  Psychiatric:        Mood and Affect: Mood normal. Mood is not anxious or depressed.        Behavior: Behavior normal.     Assessment/Plan:  Biliary pancreatitis Possible  cholecystitis.  Recommend continuing antibiotics.   Continue resuscitation.  Will need lap chole at some point this admission.  WBCs likely secondary to pancreatitis.   Less tender today.  This would ideally be done by the bariatric team.    Surgery would also be better with improved respiratory status.    Almond Lint 04/04/2020, 9:30 AM

## 2020-04-04 NOTE — Progress Notes (Signed)
Progress Note for [facility]  Subjective: Feeling better.  Trying to get some sleep.  Objective: Vital signs in last 24 hours: Temp:  [98.2 F (36.8 C)-101.3 F (38.5 C)] 100.4 F (38 C) (06/26 0702) Pulse Rate:  [113-125] 118 (06/26 0702) Resp:  [26-54] 28 (06/26 0702) BP: (97-154)/(58-110) 134/98 (06/26 0700) SpO2:  [91 %-94 %] 92 % (06/26 0702) Weight:  [146 kg] 146 kg (06/26 0500) Last BM Date:  (PTA per patient)  Intake/Output from previous day: 06/25 0701 - 06/26 0700 In: 4597.7 [I.V.:2812.7; IV Piggyback:1785] Out: 905 [Urine:905] Intake/Output this shift: No intake/output data recorded.  General appearance: alert and no distress Resp: clear to auscultation bilaterally and tachynic Cardio: regular rate and rhythm and tachycardic GI: obese, soft, some upper abdominal tenderness Extremities: extremities normal, atraumatic, no cyanosis or edema  Lab Results: Recent Labs    04/03/20 0709 04/03/20 2231 04/04/20 0318  WBC 39.3* 38.3* 34.6*  HGB 16.0* 15.5* 15.0  HCT 50.5* 48.7* 48.1*  PLT 333 289 305   BMET Recent Labs    04/02/20 1847 04/03/20 0709 04/04/20 0318  NA 136 135 133*  K 3.6 4.1 4.0  CL 100 106 103  CO2 17* 18* 22  GLUCOSE 228* 172* 149*  BUN 20 21* 16  CREATININE 1.98* 1.22* 1.01*  CALCIUM 7.8* 6.4* 6.7*   LFT Recent Labs    04/03/20 0709 04/03/20 0709 04/04/20 0318  PROT 5.8*   < > 5.6*  ALBUMIN 2.8*   < > 2.3*  AST 33   < > 20  ALT 101*   < > 59*  ALKPHOS 96   < > 89  BILITOT 2.4*   < > 2.0*  BILIDIR 1.3*  --   --   IBILI 1.1*  --   --    < > = values in this interval not displayed.   PT/INR No results for input(s): LABPROT, INR in the last 72 hours. Hepatitis Panel No results for input(s): HEPBSAG, HCVAB, HEPAIGM, HEPBIGM in the last 72 hours. C-Diff No results for input(s): CDIFFTOX in the last 72 hours. Fecal Lactopherrin No results for input(s): FECLLACTOFRN in the last 72 hours.  Studies/Results: CT ABDOMEN PELVIS  WO CONTRAST  Result Date: 04/02/2020 CLINICAL DATA:  Abdominal distension. EXAM: CT ABDOMEN AND PELVIS WITHOUT CONTRAST TECHNIQUE: Multidetector CT imaging of the abdomen and pelvis was performed following the standard protocol without IV contrast. COMPARISON:  None. FINDINGS: Lower chest: Motion artifact through the lung bases. Elevation of right hemidiaphragm with compressive atelectasis. Possible trace right pleural effusion. Hepatobiliary: Enlarged liver spanning 20 cm cranial caudal with mild steatosis. No evidence of discrete focal lesion. Motion limits assessment for capsular nodularity. Layering hyperdensity in the gallbladder. Ascites adjacent to the gallbladder which is similar to the ascites adjacent to the liver. Common bile duct is not well-defined, there is no obvious biliary dilatation. Pancreas: Peripancreatic fat stranding and edema adjacent to the head and uncinate process. Small amount of non organized peripancreatic fluid. No ductal dilatation. No evidence of acute peripancreatic collection. Cannot assess for necrosis in the absence of IV contrast. Spleen: Normal in size without focal abnormality. Adrenals/Urinary Tract: Normal adrenal glands. No renal stones or hydronephrosis. Both ureters are decompressed without stones along the course. Urinary bladder is nondistended. Stomach/Bowel: Fluid/ingested material in the stomach. Mild duodenal wall thickening in the fourth portion adjacent to the pancreatic inflammation. There is no small bowel obstruction or inflammatory change. Detailed bowel assessment is limited in the absence of contrast  and presence of abdominal ascites. The majority of the colon is decompressed. Vascular/Lymphatic: Normal caliber abdominal aorta. Few prominent periportal lymph nodes are likely reactive. Reproductive: Abnormal low-density in the lower uterine segment/cervix spanning 18 mm, nonspecific. No evidence of adnexal mass. Other: Small to moderate volume of  abdominopelvic ascites. There is free fluid in both upper quadrants, both pericolic gutters, mesentery, and tracking into the pelvis. No free air or organized fluid collection. Small fat containing umbilical hernia. Musculoskeletal: Degenerative change in the spine. There are no acute or suspicious osseous abnormalities. IMPRESSION: 1. Acute edematous pancreatitis. No evidence of acute peripancreatic collection. 2. Hepatomegaly and hepatic steatosis. Layering hyperdensity in the gallbladder may represent sludge or layering stones. 3. Small to moderate volume of abdominopelvic ascites. This may be in part related to pancreatitis, underlying liver disease, or combination thereof. 4. Abnormal low-density in the lower uterine segment/cervix spanning 18 mm, nonspecific. Recommend nonemergent pelvic ultrasound for characterization on an elective outpatient basis. Electronically Signed   By: Narda Rutherford M.D.   On: 04/02/2020 20:06   MR ABDOMEN MRCP WO CONTRAST  Result Date: 04/03/2020 CLINICAL DATA:  Cholelithiasis. Known pancreatitis. Patient had difficulty breathing and was unable to complete the last series of the exam. EXAM: MRI ABDOMEN WITHOUT CONTRAST  (INCLUDING MRCP) TECHNIQUE: Multiplanar multisequence MR imaging of the abdomen was performed. Heavily T2-weighted images of the biliary and pancreatic ducts were obtained, and three-dimensional MRCP images were rendered by post processing. COMPARISON:  CT scan 04/02/2020 FINDINGS: Lower chest: Bilateral pleural effusions associated with bibasilar collapse/consolidative changes. Hepatobiliary: Liver measures 20.8 cm craniocaudal length, enlarged. Loss of signal in the liver parenchyma on out of phase T1 imaging suggests fatty deposition. No focal abnormality in the liver parenchyma on this noncontrast motion degraded study. Numerous tiny gallstones evident with apparent gallbladder wall thickening. No intrahepatic biliary duct dilatation. No extrahepatic  biliary duct dilatation. Study is markedly limited, but no definite choledocholithiasis. Axial T2 haste imaging demonstrates some central flow artifact in the common duct, but no definite ductal stones. Pancreas: No main duct dilatation. No gross mass lesion in the pancreas. Mild edema seen in the region of the pancreatic head. Spleen:  No splenomegaly. No focal mass lesion. Adrenals/Urinary Tract: No adrenal nodule or mass. No hydronephrosis. No gross renal mass on this noncontrast study. Probable 11 mm cyst interpolar left kidney. Stomach/Bowel: Stomach is unremarkable. No gastric wall thickening. No evidence of outlet obstruction. Duodenum is normally positioned as is the ligament of Treitz. No small bowel or colonic dilatation within the visualized abdomen. Vascular/Lymphatic: No abdominal aortic aneurysm. There is no gastrohepatic or hepatoduodenal ligament lymphadenopathy. No retroperitoneal or mesenteric lymphadenopathy. Other:  Moderate volume intraperitoneal free fluid. Musculoskeletal: No suspicious marrow signal abnormality within the visualized bony anatomy. IMPRESSION: 1. Limited study due to motion artifact and lack of intravenous contrast. Patient was unable to complete the entire exam. 2. Cholelithiasis with apparent gallbladder wall thickening. No intrahepatic or extrahepatic biliary duct dilatation. No definite choledocholithiasis. If there is clinical concern for acute cholecystitis, abdominal ultrasound or nuclear scintigraphy may prove helpful to further evaluate. 3. Mild edema in the region of the pancreatic head. No main duct dilatation. Imaging features may be related to pancreatitis. 4. Hepatomegaly with steatosis. 5. Moderate volume intraperitoneal free fluid. 6. Bilateral pleural effusions associated with bibasilar collapse/consolidative changes. Electronically Signed   By: Kennith Center M.D.   On: 04/03/2020 12:02   MR 3D Recon At Scanner  Result Date: 04/03/2020 CLINICAL DATA:   Cholelithiasis. Known  pancreatitis. Patient had difficulty breathing and was unable to complete the last series of the exam. EXAM: MRI ABDOMEN WITHOUT CONTRAST  (INCLUDING MRCP) TECHNIQUE: Multiplanar multisequence MR imaging of the abdomen was performed. Heavily T2-weighted images of the biliary and pancreatic ducts were obtained, and three-dimensional MRCP images were rendered by post processing. COMPARISON:  CT scan 04/02/2020 FINDINGS: Lower chest: Bilateral pleural effusions associated with bibasilar collapse/consolidative changes. Hepatobiliary: Liver measures 20.8 cm craniocaudal length, enlarged. Loss of signal in the liver parenchyma on out of phase T1 imaging suggests fatty deposition. No focal abnormality in the liver parenchyma on this noncontrast motion degraded study. Numerous tiny gallstones evident with apparent gallbladder wall thickening. No intrahepatic biliary duct dilatation. No extrahepatic biliary duct dilatation. Study is markedly limited, but no definite choledocholithiasis. Axial T2 haste imaging demonstrates some central flow artifact in the common duct, but no definite ductal stones. Pancreas: No main duct dilatation. No gross mass lesion in the pancreas. Mild edema seen in the region of the pancreatic head. Spleen:  No splenomegaly. No focal mass lesion. Adrenals/Urinary Tract: No adrenal nodule or mass. No hydronephrosis. No gross renal mass on this noncontrast study. Probable 11 mm cyst interpolar left kidney. Stomach/Bowel: Stomach is unremarkable. No gastric wall thickening. No evidence of outlet obstruction. Duodenum is normally positioned as is the ligament of Treitz. No small bowel or colonic dilatation within the visualized abdomen. Vascular/Lymphatic: No abdominal aortic aneurysm. There is no gastrohepatic or hepatoduodenal ligament lymphadenopathy. No retroperitoneal or mesenteric lymphadenopathy. Other:  Moderate volume intraperitoneal free fluid. Musculoskeletal: No  suspicious marrow signal abnormality within the visualized bony anatomy. IMPRESSION: 1. Limited study due to motion artifact and lack of intravenous contrast. Patient was unable to complete the entire exam. 2. Cholelithiasis with apparent gallbladder wall thickening. No intrahepatic or extrahepatic biliary duct dilatation. No definite choledocholithiasis. If there is clinical concern for acute cholecystitis, abdominal ultrasound or nuclear scintigraphy may prove helpful to further evaluate. 3. Mild edema in the region of the pancreatic head. No main duct dilatation. Imaging features may be related to pancreatitis. 4. Hepatomegaly with steatosis. 5. Moderate volume intraperitoneal free fluid. 6. Bilateral pleural effusions associated with bibasilar collapse/consolidative changes. Electronically Signed   By: Kennith Center M.D.   On: 04/03/2020 12:02   DG CHEST PORT 1 VIEW  Result Date: 04/03/2020 CLINICAL DATA:  Hypoxia, shortness of breath EXAM: PORTABLE CHEST 1 VIEW COMPARISON:  04/02/2020 FINDINGS: Examination is significantly limited secondary to poor penetration from patient body habitus, semiupright portable technique, and poor inspiratory effort. Very low lung volumes with accentuation of the heart size. Hazy and streaky bibasilar opacities suggesting small bilateral pleural effusions with bibasilar atelectasis. No pneumothorax. IMPRESSION: Limited examination. Hazy and streaky bibasilar opacities suggesting small bilateral pleural effusions with bibasilar atelectasis. Electronically Signed   By: Duanne Guess D.O.   On: 04/03/2020 13:59   DG Chest Port 1 View  Result Date: 04/02/2020 CLINICAL DATA:  Sepsis EXAM: PORTABLE CHEST 1 VIEW COMPARISON:  None. FINDINGS: Low lung volume with hypoventilatory change. Atelectasis at the right base. Borderline cardiomegaly augmented by low lung volume. No pneumothorax. IMPRESSION: Low lung volume with atelectasis at the right base. Electronically Signed   By:  Jasmine Pang M.D.   On: 04/02/2020 20:08   US Abdomen Limited RUQ  Result Date: 04/03/2020 CLINICAL DATA:  Pancreatitis. EXAM: ULTRASOUND ABDOMEN LIMITED RIGHT UPPER QUADRANT COMPARISON:  MRCP 04/03/2020 FINDINGS: The examination is limited due to patient body habitus. Gallbladder: Cholecystolithiasis. Thickened appearance of the gallbladder wall  measuring 5 mm. No sonographic Eulah Pont sign is elicited by the scanning technologist. Common bile duct: Diameter: The common duct is dilated measuring 7 mm in diameter. Liver: Generalized increased hepatic parenchymal echogenicity. No definite focal liver lesion is identified. Portal vein is patent on color Doppler imaging with normal direction of blood flow towards the liver. Other: Moderate ascites within the visualized abdomen. IMPRESSION: Limited examination as described. There is cholecystolithiasis. Additionally, there is a thickened appearance of the gallbladder wall measuring 5 mm. Acute cholecystitis cannot be excluded, although gallbladder wall thickening is a nonspecific finding in the setting of abdominal ascites, and a nuclear medicine HIDA scan may be helpful for further assessment. Mild extrahepatic biliary ductal dilatation with the common duct measuring 7 mm in diameter. Hyperechogenicity of the hepatic parenchyma. Findings are consistent with the hepatic steatosis demonstrated on same day MRCP. Moderate ascites within the visualized abdomen. Electronically Signed   By: Jackey Loge DO   On: 04/03/2020 17:05    Medications:  Scheduled: . Chlorhexidine Gluconate Cloth  6 each Topical Daily  . insulin aspart  0-6 Units Subcutaneous Q6H  . levothyroxine  75 mcg Intravenous Daily  . mouth rinse  15 mL Mouth Rinse BID   Continuous: . sodium chloride Stopped (04/03/20 1159)  . ceFEPime (MAXIPIME) IV Stopped (04/04/20 0641)  . lactated ringers    . potassium PHOSPHATE IVPB (in mmol)    . vancomycin Stopped (04/04/20 4627)     Assessment/Plan: 1) Acute pancreatitis presumed to be biliary in origin. 2) ABD pain. 3) Fever.   Clinically the patient does report feeling better, but she is still significantly tachycardic, tachypnic, and borderline hypoxemic with 6 liters of [place].  Portable CXR is pending for this AM.  She spiked a fever to 101 last evening.  She receiving LR at 125 ml/hour.  Last evening Dr. Adela Lank noted that she was at 250 ml/hour.  Her CXR this AM does not appear to be any different from yesterday's CXR.  Her creatinine has declined and there is some mild drop in her HCT from 50 down to 48.  BUN also declined from 20 down to 16.  UOP appears to be good at 905 ml, but it can improve.  Recommend: 1) Increase LR back to 250 ml/hour for another 6 hours and then reassess BUN, HCT, UOP, and vitals. 2) Okay to use Tylenol to treat fevers if necessary.   LOS: 1 day   Ricardo Schubach D 04/04/2020, 8:23 AM

## 2020-04-04 NOTE — Progress Notes (Signed)
Pharmacy Antibiotic Note  WINDA SUMMERALL is a 39 y.o. female admitted on 04/02/2020 with abdominal pain and subjective fever concerning for infection of unknown source.  Pharmacy has been consulted for Cefepime and vancomycin dosing. LA 3.9.   Plan: Increase Cefepime to 2gm q8h Vancomycin 2 gm once followed by1500 mg q12h  Height: 5\' 2"  (157.5 cm) Weight: (!) 146 kg (321 lb 14 oz) IBW/kg (Calculated) : 50.1  Temp (24hrs), Avg:100.4 F (38 C), Min:98.2 F (36.8 C), Max:101.3 F (38.5 C)  Recent Labs  Lab 04/02/20 1847 04/02/20 2129 04/03/20 0709 04/03/20 0938 04/03/20 1337 04/03/20 1752 04/03/20 2231 04/04/20 0318  WBC 45.6*  --  39.3*  --   --   --  38.3* 34.6*  CREATININE 1.98*  --  1.22*  --   --   --   --  1.01*  LATICACIDVEN 3.9*   < > 2.2* 2.6* 2.2* 1.9  --  1.9   < > = values in this interval not displayed.    Estimated Creatinine Clearance: 102.4 mL/min (A) (by C-G formula based on SCr of 1.01 mg/dL (H)).    No Known Allergies  Antimicrobials this admission: Cefepime 6/24 >>  Vancomycin 6/24 >>   Dose adjustments this admission: 6/25 Cefepime 2gm q12h > q8h 6/26 Vancomycin 1250mg  q12 > 1500mg  q12  Microbiology results: 6/24 BCx: ngtd 6/24 UCx: > 100K GNR  Thank you for allowing pharmacy to be a part of this patient's care.  PharmD 04/04/2020, 10:14 AM

## 2020-04-05 ENCOUNTER — Encounter (HOSPITAL_COMMUNITY): Payer: Self-pay | Admitting: Internal Medicine

## 2020-04-05 DIAGNOSIS — Z6841 Body Mass Index (BMI) 40.0 and over, adult: Secondary | ICD-10-CM

## 2020-04-05 DIAGNOSIS — K810 Acute cholecystitis: Secondary | ICD-10-CM

## 2020-04-05 DIAGNOSIS — K851 Biliary acute pancreatitis without necrosis or infection: Secondary | ICD-10-CM

## 2020-04-05 HISTORY — DX: Morbid (severe) obesity due to excess calories: E66.01

## 2020-04-05 LAB — GLUCOSE, CAPILLARY
Glucose-Capillary: 149 mg/dL — ABNORMAL HIGH (ref 70–99)
Glucose-Capillary: 151 mg/dL — ABNORMAL HIGH (ref 70–99)
Glucose-Capillary: 163 mg/dL — ABNORMAL HIGH (ref 70–99)
Glucose-Capillary: 177 mg/dL — ABNORMAL HIGH (ref 70–99)
Glucose-Capillary: 192 mg/dL — ABNORMAL HIGH (ref 70–99)
Glucose-Capillary: 193 mg/dL — ABNORMAL HIGH (ref 70–99)

## 2020-04-05 LAB — CBC WITH DIFFERENTIAL/PLATELET
Abs Immature Granulocytes: 0.32 10*3/uL — ABNORMAL HIGH (ref 0.00–0.07)
Basophils Absolute: 0.1 10*3/uL (ref 0.0–0.1)
Basophils Relative: 0 %
Eosinophils Absolute: 0 10*3/uL (ref 0.0–0.5)
Eosinophils Relative: 0 %
HCT: 46 % (ref 36.0–46.0)
Hemoglobin: 14.3 g/dL (ref 12.0–15.0)
Immature Granulocytes: 1 %
Lymphocytes Relative: 6 %
Lymphs Abs: 1.7 10*3/uL (ref 0.7–4.0)
MCH: 25.7 pg — ABNORMAL LOW (ref 26.0–34.0)
MCHC: 31.1 g/dL (ref 30.0–36.0)
MCV: 82.7 fL (ref 80.0–100.0)
Monocytes Absolute: 2.1 10*3/uL — ABNORMAL HIGH (ref 0.1–1.0)
Monocytes Relative: 7 %
Neutro Abs: 25.6 10*3/uL — ABNORMAL HIGH (ref 1.7–7.7)
Neutrophils Relative %: 86 %
Platelets: 347 10*3/uL (ref 150–400)
RBC: 5.56 MIL/uL — ABNORMAL HIGH (ref 3.87–5.11)
RDW: 16.4 % — ABNORMAL HIGH (ref 11.5–15.5)
WBC: 29.8 10*3/uL — ABNORMAL HIGH (ref 4.0–10.5)
nRBC: 0 % (ref 0.0–0.2)

## 2020-04-05 LAB — URINE CULTURE: Culture: >=100000 — AB

## 2020-04-05 LAB — COMPREHENSIVE METABOLIC PANEL
ALT: 35 U/L (ref 0–44)
AST: 18 U/L (ref 15–41)
Albumin: 2.2 g/dL — ABNORMAL LOW (ref 3.5–5.0)
Alkaline Phosphatase: 84 U/L (ref 38–126)
Anion gap: 12 (ref 5–15)
BUN: 18 mg/dL (ref 6–20)
CO2: 22 mmol/L (ref 22–32)
Calcium: 7.4 mg/dL — ABNORMAL LOW (ref 8.9–10.3)
Chloride: 105 mmol/L (ref 98–111)
Creatinine, Ser: 1.02 mg/dL — ABNORMAL HIGH (ref 0.44–1.00)
GFR calc Af Amer: >60 mL/min (ref >60)
GFR calc non Af Amer: >60 mL/min (ref >60)
Glucose, Bld: 163 mg/dL — ABNORMAL HIGH (ref 70–99)
Potassium: 4.3 mmol/L (ref 3.5–5.1)
Sodium: 139 mmol/L (ref 135–145)
Total Bilirubin: 1.8 mg/dL — ABNORMAL HIGH (ref 0.3–1.2)
Total Protein: 5.8 g/dL — ABNORMAL LOW (ref 6.5–8.1)

## 2020-04-05 LAB — PHOSPHORUS: Phosphorus: 1.8 mg/dL — ABNORMAL LOW (ref 2.5–4.6)

## 2020-04-05 LAB — MAGNESIUM: Magnesium: 2.2 mg/dL (ref 1.7–2.4)

## 2020-04-05 LAB — PROCALCITONIN: Procalcitonin: 0.96 ng/mL

## 2020-04-05 LAB — LIPASE, BLOOD: Lipase: 33 U/L (ref 11–51)

## 2020-04-05 MED ORDER — POTASSIUM PHOSPHATES 15 MMOLE/5ML IV SOLN
30.0000 mmol | Freq: Once | INTRAVENOUS | Status: AC
  Administered 2020-04-05: 30 mmol via INTRAVENOUS
  Filled 2020-04-05: qty 10

## 2020-04-05 MED ORDER — CALCIUM GLUCONATE-NACL 1-0.675 GM/50ML-% IV SOLN
1.0000 g | Freq: Once | INTRAVENOUS | Status: AC
  Administered 2020-04-05: 1000 mg via INTRAVENOUS
  Filled 2020-04-05: qty 50

## 2020-04-05 NOTE — Progress Notes (Signed)
PROGRESS NOTE  Amber May CHE:[id number] DOB: 04/17/1981 DOA: 04/02/2020 PCP: Patient, No Pcp Per  Brief History   39 year old woman PMH hypothyroidism, morbid obesity presented with abdominal pain and fever.  Admitted for severe acute pancreatitis, SIRS.  A & P  Acute pancreatitis with SIRS uncomplicated by CT and MRCP but with severe clinical features including fever, acute hypoxic respiratory failure, tachypnea, lactic acidosis, leukocytosis, acute kidney injury, ascites, elevated LFTs.  Presumed gallstone pancreatitis.  Triglycerides without significant elevation.  No alcohol use.  Elevated LFTs on admission suggest the possibility of gallstone pancreatitis. --Continues to have fever, low-grade temperatures.  However clinically appears to be improving, WBC is trending down, procalcitonin lower today.  LFTs trending down.  Lipase within normal limits.  Appears to be slowly improving. --Continue empiric antibiotics. --Appreciate gastroenterology.  Continue aggressive IV fluids, strict intake/outtake.  Daily labs.  Acute hypoxic respiratory failure, probably multifactorial, driving cardiology acute pancreatitis, cholecystitis, complicated by morbid obesity and probable obesity hypoventilation syndrome. --Appears stable on 6 L, wean as tolerated.  No evidence of acute pulmonary process.  Acute cholecystitis suspected. --We will continue empiric antibiotics.  Appreciate general surgery management.  Tentative plans for cholecystectomy next week.  Modest hypocalcemia, hypophosphatemia --Calcium now near normal.  Replete phosphorus.  Replete calcium.  Acute kidney injury secondary to acute pancreatitis --Probably at baseline at this point.  Slightly improved urine output.  Continue to follow closely.  Hyperglycemia secondary to acute pancreatitis.  Hemoglobin A1c 5.4. --Stable.  Continue sliding scale insulin.  Hepatomegaly and hepatic steatosis.  Likely related to morbid  obesity. --Follow-up as an outpatient with gastroenterology.  Abnormal low-density in the lower uterine segment/cervix spanning 18 mm, nonspecific. Recommend nonemergent pelvic ultrasound for characterization on an elective outpatient basis.  Morbid obesity. Body mass index is 59.19 kg/m. --Dietitian consult once stable.  Disposition Plan:  Discussion: Remains critically ill but making daily progress, still tachypneic, on 6 L nasal cannula, fever and leukocytosis.  Remain in ICU.  Continue management as per gastroenterology and general surgery.  Start clear liquids.  Discussed with critical care, agree with critical care signing off at this point.  Status is: Inpatient  Remains inpatient appropriate because:IV treatments appropriate due to intensity of illness or inability to take PO   Dispo: The patient is from: Home              Anticipated d/c is to: Home              Anticipated d/c date is: > 3 days              Patient currently is not medically stable to d/c.   DVT prophylaxis:  enoxaparin (LOVENOX) injection 40 mg Start: 04/04/20 1800 SCDs Start: 04/03/20 0534   Code Status: Full Family Communication: mother at bedside again 6/27  Murray Hodgkins, MD  [facility] Direct contact: see [email or web address] (further directions at bottom of note if needed) 7PM-7AM contact night coverage as at bottom of note 04/05/2020, 10:25 AM  LOS: 2 days   Significant Hospital Events   . 6/25 admitted for severe pancreatitis, fever   Consults:  . Gastroenterology . Critical care medicine . General surgery   Procedures:  .   Significant Diagnostic Tests:   6/25 CT abdomen pelvis acute edematous pancreatitis.  No acute peripancreatic collection.  Layering hyperdensity in the gallbladder may be represent sludge or stones.  Small to moderate volume ascites.  Hepatomegaly.  Hepatic steatosis.  6/25 MRCP Limited study.  Cholelithiasis,  apparent gallbladder wall thickening.  No definite  choledocholithiasis.  Mild edema pancreatic head.  6/25 right upper quadrant ultrasound.  Cholecystolithiasis, thickened gallbladder.  Consider HIDA.   Micro Data:  . 6/24 blood cultures . 6/24 urine culture   Antimicrobials:  . Cefepime > . Vancomycin >  Interval History/Subjective  No issues noted overnight. Patient feeling better today.  Pain controlled fairly well during the day.  Breathing better.  Still short of breath.    Objective   Vitals:  Vitals:   04/05/20 0700 04/05/20 0800  BP: (!) 150/22 (!) 150/20  Pulse: (!) 119 (!) 121  Resp: (!) 43 (!) 41  Temp: (!) 100.8 F (38.2 C) (!) 100.8 F (38.2 C)  SpO2: 93% 93%    Exam:  Constitutional.  Appears better today, less uncomfortable, less ill.  Appears calm. Psychiatric.  Grossly normal mood and affect.  Speech fluent and appropriate. Respiratory.  Clear to auscultation bilaterally.  No wheezes, rales or rhonchi.  Mild increased respiratory effort.  Tachypneic. Abdomen.  Obese.  Soft, nontender, nondistended. Cardiovascular.  Tachycardic, regular rhythm.   No murmur, rub or gallop.  1+ lower extremity edema.  I have personally reviewed the following:   Today's Data  . Creatinine essentially within normal limits.  BUN within normal limits.  Anion gap within normal limits.  Phosphorus low at 1.8.  Calcium improved, corrected value 8.4.  Magnesium within normal limits.  LFTs notable for decreasing total bilirubin.  Lipase within normal limits.  Procalcitonin has decreased.  Scheduled Meds: . Chlorhexidine Gluconate Cloth  6 each Topical Daily  . enoxaparin (LOVENOX) injection  40 mg Subcutaneous Q24H  . insulin aspart  0-6 Units Subcutaneous Q6H  . levothyroxine  75 mcg Intravenous Daily  . mouth rinse  15 mL Mouth Rinse BID   Continuous Infusions: . sodium chloride Stopped (04/03/20 1159)  . calcium gluconate    . ceFEPime (MAXIPIME) IV Stopped (04/05/20 0600)  . lactated ringers 200 mL/hr at 04/05/20 0800    . potassium PHOSPHATE IVPB (in mmol)    . vancomycin Stopped (04/05/20 0400)    Principal Problem:   Acute pancreatitis Active Problems:   SIRS (systemic inflammatory response syndrome) (HCC)   AKI (acute kidney injury) (HCC)   Hypothyroidism   Hyperglycemia   Elevated liver enzymes   Acute respiratory failure with hypoxemia (HCC)   Acute cholecystitis   Hypocalcemia   Hypophosphatemia   Morbid obesity with BMI of 50.0-59.9, adult (HCC)   LOS: 2 days   How to contact the [facility] Attending or Consulting provider 7A - 7P or covering provider during after hours 7P -7A, for this patient?  1. Check the care team in [facility] and look for a) attending/consulting [facility] provider listed and b) the [facility] team listed 2. Log into [email or web address] and use [facility]'s universal password to access. If you do not have the password, please contact the hospital operator. 3. Locate the [facility] provider you are looking for under [facility] and page to a number that you can be directly reached. 4. If you still have difficulty reaching the provider, please page the [facility] (Director on Call) for the Hospitalists listed on amion for assistance.

## 2020-04-05 NOTE — Progress Notes (Addendum)
Assessment & Plan: Biliary pancreatitis Cholecystitis, cholelithiasis  Tbili improving - now 1.8  Lipase normal at 33  WBC elevated 30K, but improving  Less tender on exam today, some back pain Morbid obesity  BMI 59  Discussed plans for lap chole when pancreatitis resolved.  Mother at bedside.  Hopefully can plan for surgery mid week.  OK to allow clear liquid diet at this point.  Will follow.  Will discuss with my bariatric surgery partners tomorrow.        Armandina Gemma, MD       [facility]       Office: [phone]   Chief Complaint: Acute pancreatitis, cholecystitis  Subjective: Patient in bed, stepdown unit.  Mother at bedside.  Some back pain.  Would like something to drink.  Objective: Vital signs in last 24 hours: Temp:  [100.6 F (38.1 C)-101.5 F (38.6 C)] 100.8 F (38.2 C) (06/27 0800) Pulse Rate:  [118-126] 121 (06/27 0800) Resp:  [25-45] 41 (06/27 0800) BP: (127-160)/(20-106) 150/20 (06/27 0800) SpO2:  [90 %-97 %] 93 % (06/27 0800) Weight:  [146.8 kg] 146.8 kg (06/27 0500) Last BM Date:  (PTA per patient)  Intake/Output from previous day: 06/26 0701 - 06/27 0700 In: 4842.5 [I.V.:3182.6; IV Piggyback:1660] Out: 1050 [Urine:1050] Intake/Output this shift: Total I/O In: 199.9 [I.V.:199.9] Out: -   Physical Exam: HEENT - sclerae clear, mucous membranes moist Neck - soft Chest - clear bilaterally Cor - RRR Abdomen - soft, obese; mild tenderness to palpation epigastrium and RUQ; no mass palpable Neuro - alert & oriented, no focal deficits  Lab Results:  Recent Labs    04/04/20 1229 04/05/20 0405  WBC 33.3* 29.8*  HGB 14.7 14.3  HCT 47.4* 46.0  PLT 336 347   BMET Recent Labs    04/04/20 0318 04/05/20 0658  NA 133* 139  K 4.0 4.3  CL 103 105  CO2 22 22  GLUCOSE 149* 163*  BUN 16 18  CREATININE 1.01* 1.02*  CALCIUM 6.7* 7.4*   PT/INR No results for input(s): LABPROT, INR in the last 72  hours. Comprehensive Metabolic Panel:    Component Value Date/Time   NA 139 04/05/2020 0658   NA 133 (L) 04/04/2020 0318   K 4.3 04/05/2020 0658   K 4.0 04/04/2020 0318   CL 105 04/05/2020 0658   CL 103 04/04/2020 0318   CO2 22 04/05/2020 0658   CO2 22 04/04/2020 0318   BUN 18 04/05/2020 0658   BUN 16 04/04/2020 0318   CREATININE 1.02 (H) 04/05/2020 0658   CREATININE 1.01 (H) 04/04/2020 0318   GLUCOSE 163 (H) 04/05/2020 0658   GLUCOSE 149 (H) 04/04/2020 0318   CALCIUM 7.4 (L) 04/05/2020 0658   CALCIUM 6.7 (L) 04/04/2020 0318   AST 18 04/05/2020 0658   AST 20 04/04/2020 0318   ALT 35 04/05/2020 0658   ALT 59 (H) 04/04/2020 0318   ALKPHOS 84 04/05/2020 0658   ALKPHOS 89 04/04/2020 0318   BILITOT 1.8 (H) 04/05/2020 0658   BILITOT 2.0 (H) 04/04/2020 0318   PROT 5.8 (L) 04/05/2020 0658   PROT 5.6 (L) 04/04/2020 0318   ALBUMIN 2.2 (L) 04/05/2020 0658   ALBUMIN 2.3 (L) 04/04/2020 0318    Studies/Results: MR ABDOMEN MRCP WO CONTRAST  Result Date: 04/03/2020 CLINICAL DATA:  Cholelithiasis. Known pancreatitis. Patient had difficulty breathing and was unable to complete the last series of the exam. EXAM: MRI ABDOMEN WITHOUT CONTRAST  (INCLUDING MRCP) TECHNIQUE: Multiplanar multisequence  MR imaging of the abdomen was performed. Heavily T2-weighted images of the biliary and pancreatic ducts were obtained, and three-dimensional MRCP images were rendered by post processing. COMPARISON:  CT scan 04/02/2020 FINDINGS: Lower chest: Bilateral pleural effusions associated with bibasilar collapse/consolidative changes. Hepatobiliary: Liver measures 20.8 cm craniocaudal length, enlarged. Loss of signal in the liver parenchyma on out of phase T1 imaging suggests fatty deposition. No focal abnormality in the liver parenchyma on this noncontrast motion degraded study. Numerous tiny gallstones evident with apparent gallbladder wall thickening. No intrahepatic biliary duct dilatation. No extrahepatic  biliary duct dilatation. Study is markedly limited, but no definite choledocholithiasis. Axial T2 haste imaging demonstrates some central flow artifact in the common duct, but no definite ductal stones. Pancreas: No main duct dilatation. No gross mass lesion in the pancreas. Mild edema seen in the region of the pancreatic head. Spleen:  No splenomegaly. No focal mass lesion. Adrenals/Urinary Tract: No adrenal nodule or mass. No hydronephrosis. No gross renal mass on this noncontrast study. Probable 11 mm cyst interpolar left kidney. Stomach/Bowel: Stomach is unremarkable. No gastric wall thickening. No evidence of outlet obstruction. Duodenum is normally positioned as is the ligament of Treitz. No small bowel or colonic dilatation within the visualized abdomen. Vascular/Lymphatic: No abdominal aortic aneurysm. There is no gastrohepatic or hepatoduodenal ligament lymphadenopathy. No retroperitoneal or mesenteric lymphadenopathy. Other:  Moderate volume intraperitoneal free fluid. Musculoskeletal: No suspicious marrow signal abnormality within the visualized bony anatomy. IMPRESSION: 1. Limited study due to motion artifact and lack of intravenous contrast. Patient was unable to complete the entire exam. 2. Cholelithiasis with apparent gallbladder wall thickening. No intrahepatic or extrahepatic biliary duct dilatation. No definite choledocholithiasis. If there is clinical concern for acute cholecystitis, abdominal ultrasound or nuclear scintigraphy may prove helpful to further evaluate. 3. Mild edema in the region of the pancreatic head. No main duct dilatation. Imaging features may be related to pancreatitis. 4. Hepatomegaly with steatosis. 5. Moderate volume intraperitoneal free fluid. 6. Bilateral pleural effusions associated with bibasilar collapse/consolidative changes. Electronically Signed   By: Kennith Center M.D.   On: 04/03/2020 12:02   MR 3D Recon At Scanner  Result Date: 04/03/2020 CLINICAL DATA:   Cholelithiasis. Known pancreatitis. Patient had difficulty breathing and was unable to complete the last series of the exam. EXAM: MRI ABDOMEN WITHOUT CONTRAST  (INCLUDING MRCP) TECHNIQUE: Multiplanar multisequence MR imaging of the abdomen was performed. Heavily T2-weighted images of the biliary and pancreatic ducts were obtained, and three-dimensional MRCP images were rendered by post processing. COMPARISON:  CT scan 04/02/2020 FINDINGS: Lower chest: Bilateral pleural effusions associated with bibasilar collapse/consolidative changes. Hepatobiliary: Liver measures 20.8 cm craniocaudal length, enlarged. Loss of signal in the liver parenchyma on out of phase T1 imaging suggests fatty deposition. No focal abnormality in the liver parenchyma on this noncontrast motion degraded study. Numerous tiny gallstones evident with apparent gallbladder wall thickening. No intrahepatic biliary duct dilatation. No extrahepatic biliary duct dilatation. Study is markedly limited, but no definite choledocholithiasis. Axial T2 haste imaging demonstrates some central flow artifact in the common duct, but no definite ductal stones. Pancreas: No main duct dilatation. No gross mass lesion in the pancreas. Mild edema seen in the region of the pancreatic head. Spleen:  No splenomegaly. No focal mass lesion. Adrenals/Urinary Tract: No adrenal nodule or mass. No hydronephrosis. No gross renal mass on this noncontrast study. Probable 11 mm cyst interpolar left kidney. Stomach/Bowel: Stomach is unremarkable. No gastric wall thickening. No evidence of outlet obstruction. Duodenum is normally positioned  as is the ligament of Treitz. No small bowel or colonic dilatation within the visualized abdomen. Vascular/Lymphatic: No abdominal aortic aneurysm. There is no gastrohepatic or hepatoduodenal ligament lymphadenopathy. No retroperitoneal or mesenteric lymphadenopathy. Other:  Moderate volume intraperitoneal free fluid. Musculoskeletal: No  suspicious marrow signal abnormality within the visualized bony anatomy. IMPRESSION: 1. Limited study due to motion artifact and lack of intravenous contrast. Patient was unable to complete the entire exam. 2. Cholelithiasis with apparent gallbladder wall thickening. No intrahepatic or extrahepatic biliary duct dilatation. No definite choledocholithiasis. If there is clinical concern for acute cholecystitis, abdominal ultrasound or nuclear scintigraphy may prove helpful to further evaluate. 3. Mild edema in the region of the pancreatic head. No main duct dilatation. Imaging features may be related to pancreatitis. 4. Hepatomegaly with steatosis. 5. Moderate volume intraperitoneal free fluid. 6. Bilateral pleural effusions associated with bibasilar collapse/consolidative changes. Electronically Signed   By: Kennith Center M.D.   On: 04/03/2020 12:02   DG CHEST PORT 1 VIEW  Result Date: 04/04/2020 CLINICAL DATA:  Acute respiratory failure EXAM: PORTABLE CHEST 1 VIEW COMPARISON:  April 03, 2020 FINDINGS: Bilateral pleural effusions with underlying opacities. No other changes. IMPRESSION: Stable bilateral effusions with underlying opacities, favored represent atelectasis. No other change. Recommend follow-up to resolution. Electronically Signed   By: Gerome Sam III M.D   On: 04/04/2020 08:37   DG CHEST PORT 1 VIEW  Result Date: 04/03/2020 CLINICAL DATA:  Hypoxia, shortness of breath EXAM: PORTABLE CHEST 1 VIEW COMPARISON:  04/02/2020 FINDINGS: Examination is significantly limited secondary to poor penetration from patient body habitus, semiupright portable technique, and poor inspiratory effort. Very low lung volumes with accentuation of the heart size. Hazy and streaky bibasilar opacities suggesting small bilateral pleural effusions with bibasilar atelectasis. No pneumothorax. IMPRESSION: Limited examination. Hazy and streaky bibasilar opacities suggesting small bilateral pleural effusions with bibasilar  atelectasis. Electronically Signed   By: Duanne Guess D.O.   On: 04/03/2020 13:59   US Abdomen Limited RUQ  Result Date: 04/03/2020 CLINICAL DATA:  Pancreatitis. EXAM: ULTRASOUND ABDOMEN LIMITED RIGHT UPPER QUADRANT COMPARISON:  MRCP 04/03/2020 FINDINGS: The examination is limited due to patient body habitus. Gallbladder: Cholecystolithiasis. Thickened appearance of the gallbladder wall measuring 5 mm. No sonographic Eulah Pont sign is elicited by the scanning technologist. Common bile duct: Diameter: The common duct is dilated measuring 7 mm in diameter. Liver: Generalized increased hepatic parenchymal echogenicity. No definite focal liver lesion is identified. Portal vein is patent on color Doppler imaging with normal direction of blood flow towards the liver. Other: Moderate ascites within the visualized abdomen. IMPRESSION: Limited examination as described. There is cholecystolithiasis. Additionally, there is a thickened appearance of the gallbladder wall measuring 5 mm. Acute cholecystitis cannot be excluded, although gallbladder wall thickening is a nonspecific finding in the setting of abdominal ascites, and a nuclear medicine HIDA scan may be helpful for further assessment. Mild extrahepatic biliary ductal dilatation with the common duct measuring 7 mm in diameter. Hyperechogenicity of the hepatic parenchyma. Findings are consistent with the hepatic steatosis demonstrated on same day MRCP. Moderate ascites within the visualized abdomen. Electronically Signed   By: Jackey Loge DO   On: 04/03/2020 17:05      Darnell Level 04/05/2020  Patient ID: Amber May, female   DOB: [birth year], 39 y.o.   MRN: [id number]

## 2020-04-05 NOTE — Progress Notes (Signed)
Progress Note for [facility]  Subjective: Sweaty, but feeling well.  Objective: Vital signs in last 24 hours: Temp:  [100.6 F (38.1 C)-101.5 F (38.6 C)] 100.8 F (38.2 C) (06/27 0800) Pulse Rate:  [118-126] 121 (06/27 0800) Resp:  [25-45] 41 (06/27 0800) BP: (127-160)/(20-106) 150/20 (06/27 0800) SpO2:  [90 %-97 %] 93 % (06/27 0800) Weight:  [146.8 kg] 146.8 kg (06/27 0500) Last BM Date:  (PTA per patient)  Intake/Output from previous day: 06/26 0701 - 06/27 0700 In: 4842.5 [I.V.:3182.6; IV Piggyback:1660] Out: 1050 [Urine:1050] Intake/Output this shift: Total I/O In: 199.9 [I.V.:199.9] Out: -   General appearance: alert, no distress and tachypnic Resp: clear to auscultation bilaterally Cardio: regular rate and rhythm GI: soft, non-tender; bowel sounds normal; no masses,  no organomegaly and morbidly obese Extremities: extremities normal, atraumatic, no cyanosis or edema  Lab Results: Recent Labs    04/04/20 0318 04/04/20 1229 04/05/20 0405  WBC 34.6* 33.3* 29.8*  HGB 15.0 14.7 14.3  HCT 48.1* 47.4* 46.0  PLT 305 336 347   BMET Recent Labs    04/03/20 0709 04/04/20 0318 04/05/20 0658  NA 135 133* 139  K 4.1 4.0 4.3  CL 106 103 105  CO2 18* 22 22  GLUCOSE 172* 149* 163*  BUN 21* 16 18  CREATININE 1.22* 1.01* 1.02*  CALCIUM 6.4* 6.7* 7.4*   LFT Recent Labs    04/03/20 0709 04/04/20 0318 04/05/20 0658  PROT 5.8*   < > 5.8*  ALBUMIN 2.8*   < > 2.2*  AST 33   < > 18  ALT 101*   < > 35  ALKPHOS 96   < > 84  BILITOT 2.4*   < > 1.8*  BILIDIR 1.3*  --   --   IBILI 1.1*  --   --    < > = values in this interval not displayed.   PT/INR No results for input(s): LABPROT, INR in the last 72 hours. Hepatitis Panel No results for input(s): HEPBSAG, HCVAB, HEPAIGM, HEPBIGM in the last 72 hours. C-Diff No results for input(s): CDIFFTOX in the last 72 hours. Fecal Lactopherrin No results for input(s): FECLLACTOFRN in the last 72  hours.  Studies/Results: MR ABDOMEN MRCP WO CONTRAST  Result Date: 04/03/2020 CLINICAL DATA:  Cholelithiasis. Known pancreatitis. Patient had difficulty breathing and was unable to complete the last series of the exam. EXAM: MRI ABDOMEN WITHOUT CONTRAST  (INCLUDING MRCP) TECHNIQUE: Multiplanar multisequence MR imaging of the abdomen was performed. Heavily T2-weighted images of the biliary and pancreatic ducts were obtained, and three-dimensional MRCP images were rendered by post processing. COMPARISON:  CT scan 04/02/2020 FINDINGS: Lower chest: Bilateral pleural effusions associated with bibasilar collapse/consolidative changes. Hepatobiliary: Liver measures 20.8 cm craniocaudal length, enlarged. Loss of signal in the liver parenchyma on out of phase T1 imaging suggests fatty deposition. No focal abnormality in the liver parenchyma on this noncontrast motion degraded study. Numerous tiny gallstones evident with apparent gallbladder wall thickening. No intrahepatic biliary duct dilatation. No extrahepatic biliary duct dilatation. Study is markedly limited, but no definite choledocholithiasis. Axial T2 haste imaging demonstrates some central flow artifact in the common duct, but no definite ductal stones. Pancreas: No main duct dilatation. No gross mass lesion in the pancreas. Mild edema seen in the region of the pancreatic head. Spleen:  No splenomegaly. No focal mass lesion. Adrenals/Urinary Tract: No adrenal nodule or mass. No hydronephrosis. No gross renal mass on this noncontrast study. Probable 11 mm cyst interpolar left kidney. Stomach/Bowel:  Stomach is unremarkable. No gastric wall thickening. No evidence of outlet obstruction. Duodenum is normally positioned as is the ligament of Treitz. No small bowel or colonic dilatation within the visualized abdomen. Vascular/Lymphatic: No abdominal aortic aneurysm. There is no gastrohepatic or hepatoduodenal ligament lymphadenopathy. No retroperitoneal or mesenteric  lymphadenopathy. Other:  Moderate volume intraperitoneal free fluid. Musculoskeletal: No suspicious marrow signal abnormality within the visualized bony anatomy. IMPRESSION: 1. Limited study due to motion artifact and lack of intravenous contrast. Patient was unable to complete the entire exam. 2. Cholelithiasis with apparent gallbladder wall thickening. No intrahepatic or extrahepatic biliary duct dilatation. No definite choledocholithiasis. If there is clinical concern for acute cholecystitis, abdominal ultrasound or nuclear scintigraphy may prove helpful to further evaluate. 3. Mild edema in the region of the pancreatic head. No main duct dilatation. Imaging features may be related to pancreatitis. 4. Hepatomegaly with steatosis. 5. Moderate volume intraperitoneal free fluid. 6. Bilateral pleural effusions associated with bibasilar collapse/consolidative changes. Electronically Signed   By: Kennith Center M.D.   On: 04/03/2020 12:02   MR 3D Recon At Scanner  Result Date: 04/03/2020 CLINICAL DATA:  Cholelithiasis. Known pancreatitis. Patient had difficulty breathing and was unable to complete the last series of the exam. EXAM: MRI ABDOMEN WITHOUT CONTRAST  (INCLUDING MRCP) TECHNIQUE: Multiplanar multisequence MR imaging of the abdomen was performed. Heavily T2-weighted images of the biliary and pancreatic ducts were obtained, and three-dimensional MRCP images were rendered by post processing. COMPARISON:  CT scan 04/02/2020 FINDINGS: Lower chest: Bilateral pleural effusions associated with bibasilar collapse/consolidative changes. Hepatobiliary: Liver measures 20.8 cm craniocaudal length, enlarged. Loss of signal in the liver parenchyma on out of phase T1 imaging suggests fatty deposition. No focal abnormality in the liver parenchyma on this noncontrast motion degraded study. Numerous tiny gallstones evident with apparent gallbladder wall thickening. No intrahepatic biliary duct dilatation. No extrahepatic  biliary duct dilatation. Study is markedly limited, but no definite choledocholithiasis. Axial T2 haste imaging demonstrates some central flow artifact in the common duct, but no definite ductal stones. Pancreas: No main duct dilatation. No gross mass lesion in the pancreas. Mild edema seen in the region of the pancreatic head. Spleen:  No splenomegaly. No focal mass lesion. Adrenals/Urinary Tract: No adrenal nodule or mass. No hydronephrosis. No gross renal mass on this noncontrast study. Probable 11 mm cyst interpolar left kidney. Stomach/Bowel: Stomach is unremarkable. No gastric wall thickening. No evidence of outlet obstruction. Duodenum is normally positioned as is the ligament of Treitz. No small bowel or colonic dilatation within the visualized abdomen. Vascular/Lymphatic: No abdominal aortic aneurysm. There is no gastrohepatic or hepatoduodenal ligament lymphadenopathy. No retroperitoneal or mesenteric lymphadenopathy. Other:  Moderate volume intraperitoneal free fluid. Musculoskeletal: No suspicious marrow signal abnormality within the visualized bony anatomy. IMPRESSION: 1. Limited study due to motion artifact and lack of intravenous contrast. Patient was unable to complete the entire exam. 2. Cholelithiasis with apparent gallbladder wall thickening. No intrahepatic or extrahepatic biliary duct dilatation. No definite choledocholithiasis. If there is clinical concern for acute cholecystitis, abdominal ultrasound or nuclear scintigraphy may prove helpful to further evaluate. 3. Mild edema in the region of the pancreatic head. No main duct dilatation. Imaging features may be related to pancreatitis. 4. Hepatomegaly with steatosis. 5. Moderate volume intraperitoneal free fluid. 6. Bilateral pleural effusions associated with bibasilar collapse/consolidative changes. Electronically Signed   By: Kennith Center M.D.   On: 04/03/2020 12:02   DG CHEST PORT 1 VIEW  Result Date: 04/04/2020 CLINICAL DATA:  Acute  respiratory  failure EXAM: PORTABLE CHEST 1 VIEW COMPARISON:  April 03, 2020 FINDINGS: Bilateral pleural effusions with underlying opacities. No other changes. IMPRESSION: Stable bilateral effusions with underlying opacities, favored represent atelectasis. No other change. Recommend follow-up to resolution. Electronically Signed   By: Dorise Bullion III M.D   On: 04/04/2020 08:37   DG CHEST PORT 1 VIEW  Result Date: 04/03/2020 CLINICAL DATA:  Hypoxia, shortness of breath EXAM: PORTABLE CHEST 1 VIEW COMPARISON:  04/02/2020 FINDINGS: Examination is significantly limited secondary to poor penetration from patient body habitus, semiupright portable technique, and poor inspiratory effort. Very low lung volumes with accentuation of the heart size. Hazy and streaky bibasilar opacities suggesting small bilateral pleural effusions with bibasilar atelectasis. No pneumothorax. IMPRESSION: Limited examination. Hazy and streaky bibasilar opacities suggesting small bilateral pleural effusions with bibasilar atelectasis. Electronically Signed   By: Davina Poke D.O.   On: 04/03/2020 13:59   US Abdomen Limited RUQ  Result Date: 04/03/2020 CLINICAL DATA:  Pancreatitis. EXAM: ULTRASOUND ABDOMEN LIMITED RIGHT UPPER QUADRANT COMPARISON:  MRCP 04/03/2020 FINDINGS: The examination is limited due to patient body habitus. Gallbladder: Cholecystolithiasis. Thickened appearance of the gallbladder wall measuring 5 mm. No sonographic Percell Miller sign is elicited by the scanning technologist. Common bile duct: Diameter: The common duct is dilated measuring 7 mm in diameter. Liver: Generalized increased hepatic parenchymal echogenicity. No definite focal liver lesion is identified. Portal vein is patent on color Doppler imaging with normal direction of blood flow towards the liver. Other: Moderate ascites within the visualized abdomen. IMPRESSION: Limited examination as described. There is cholecystolithiasis. Additionally, there is a  thickened appearance of the gallbladder wall measuring 5 mm. Acute cholecystitis cannot be excluded, although gallbladder wall thickening is a nonspecific finding in the setting of abdominal ascites, and a nuclear medicine HIDA scan may be helpful for further assessment. Mild extrahepatic biliary ductal dilatation with the common duct measuring 7 mm in diameter. Hyperechogenicity of the hepatic parenchyma. Findings are consistent with the hepatic steatosis demonstrated on same day MRCP. Moderate ascites within the visualized abdomen. Electronically Signed   By: Kellie Simmering DO   On: 04/03/2020 17:05    Medications:  Scheduled: . Chlorhexidine Gluconate Cloth  6 each Topical Daily  . enoxaparin (LOVENOX) injection  40 mg Subcutaneous Q24H  . insulin aspart  0-6 Units Subcutaneous Q6H  . levothyroxine  75 mcg Intravenous Daily  . mouth rinse  15 mL Mouth Rinse BID   Continuous: . sodium chloride Stopped (04/03/20 1159)  . ceFEPime (MAXIPIME) IV Stopped (04/05/20 0600)  . lactated ringers 200 mL/hr at 04/05/20 0800  . potassium PHOSPHATE IVPB (in mmol)    . vancomycin Stopped (04/05/20 0400)    Assessment/Plan: 1) Acute pancreatitis, presumed to be from a biliary source. 2) Cholelithiasis.   The patient appears well clinically.  She states that her pain is being adequately controlled, however, she remains tachypnic and tachycardic.  Her BUN does show a mild improvement and her HCT dropped from 50 on admission down to 46.  She is trending in the right direction, albeit slowly.  The CXR yesterday did not show any pulmonary edema, but she still requires a high amount of oxygen, 6 L, to maintain saturations in the low 90's.  Her urine output is at 0.3 ml/kg/hour and she is positive 3.4 liter yesterday.  The LR rate is at 200 ml/hour.  Plan: 1) Continue with aggressive fluid hydration. 2) Continue with pain control. 3) Continue with antibiotics.  LOS: 2 days  Denya Buckingham D 04/05/2020, 8:24  AM

## 2020-04-05 NOTE — Progress Notes (Signed)
   D/w Dr Irene Limbo of [facility] -> ccm can sign off    SIGNATURE    Dr. Kalman Shan, M.D., F.C.C.P,  Pulmonary and Critical Care Medicine Staff Physician, [facility] Director - Interstitial Lung Disease  Program  Pulmonary Fibrosis [facility] at [facility] [place], [place], [address]  Pager: [phone], If no answer or between  15:00h - 7:00h: call [phone] Telephone: [phone]  10:22 AM 04/05/2020

## 2020-04-06 ENCOUNTER — Inpatient Hospital Stay (HOSPITAL_COMMUNITY): Payer: Self-pay

## 2020-04-06 DIAGNOSIS — K8500 Idiopathic acute pancreatitis without necrosis or infection: Secondary | ICD-10-CM

## 2020-04-06 DIAGNOSIS — N179 Acute kidney failure, unspecified: Secondary | ICD-10-CM

## 2020-04-06 DIAGNOSIS — K8020 Calculus of gallbladder without cholecystitis without obstruction: Secondary | ICD-10-CM

## 2020-04-06 DIAGNOSIS — J9601 Acute respiratory failure with hypoxia: Secondary | ICD-10-CM

## 2020-04-06 DIAGNOSIS — K810 Acute cholecystitis: Secondary | ICD-10-CM

## 2020-04-06 DIAGNOSIS — K8001 Calculus of gallbladder with acute cholecystitis with obstruction: Secondary | ICD-10-CM

## 2020-04-06 DIAGNOSIS — R1013 Epigastric pain: Secondary | ICD-10-CM

## 2020-04-06 LAB — COMPREHENSIVE METABOLIC PANEL
ALT: 29 U/L (ref 0–44)
AST: 17 U/L (ref 15–41)
Albumin: 2.4 g/dL — ABNORMAL LOW (ref 3.5–5.0)
Alkaline Phosphatase: 83 U/L (ref 38–126)
Anion gap: 13 (ref 5–15)
BUN: 20 mg/dL (ref 6–20)
CO2: 19 mmol/L — ABNORMAL LOW (ref 22–32)
Calcium: 7.9 mg/dL — ABNORMAL LOW (ref 8.9–10.3)
Chloride: 106 mmol/L (ref 98–111)
Creatinine, Ser: 0.79 mg/dL (ref 0.44–1.00)
GFR calc Af Amer: >60 mL/min (ref >60)
GFR calc non Af Amer: >60 mL/min (ref >60)
Glucose, Bld: 137 mg/dL — ABNORMAL HIGH (ref 70–99)
Potassium: 4.4 mmol/L (ref 3.5–5.1)
Sodium: 138 mmol/L (ref 135–145)
Total Bilirubin: 1.2 mg/dL (ref 0.3–1.2)
Total Protein: 6.2 g/dL — ABNORMAL LOW (ref 6.5–8.1)

## 2020-04-06 LAB — CBC WITH DIFFERENTIAL/PLATELET
Abs Immature Granulocytes: 0.78 10*3/uL — ABNORMAL HIGH (ref 0.00–0.07)
Basophils Absolute: 0.1 10*3/uL (ref 0.0–0.1)
Basophils Relative: 1 %
Eosinophils Absolute: 0 10*3/uL (ref 0.0–0.5)
Eosinophils Relative: 0 %
HCT: 44.3 % (ref 36.0–46.0)
Hemoglobin: 13.3 g/dL (ref 12.0–15.0)
Immature Granulocytes: 3 %
Lymphocytes Relative: 8 %
Lymphs Abs: 2.4 10*3/uL (ref 0.7–4.0)
MCH: 26.1 pg (ref 26.0–34.0)
MCHC: 30 g/dL (ref 30.0–36.0)
MCV: 86.9 fL (ref 80.0–100.0)
Monocytes Absolute: 2.7 10*3/uL — ABNORMAL HIGH (ref 0.1–1.0)
Monocytes Relative: 10 %
Neutro Abs: 22.2 10*3/uL — ABNORMAL HIGH (ref 1.7–7.7)
Neutrophils Relative %: 78 %
Platelets: 402 10*3/uL — ABNORMAL HIGH (ref 150–400)
RBC: 5.1 MIL/uL (ref 3.87–5.11)
RDW: 16 % — ABNORMAL HIGH (ref 11.5–15.5)
WBC: 28.2 10*3/uL — ABNORMAL HIGH (ref 4.0–10.5)
nRBC: 0 % (ref 0.0–0.2)

## 2020-04-06 LAB — GLUCOSE, CAPILLARY
Glucose-Capillary: 150 mg/dL — ABNORMAL HIGH (ref 70–99)
Glucose-Capillary: 125 mg/dL — ABNORMAL HIGH (ref 70–99)
Glucose-Capillary: 173 mg/dL — ABNORMAL HIGH (ref 70–99)
Glucose-Capillary: 215 mg/dL — ABNORMAL HIGH (ref 70–99)

## 2020-04-06 LAB — PHOSPHORUS: Phosphorus: 2 mg/dL — ABNORMAL LOW (ref 2.5–4.6)

## 2020-04-06 LAB — MAGNESIUM: Magnesium: 2.4 mg/dL (ref 1.7–2.4)

## 2020-04-06 MED ORDER — MORPHINE BOLUS VIA INFUSION: 3.0000 mg | Freq: Once | INTRAVENOUS | Status: DC

## 2020-04-06 MED ORDER — POTASSIUM PHOSPHATES 15 MMOLE/5ML IV SOLN
20.0000 mmol | Freq: Once | INTRAVENOUS | Status: AC
  Administered 2020-04-06: 20 mmol via INTRAVENOUS
  Filled 2020-04-06: qty 6.67

## 2020-04-06 MED ORDER — TECHNETIUM TC 99M MEBROFENIN IV KIT: 5.3700 | PACK | Freq: Once | INTRAVENOUS | Status: DC | PRN

## 2020-04-06 MED ORDER — LEVALBUTEROL HCL 0.63 MG/3ML IN NEBU
0.6300 mg | INHALATION_SOLUTION | Freq: Four times a day (QID) | RESPIRATORY_TRACT | Status: DC | PRN
  Administered 2020-04-06 – 2020-04-07 (×2): 0.63 mg via RESPIRATORY_TRACT
  Filled 2020-04-06 (×2): qty 3

## 2020-04-06 MED ORDER — ENOXAPARIN SODIUM 80 MG/0.8ML ~~LOC~~ SOLN
0.5000 mg/kg | SUBCUTANEOUS | Status: DC
  Administered 2020-04-06: 75 mg via SUBCUTANEOUS
  Filled 2020-04-06: qty 0.75

## 2020-04-06 MED ORDER — MORPHINE SULFATE (PF) 4 MG/ML IV SOLN
3.0000 mg | Freq: Once | INTRAVENOUS | Status: AC
  Administered 2020-04-06: 3 mg via INTRAVENOUS

## 2020-04-06 MED ORDER — MORPHINE SULFATE (PF) 2 MG/ML IV SOLN
INTRAVENOUS | Status: AC
  Filled 2020-04-06: qty 2

## 2020-04-06 NOTE — Progress Notes (Signed)
[facility] Progress Note     Subjective: Patient seen sitting in chair after HIDA today, mother at bedside. She denies abdominal pain or nausea. Reports central back pain when morphine wears off between doses. She is tolerating CLD and passing flatus, no BM in a few days.   Objective: Vital signs in last 24 hours: Temp:  [97.7 F (36.5 C)-101.7 F (38.7 C)] 97.7 F (36.5 C) (06/28 0800) Pulse Rate:  [111-133] 115 (06/28 1135) Resp:  [22-53] 32 (06/28 1135) BP: (100-169)/(29-115) 169/45 (06/28 1135) SpO2:  [86 %-100 %] 97 % (06/28 1135) Weight:  [150 kg] 150 kg (06/28 0500) Last BM Date:  (PTA per patient)  Intake/Output from previous day: 06/27 0701 - 06/28 0700 In: 5654 [P.O.:2760; I.V.:1798.1; IV Piggyback:1096] Out: 1125 [Urine:1125] Intake/Output this shift: Total I/O In: 489.5 [P.O.:240; I.V.:249.5] Out: 100 [Urine:100]  PE: General: pleasant, WD, morbidly obese white female who is sitting in chair, NAD HEENT: Sclera are anicteric.  PERRL.  Ears and nose without any masses or lesions.  Mouth is pink and moist Heart: regular, rate, and rhythm.  Normal s1,s2. No obvious murmurs, gallops, or rubs noted.  Palpable radial and pedal pulses bilaterally Lungs: on supplemental oxygen, tachypnea, no wheezing  Abd: soft, NT, some fullness with mild guarding in epigastric abdomen, no peritonitis    Lab Results:  Recent Labs    04/05/20 0405 04/06/20 0340  WBC 29.8* 28.2*  HGB 14.3 13.3  HCT 46.0 44.3  PLT 347 402*   BMET Recent Labs    04/05/20 0658 04/06/20 0340  NA 139 138  K 4.3 4.4  CL 105 106  CO2 22 19*  GLUCOSE 163* 137*  BUN 18 20  CREATININE 1.02* 0.79  CALCIUM 7.4* 7.9*   PT/INR No results for input(s): LABPROT, INR in the last 72 hours. CMP     Component Value Date/Time   NA 138 04/06/2020 0340   K 4.4 04/06/2020 0340   CL 106 04/06/2020 0340   CO2 19 (L) 04/06/2020 0340   GLUCOSE 137 (H) 04/06/2020 0340   BUN 20 04/06/2020  0340   CREATININE 0.79 04/06/2020 0340   CALCIUM 7.9 (L) 04/06/2020 0340   PROT 6.2 (L) 04/06/2020 0340   ALBUMIN 2.4 (L) 04/06/2020 0340   AST 17 04/06/2020 0340   ALT 29 04/06/2020 0340   ALKPHOS 83 04/06/2020 0340   BILITOT 1.2 04/06/2020 0340   GFRNONAA >60 04/06/2020 0340   GFRAA >60 04/06/2020 0340   Lipase     Component Value Date/Time   LIPASE 33 04/05/2020 0658       Studies/Results: NM Hepatobiliary Liver Func  Result Date: 04/06/2020 CLINICAL DATA:  Abdominal pain. EXAM: NUCLEAR MEDICINE HEPATOBILIARY IMAGING TECHNIQUE: Sequential images of the abdomen were obtained out to 60 minutes following intravenous administration of radiopharmaceutical. RADIOPHARMACEUTICALS:  5.37 mCi Tc-24m  Choletec IV COMPARISON:  April 03, 2020.  April 02, 2020. FINDINGS: Prompt uptake and biliary excretion of activity by the liver is seen. However, there is no definite evidence of uptake within the gallbladder, even after the administration of 3 mg of morphine intravenously. Biliary activity passes into small bowel, consistent with patent common bile duct. IMPRESSION: No definite evidence of uptake is noted within the gallbladder lumen, even after the administration of morphine intravenously. This is concerning for possible cystic duct obstruction and acute cholecystitis. Electronically Signed   By: Marijo Conception M.D.   On: 04/06/2020 12:12    Anti-infectives: Anti-infectives (From admission, onward)  Start     Dose/Rate Route Frequency Ordered Stop   04/04/20 1300  vancomycin (VANCOREADY) IVPB 1500 mg/300 mL  Status:  Discontinued        1,500 mg 150 mL/hr over 120 Minutes Intravenous Every 12 hours 04/04/20 1014 04/06/20 1157   04/03/20 2000  vancomycin (VANCOREADY) IVPB 1750 mg/350 mL  Status:  Discontinued        1,750 mg 175 mL/hr over 120 Minutes Intravenous Every 24 hours 04/02/20 1937 04/03/20 1157   04/03/20 1400  ceFEPIme (MAXIPIME) 2 g in sodium chloride 0.9 % 100 mL IVPB      Discontinue     2 g 200 mL/hr over 30 Minutes Intravenous Every 8 hours 04/03/20 1157     04/03/20 1300  vancomycin (VANCOREADY) IVPB 1250 mg/250 mL  Status:  Discontinued        1,250 mg 166.7 mL/hr over 90 Minutes Intravenous Every 12 hours 04/03/20 1157 04/04/20 1014   04/03/20 0600  ceFEPIme (MAXIPIME) 2 g in sodium chloride 0.9 % 100 mL IVPB  Status:  Discontinued        2 g 200 mL/hr over 30 Minutes Intravenous Every 12 hours 04/02/20 1937 04/03/20 1157   04/02/20 1930  vancomycin (VANCOCIN) IVPB 1000 mg/200 mL premix        1,000 mg 200 mL/hr over 60 Minutes Intravenous Every hour 04/02/20 1900 04/02/20 2129   04/02/20 1845  ceFEPIme (MAXIPIME) 2 g in sodium chloride 0.9 % 100 mL IVPB        2 g 200 mL/hr over 30 Minutes Intravenous  Once 04/02/20 1840 04/02/20 1944   04/02/20 1845  metroNIDAZOLE (FLAGYL) IVPB 500 mg        500 mg 100 mL/hr over 60 Minutes Intravenous  Once 04/02/20 1840 04/02/20 2228   04/02/20 1845  vancomycin (VANCOCIN) IVPB 1000 mg/200 mL premix  Status:  Discontinued        1,000 mg 200 mL/hr over 60 Minutes Intravenous  Once 04/02/20 1840 04/02/20 1900       Assessment/Plan Morbid obesity - BMI 60.48 Hypothroidism Abnormal low-density in the lower uterine segment/cervix - non-urgent  pelvic US recommended AKI - Cr 0.79 today, improving  Acute hypoxic respiratory failure - On nasal cannula   Acute pancreatitis Cholelithiasis/?Acute cholecystitis  - Tbili normalized and LFTs within normal limits, MRCP 6/25 negative for choledocholithiasis - WBC still elevated at 28.2, down slightly from 33.3 6/26 - some fullness in epigastric abdomen but non-tender - HIDA today concerning for possible cystic duct obstruction, however patient also has significant steatosis and hepatomegaly - possible lap chole later this week but discussing with bariatric surgeons as well given BMI of 60  FEN: CLD, IVF VTE: SCDs, lovenox ID: [id number] 6/25>>  LOS: 3 days     Juliet Rude , [facility] 04/06/2020, 12:18 PM Please see Amion for pager number during day hours 7:00am-4:30pm

## 2020-04-06 NOTE — Progress Notes (Signed)
[facility] ROUNDING NOTE   Subjective:  Sitting in chair, tachypneic.  Denies abdominal pain.  Tolerating clear liquids.  Objective: Vital signs in last 24 hours: Temp:  [97.7 F (36.5 C)-101.7 F (38.7 C)] 97.7 F (36.5 C) (06/28 1200) Pulse Rate:  [111-133] 115 (06/28 1135) Resp:  [22-53] 32 (06/28 1135) BP: (100-169)/(29-115) 169/45 (06/28 1135) SpO2:  [86 %-100 %] 97 % (06/28 1135) Weight:  [150 kg] 150 kg (06/28 0500) Last BM Date:  (PTA per patient) General: NAD Abdomen: Soft, no tenderness or distention    Intake/Output from previous day: 06/27 0701 - 06/28 0700 In: 5654 [P.O.:2760; I.V.:1798.1; IV Piggyback:1096] Out: 1125 [Urine:1125] Intake/Output this shift: Total I/O In: 489.5 [P.O.:240; I.V.:249.5] Out: 100 [Urine:100]   Lab Results: Recent Labs    04/04/20 1229 04/05/20 0405 04/06/20 0340  WBC 33.3* 29.8* 28.2*  HGB 14.7 14.3 13.3  PLT 336 347 402*  MCV 83.2 82.7 86.9   BMET Recent Labs    04/04/20 0318 04/05/20 0658 04/06/20 0340  NA 133* 139 138  K 4.0 4.3 4.4  CL 103 105 106  CO2 22 22 19*  GLUCOSE 149* 163* 137*  BUN 16 18 20   CREATININE 1.01* 1.02* 0.79  CALCIUM 6.7* 7.4* 7.9*   LFT Recent Labs    04/04/20 0318 04/05/20 0658 04/06/20 0340  PROT 5.6* 5.8* 6.2*  ALBUMIN 2.3* 2.2* 2.4*  AST 20 18 17   ALT 59* 35 29  ALKPHOS 89 84 83  BILITOT 2.0* 1.8* 1.2   PT/INR No results for input(s): INR in the last 72 hours.    Imaging/Other results: NM Hepatobiliary Liver Func  Result Date: 04/06/2020 CLINICAL DATA:  Abdominal pain. EXAM: NUCLEAR MEDICINE HEPATOBILIARY IMAGING TECHNIQUE: Sequential images of the abdomen were obtained out to 60 minutes following intravenous administration of radiopharmaceutical. RADIOPHARMACEUTICALS:  5.37 mCi Tc-12m  Choletec IV COMPARISON:  April 03, 2020.  April 02, 2020. FINDINGS: Prompt uptake and biliary excretion of activity by the liver is seen. However, there is no definite  evidence of uptake within the gallbladder, even after the administration of 3 mg of morphine intravenously. Biliary activity passes into small bowel, consistent with patent common bile duct. IMPRESSION: No definite evidence of uptake is noted within the gallbladder lumen, even after the administration of morphine intravenously. This is concerning for possible cystic duct obstruction and acute cholecystitis. Electronically Signed   By: April 05, 2020 M.D.   On: 04/06/2020 12:12      Assessment &Plan  39 year old female with acute pancreatitis likely secondary to biliary, has cholelithiasis and HIDA positive for cholecystitis/cystic duct obstruction MRCP negative for choledocholithiasis  Clinically improving slowly  Leukocytosis likely secondary to cholecystitis and pancreatitis  Tachypneic on oxygen  AKI improved  Continue IV fluids Clear liquids as tolerated Surgery planning for cholecystectomy  GI available if needed, please call    K. 04/08/2020 , MD [phone]  [facility]

## 2020-04-06 NOTE — Progress Notes (Signed)
PROGRESS NOTE  Amber May:[id number] DOB: [birth year] DOA: 04/02/2020 PCP: Patient, No Pcp Per  Brief History   39 year old woman PMH hypothyroidism, morbid obesity presented with abdominal pain and fever.  Admitted for severe acute pancreatitis, SIRS.  Treated with empiric antibiotics, aggressive IV fluids, seen by gastroenterology and general surgery.  Further work-up concerning for acute cholecystitis.  Condition slowly improving.  Cholecystectomy being considered.    A & P  Acute pancreatitis with SIRS uncomplicated by CT and MRCP but with severe clinical features including fever, acute hypoxic respiratory failure, tachypnea, lactic acidosis, leukocytosis, acute kidney injury, ascites, elevated LFTs.  Presumed gallstone pancreatitis.  Triglycerides without significant elevation.  No alcohol use.  Elevated LFTs on admission suggest the possibility of gallstone pancreatitis. --Now afebrile, slowly improving clinically, WBC slightly lower today, LFTs have normalized. --Continue empiric antibiotics.  Stop IV fluids and monitor intake/outtake. --Daily BMP and CBC --Appreciate gastroenterology and surgery.  Tolerating liquid diet.  Acute hypoxic respiratory failure, multifactorial, driving issue acute pancreatitis, possible cholecystitis, complicated by morbid obesity and probable obesity hypoventilation syndrome. --SPO2 100% on 6 L.  Wean as tolerated.  Remains tachypneic but overall appears better today. --Pulmonary toilet.  Was up to chair yesterday for 5 hours.  Acute cholecystitis suspected. --Continue empiric antibiotics.  Follow-up HIDA scan. --Appreciate general surgery management   Modest hypocalcemia, hypophosphatemia --Calcium essentially normal.  Phosphorus better but still low.  Replete.  Acute kidney injury secondary to acute pancreatitis --Resolved.  BUN within normal limits.  Stop fluids today and check BMP in a.m.  Hyperglycemia secondary to acute pancreatitis.   Hemoglobin A1c 5.4. --Mild in nature.  Continue sliding scale insulin.  Hepatomegaly and hepatic steatosis.  Likely related to morbid obesity. --Follow-up as an outpatient with gastroenterology.  Abnormal low-density in the lower uterine segment/cervix spanning 18 mm, nonspecific. Recommend nonemergent pelvic ultrasound for characterization on an elective outpatient basis.  Morbid obesity. Body mass index is 60.48 kg/m. --Dietitian consult once stable.  Disposition Plan:  Discussion: Remains ill but clinically improved with resolution of fever, slowly decreasing WBC and normalization of LFTs.  Still requiring 6 L oxygen which is new since admission and remains tachypneic.  Still requires close monitoring given illness but can downgrade to stepdown status.  Status is: Inpatient  Remains inpatient appropriate because:Inpatient level of care appropriate due to severity of illness  Dispo: The patient is from: Home              Anticipated d/c is to: Home              Anticipated d/c date is: > 3 days              Patient currently is not medically stable to d/c.   DVT prophylaxis:  enoxaparin (LOVENOX) injection 40 mg Start: 04/04/20 1800 SCDs Start: 04/03/20 0534   Code Status: Full Family Communication: mother at bedside again 6/28  Brendia Sacks, MD  [facility] Direct contact: see [email or web address] (further directions at bottom of note if needed) 7PM-7AM contact night coverage as at bottom of note 04/06/2020, 10:30 AM  LOS: 3 days   Significant Hospital Events   . 6/25 admitted for severe pancreatitis, fever   Consults:  . Gastroenterology . Critical care medicine . General surgery   Procedures:  .   Significant Diagnostic Tests:   6/25 CT abdomen pelvis acute edematous pancreatitis.  No acute peripancreatic collection.  Layering hyperdensity in the gallbladder may be represent sludge or stones.  Small  to moderate volume ascites.  Hepatomegaly.  Hepatic  steatosis.  6/25 MRCP Limited study.  Cholelithiasis, apparent gallbladder wall thickening.  No definite choledocholithiasis.  Mild edema pancreatic head.  6/25 right upper quadrant ultrasound.  Cholecystolithiasis, thickened gallbladder.  Consider HIDA.   Micro Data:  . 6/24 blood cultures no growth to date . 6/24 urine culture greater than 100,000 E. coli pansensitive   Antimicrobials:  . Cefepime > . Vancomycin >  Interval History/Subjective  No issues overnight.  Discussed with RN.  Set up for 5 hours in chair yesterday. Patient feels better.  Pain is adequately controlled.  Mother at bedside.  Objective   Vitals:  Vitals:   04/06/20 0708 04/06/20 0800  BP: (!) 162/72 (!) 100/38  Pulse: (!) 111 (!) 111  Resp: (!) 33 (!) 34  Temp:  97.7 F (36.5 C)  SpO2: 99% 100%    Exam:  Constitutional.  Appears calm, mildly uncomfortable, ill but not toxic. Cardiovascular.  Regular rate and rhythm.  No murmur, rub or gallop.  1-2+ bilateral lower extremity edema. Respiratory.  Clear to auscultation bilaterally.  No wheezes, rales or rhonchi.  Tachypneic.  Moderate increased respiratory effort.  Able to speak in sentences. Abdomen.  Obese.  Soft, nontender. Psychiatric.  Grossly normal mood and affect.  Speech fluent and appropriate.  I have personally reviewed the following:   Today's Data  . Urine output improved at 1125.  +14 L since admission. . CBG stable.  BMP unremarkable.  Phosphorus still low at 2.0.  Magnesium within normal limits.  LFTs unremarkable.  Total bilirubin has now normalized.  Calcium now near normal at 8.8 corrected. . WBC slowly trending down, 28.2.  Remainder CBC unremarkable. .   Scheduled Meds: . Chlorhexidine Gluconate Cloth  6 each Topical Daily  . enoxaparin (LOVENOX) injection  40 mg Subcutaneous Q24H  . insulin aspart  0-6 Units Subcutaneous Q6H  . levothyroxine  75 mcg Intravenous Daily  . mouth rinse  15 mL Mouth Rinse BID  . morphine  3 mg  Intravenous Once   Continuous Infusions: . sodium chloride Stopped (04/05/20 1702)  . ceFEPime (MAXIPIME) IV Stopped (04/06/20 0536)  . potassium PHOSPHATE IVPB (in mmol)    . vancomycin Stopped (04/06/20 0357)    Principal Problem:   Acute pancreatitis Active Problems:   SIRS (systemic inflammatory response syndrome) (HCC)   AKI (acute kidney injury) (HCC)   Hypothyroidism   Hyperglycemia   Elevated liver enzymes   Acute respiratory failure with hypoxemia (HCC)   Acute cholecystitis   Hypocalcemia   Hypophosphatemia   Morbid obesity with BMI of 50.0-59.9, adult (HCC)   LOS: 3 days   How to contact the [facility] Attending or Consulting provider 7A - 7P or covering provider during after hours 7P -7A, for this patient?  1. Check the care team in [facility] and look for a) attending/consulting [facility] provider listed and b) the [facility] team listed 2. Log into [email or web address] and use [place]'s universal password to access. If you do not have the password, please contact the hospital operator. 3. Locate the [facility] provider you are looking for under [facility] and page to a number that you can be directly reached. 4. If you still have difficulty reaching the provider, please page the [facility] (Director on Call) for the Hospitalists listed on amion for assistance.

## 2020-04-07 ENCOUNTER — Encounter (HOSPITAL_COMMUNITY): Payer: Self-pay | Admitting: Certified Registered"

## 2020-04-07 ENCOUNTER — Inpatient Hospital Stay (HOSPITAL_COMMUNITY): Payer: Self-pay

## 2020-04-07 ENCOUNTER — Encounter (HOSPITAL_COMMUNITY): Disposition: A | Discharge status: Home Health | Payer: Self-pay | Attending: Internal Medicine

## 2020-04-07 ENCOUNTER — Encounter (HOSPITAL_COMMUNITY): Payer: Self-pay | Admitting: Internal Medicine

## 2020-04-07 DIAGNOSIS — A419 Sepsis, unspecified organism: Principal | ICD-10-CM

## 2020-04-07 LAB — BASIC METABOLIC PANEL
Anion gap: 8 (ref 5–15)
BUN: 19 mg/dL (ref 6–20)
CO2: 21 mmol/L — ABNORMAL LOW (ref 22–32)
Calcium: 8 mg/dL — ABNORMAL LOW (ref 8.9–10.3)
Chloride: 104 mmol/L (ref 98–111)
Creatinine, Ser: 0.65 mg/dL (ref 0.44–1.00)
GFR calc Af Amer: >60 mL/min (ref >60)
GFR calc non Af Amer: >60 mL/min (ref >60)
Glucose, Bld: 166 mg/dL — ABNORMAL HIGH (ref 70–99)
Potassium: 4.5 mmol/L (ref 3.5–5.1)
Sodium: 133 mmol/L — ABNORMAL LOW (ref 135–145)

## 2020-04-07 LAB — CBC
HCT: 44.5 % (ref 36.0–46.0)
Hemoglobin: 13.7 g/dL (ref 12.0–15.0)
MCH: 25.8 pg — ABNORMAL LOW (ref 26.0–34.0)
MCHC: 30.8 g/dL (ref 30.0–36.0)
MCV: 83.6 fL (ref 80.0–100.0)
Platelets: 433 10*3/uL — ABNORMAL HIGH (ref 150–400)
RBC: 5.32 MIL/uL — ABNORMAL HIGH (ref 3.87–5.11)
RDW: 15.9 % — ABNORMAL HIGH (ref 11.5–15.5)
WBC: 27.5 10*3/uL — ABNORMAL HIGH (ref 4.0–10.5)
nRBC: 0 % (ref 0.0–0.2)

## 2020-04-07 LAB — CULTURE, BLOOD (ROUTINE X 2)
Culture: NO GROWTH
Culture: NO GROWTH

## 2020-04-07 LAB — GLUCOSE, CAPILLARY
Glucose-Capillary: 183 mg/dL — ABNORMAL HIGH (ref 70–99)
Glucose-Capillary: 149 mg/dL — ABNORMAL HIGH (ref 70–99)
Glucose-Capillary: 155 mg/dL — ABNORMAL HIGH (ref 70–99)

## 2020-04-07 LAB — BRAIN NATRIURETIC PEPTIDE: B Natriuretic Peptide: 141.6 pg/mL — ABNORMAL HIGH (ref 0.0–100.0)

## 2020-04-07 LAB — LACTIC ACID, PLASMA: Lactic Acid, Venous: 2 mmol/L (ref 0.5–1.9)

## 2020-04-07 SURGERY — LAPAROSCOPIC CHOLECYSTECTOMY WITH INTRAOPERATIVE CHOLANGIOGRAM
Anesthesia: General

## 2020-04-07 MED ORDER — FUROSEMIDE 10 MG/ML IJ SOLN
40.0000 mg | Freq: Once | INTRAMUSCULAR | Status: AC
  Administered 2020-04-07: 40 mg via INTRAVENOUS
  Filled 2020-04-07: qty 4

## 2020-04-07 MED ORDER — LIDOCAINE 2% (20 MG/ML) 5 ML SYRINGE
INTRAMUSCULAR | Status: AC
  Filled 2020-04-07: qty 5

## 2020-04-07 MED ORDER — PROPOFOL 10 MG/ML IV BOLUS
INTRAVENOUS | Status: AC
  Filled 2020-04-07: qty 40

## 2020-04-07 MED ORDER — FENTANYL CITRATE (PF) 100 MCG/2ML IJ SOLN
INTRAMUSCULAR | Status: AC
  Filled 2020-04-07: qty 2

## 2020-04-07 MED ORDER — SODIUM CHLORIDE 0.9 % IV SOLN: INTRAVENOUS | Status: DC

## 2020-04-07 MED ORDER — SUCCINYLCHOLINE CHLORIDE 200 MG/10ML IV SOSY
PREFILLED_SYRINGE | INTRAVENOUS | Status: AC
  Filled 2020-04-07: qty 10

## 2020-04-07 MED ORDER — MIDAZOLAM HCL 2 MG/2ML IJ SOLN
INTRAMUSCULAR | Status: AC
  Filled 2020-04-07: qty 2

## 2020-04-07 NOTE — Discharge Instructions (Signed)
[facility] LAPAROSCOPIC SURGERY: POST OP INSTRUCTIONS Always review your discharge instruction sheet given to you by the facility where your surgery was performed. IF YOU HAVE DISABILITY OR FAMILY LEAVE FORMS, YOU MUST BRING THEM TO THE OFFICE FOR PROCESSING.   DO NOT GIVE THEM TO YOUR DOCTOR.  PAIN CONTROL  1. First take acetaminophen (Tylenol) AND/or ibuprofen (Advil) to control your pain after surgery.  Follow directions on package.  Taking acetaminophen (Tylenol) and/or ibuprofen (Advil) regularly after surgery will help to control your pain and lower the amount of prescription pain medication you may need.  You should not take more than 3,000 mg (3 grams) of acetaminophen (Tylenol) in 24 hours.  You should not take ibuprofen (Advil), aleve, motrin, naprosyn or other NSAIDS if you have a history of stomach ulcers or chronic kidney disease.  2. A prescription for pain medication may be given to you upon discharge.  Take your pain medication as prescribed, if you still have uncontrolled pain after taking acetaminophen (Tylenol) or ibuprofen (Advil). 3. Use ice packs to help control pain. 4. If you need a refill on your pain medication, please contact your pharmacy.  They will contact our office to request authorization. Prescriptions will not be filled after 5pm or on week-ends.  HOME MEDICATIONS 5. Take your usually prescribed medications unless otherwise directed.  DIET 6. You should follow a light diet the first few days after arrival home.  Be sure to include lots of fluids daily. Avoid fatty, fried foods.   CONSTIPATION 7. It is common to experience some constipation after surgery and if you are taking pain medication.  Increasing fluid intake and taking a stool softener (such as Colace) will usually help or prevent this problem from occurring.  A mild laxative (Milk of Magnesia or Miralax) should be taken according to package instructions if there are no bowel  movements after 48 hours.  WOUND/INCISION CARE 8. Most patients will experience some swelling and bruising in the area of the incisions.  Ice packs will help.  Swelling and bruising can take several days to resolve.  9. Unless discharge instructions indicate otherwise, follow guidelines below  a. STERI-STRIPS - you may remove your outer bandages 48 hours after surgery, and you may shower at that time.  You have steri-strips (small skin tapes) in place directly over the incision.  These strips should be left on the skin for 7-10 days.   b. DERMABOND/SKIN GLUE - you may shower in 24 hours.  The glue will flake off over the next 2-3 weeks. 10. Any sutures or staples will be removed at the office during your follow-up visit.  ACTIVITIES 11. You may resume regular (light) daily activities beginning the next day--such as daily self-care, walking, climbing stairs--gradually increasing activities as tolerated.  You may have sexual intercourse when it is comfortable.  Refrain from any heavy lifting or straining until approved by your doctor. a. You may drive when you are no longer taking prescription pain medication, you can comfortably wear a seatbelt, and you can safely maneuver your car and apply brakes.  FOLLOW-UP 12. You should see your doctor in the office for a follow-up appointment approximately 2-3 weeks after your surgery.  You should have been given your post-op/follow-up appointment when your surgery was scheduled.  If you did not receive a post-op/follow-up appointment, make sure that you call for this appointment within a day or two after you arrive home to insure a convenient appointment time.     WHEN TO CALL YOUR DOCTOR: 1. Fever over 101.0 2. Inability to urinate 3. Continued bleeding from incision. 4. Increased pain, redness, or drainage from the incision. 5. Increasing abdominal pain  The clinic staff is available to answer your questions during regular business hours.  Please don't  hesitate to call and ask to speak to one of the nurses for clinical concerns.  If you have a medical emergency, go to the nearest emergency room or call 911.  A surgeon from [facility] is always on call at the hospital. [address], [address], [place], [place]  [address] ? [address] [address], [place], [place]   [address] [phone] ? [phone] ? FAX [phone] Web site: [email or web address]  .........   Managing Your Pain After Surgery Without Opioids    Thank you for participating in our program to help patients manage their pain after surgery without opioids. This is part of our effort to provide you with the best care possible, without exposing you or your family to the risk that opioids pose.  What pain can I expect after surgery? You can expect to have some pain after surgery. This is normal. The pain is typically worse the day after surgery, and quickly begins to get better. Many studies have found that many patients are able to manage their pain after surgery with Over-the-Counter (OTC) medications such as Tylenol and Motrin. If you have a condition that does not allow you to take Tylenol or Motrin, notify your surgical team.  How will I manage my pain? The best strategy for controlling your pain after surgery is around the clock pain control with Tylenol (acetaminophen) and Motrin (ibuprofen or Advil). Alternating these medications with each other allows you to maximize your pain control. In addition to Tylenol and Motrin, you can use heating pads or ice packs on your incisions to help reduce your pain.  How will I alternate your regular strength over-the-counter pain medication? You will take a dose of pain medication every three hours. ; Start by taking 650 mg of Tylenol (2 pills of 325 mg) ; 3 hours later take 600 mg of Motrin (3 pills of 200 mg) ; 3 hours after taking the Motrin take 650 mg of Tylenol ; 3 hours after that take 600 mg of  Motrin.   - 1 -  See example - if your first dose of Tylenol is at 12:00 PM   12:00 PM Tylenol 650 mg (2 pills of 325 mg)  3:00 PM Motrin 600 mg (3 pills of 200 mg)  6:00 PM Tylenol 650 mg (2 pills of 325 mg)  9:00 PM Motrin 600 mg (3 pills of 200 mg)  Continue alternating every 3 hours   We recommend that you follow this schedule around-the-clock for at least 3 days after surgery, or until you feel that it is no longer needed. Use the table on the last page of this handout to keep track of the medications you are taking. Important: Do not take more than 3000mg of Tylenol or 3200mg of Motrin in a 24-hour period. Do not take ibuprofen/Motrin if you have a history of bleeding stomach ulcers, severe kidney disease, &/or actively taking a blood thinner  What if I still have pain? If you have pain that is not controlled with the over-the-counter pain medications (Tylenol and Motrin or Advil) you might have what we call "breakthrough" pain. You will receive a prescription for a small amount of an opioid pain medication such as   Oxycodone, Tramadol, or Tylenol with Codeine. Use these opioid pills in the first 24 hours after surgery if you have breakthrough pain. Do not take more than 1 pill every 4-6 hours.  If you still have uncontrolled pain after using all opioid pills, don't hesitate to call our staff using the number provided. We will help make sure you are managing your pain in the best way possible, and if necessary, we can provide a prescription for additional pain medication.   Day 1    Time  Name of Medication Number of pills taken  Amount of Acetaminophen  Pain Level   Comments  AM PM       AM PM       AM PM       AM PM       AM PM       AM PM       AM PM       AM PM       Total Daily amount of Acetaminophen Do not take more than  3,000 mg per day      Day 2    Time  Name of Medication Number of pills taken  Amount of Acetaminophen  Pain Level   Comments  AM  PM       AM PM       AM PM       AM PM       AM PM       AM PM       AM PM       AM PM       Total Daily amount of Acetaminophen Do not take more than  3,000 mg per day      Day 3    Time  Name of Medication Number of pills taken  Amount of Acetaminophen  Pain Level   Comments  AM PM       AM PM       AM PM       AM PM          AM PM       AM PM       AM PM       AM PM       Total Daily amount of Acetaminophen Do not take more than  3,000 mg per day      Day 4    Time  Name of Medication Number of pills taken  Amount of Acetaminophen  Pain Level   Comments  AM PM       AM PM       AM PM       AM PM       AM PM       AM PM       AM PM       AM PM       Total Daily amount of Acetaminophen Do not take more than  3,000 mg per day      Day 5    Time  Name of Medication Number of pills taken  Amount of Acetaminophen  Pain Level   Comments  AM PM       AM PM       AM PM       AM PM       AM PM       AM PM       AM PM         AM PM       Total Daily amount of Acetaminophen Do not take more than  3,000 mg per day       Day 6    Time  Name of Medication Number of pills taken  Amount of Acetaminophen  Pain Level  Comments  AM PM       AM PM       AM PM       AM PM       AM PM       AM PM       AM PM       AM PM       Total Daily amount of Acetaminophen Do not take more than  3,000 mg per day      Day 7    Time  Name of Medication Number of pills taken  Amount of Acetaminophen  Pain Level   Comments  AM PM       AM PM       AM PM       AM PM       AM PM       AM PM       AM PM       AM PM       Total Daily amount of Acetaminophen Do not take more than  3,000 mg per day        For additional information about how and where to safely dispose of unused opioid medications - https://www.morepowerfulnc.org  Disclaimer: This document contains information and/or instructional materials adapted from [facility]  for the typical patient with your condition. It does not replace medical advice from your health care provider because your experience may differ from that of the typical patient. Talk to your health care provider if you have any questions about this document, your condition or your treatment plan. Adapted from [facility]  

## 2020-04-07 NOTE — Progress Notes (Signed)
General Surgery [facility]  Scheduled case for cholecystectomy this afternoon cancelled after evaluation by anesthesiology.  Concerns raised over pulmonary status and IV access.  Discussed with anesthesia, surgeons, and hospitalist.  Discussed with patient and her mother at the bedside.  Reviewed USN, MRI, and MRCP as well as HIDA scan.  Discussed with IR who will review studies this afternoon.  Plan to proceed with percutaneous drainage of gallbladder given persistent leukocytosis, gallbladder wall thickening, and intermittent pain.  Hopefully this would allow for maximal medical recovery over the coming days and weeks.  Will then coordinate with Dr. Daphine Deutscher and the bariatric surgeons for return to OR for cholecystectomy in approx 6 weeks when medically stable.  Patient and mother in agreement.  Will plan to schedule for tomorrow AM with IR.  Darnell Level, MD [facility] Office: [phone]

## 2020-04-07 NOTE — Progress Notes (Signed)
Pharmacy Antibiotic Note  Amber May is a 39 y.o. female admitted on 04/02/2020 with abdominal pain and subjective fever concerning for infection of unknown source, presumed gallstone pancreatitis. Patient does have E coli in urine though it is unclear if this represents asymptomatic bacteriuria. Organism is pan-sensitive. Pharmacy has been consulted for Cefepime dosing.   Plan: Continue Cefepime to 2gm q8h  F/U renal function, culture results, and clinical course  Height: 5\' 2"  (157.5 cm) Weight: (!) 154 kg (339 lb 8.1 oz) IBW/kg (Calculated) : 50.1  Temp (24hrs), Avg:98.3 F (36.8 C), Min:97.7 F (36.5 C), Max:99.2 F (37.3 C)  Recent Labs  Lab 04/03/20 0709 04/03/20 0709 04/03/20 0938 04/03/20 1337 04/03/20 1752 04/03/20 2231 04/04/20 0318 04/04/20 1229 04/05/20 0405 04/05/20 0658 04/06/20 0340 04/07/20 0258 04/07/20 0554  WBC 39.3*  --   --   --   --    < > 34.6* 33.3* 29.8*  --  28.2* 27.5*  --   CREATININE 1.22*  --   --   --   --   --  1.01*  --   --  1.02* 0.79 0.65  --   LATICACIDVEN 2.2*   < > 2.6* 2.2* 1.9  --  1.9  --   --   --   --   --  2.0*   < > = values in this interval not displayed.    Estimated Creatinine Clearance: 134 mL/min (by C-G formula based on SCr of 0.65 mg/dL).    No Known Allergies  Antimicrobials this admission: Cefepime 6/24 >>  Vancomycin 6/24 >> 6/28  Dose adjustments this admission: 6/25 Cefepime 2gm q12h > q8h 6/26 Vancomycin 1250mg  q12 > 1500mg  q12  Microbiology results: 6/24 BCx: NGF 6/24 UCx: > 100K E coli pan-sensitive  Thank you for allowing pharmacy to be a part of this patient's care.  , PharmD, BCPS 04/07/2020, 2:02 PM

## 2020-04-07 NOTE — Progress Notes (Signed)
Patient returned from short stay patient having increased work of breathing and shortness of breath. Patient having difficulty speaking in full sentences. Primary paged and headed to bedside. Plan to stop IV fluids, 1 time dose of Lasix IV STAT, and chest Xray. Per chart patient 17 litres positive but unable to quantify output since foley removal and patient incontinence.

## 2020-04-07 NOTE — Progress Notes (Addendum)
Pt. To short stay/pre-op for add on surgery- Lap Cholecystectomy from stepdown ICU. On arrival to [facility];  Increased work of breathing; pt tachypneic at 38-40; O2 sat 93% Room Air.  audible wheezing noted. Not able to speak ; nods head to questions.    Attempted to start another IV due to 22g AC not flush well.  Unsuccessful due to peripheral edema and mimimal veins.  Admitted with pancreantitis / cholecystitis/SIRS/Resp. Failure.   WBC 27.5 Lactic Acid 2.0  Called Dr. Arby Barrette and informed of above. reuqested to give her a Xopenex neb treatment  Dr. Arby Barrette came to [facility] to see patient and assess. Agreeable with neb treatment. Dr. Arby Barrette talked with Dr. Gerrit Friends and decision to cancell OR procedure.

## 2020-04-07 NOTE — Consult Note (Signed)
Chief Complaint: Patient was seen in consultation today for percutaneous cholecystostomy Chief Complaint  Patient presents with   Abdominal Pain    Referring Physician(s): Dr. Gerrit Friends, T   Supervising Physician: Malachy Moan  Patient Status: [facility]-pt  History of Present Illness: Amber May is a 39 y.o. female PMH hypothyroidism, morbid obesity who presented to [facility] ER on 04/02/20 with abdominal pain and fever. CT abdomen and pelvis without contrast showed acute edematous pancreatitis and patient was admitted. Further imaging during hospitalization reveled cholecystolithiasis and patient was scheduled to be taken to OR for laparoscopic cholecystectomy on 04/07/20; however the surgery had to be canceled after evaluation by anesthesiology due to raised concerns over pulmonary status and IV access. [facility] has requested IR to place percutaneous cholecystotomy. Patient today appears to be uncomfortable and is short of breath. Patient denies abdominal tenderness, fever, nausea, vomiting.  Past Medical History:  Diagnosis Date   Morbid obesity with BMI of 50.0-59.9, adult (HCC) 04/05/2020   Obesity    Thyroid disease     History reviewed. No pertinent surgical history.  Allergies: Patient has no known allergies.  Medications: Prior to Admission medications   Medication Sig Start Date End Date Taking? Authorizing Provider  levothyroxine (SYNTHROID) 125 MCG tablet Take 125 mcg by mouth daily before breakfast.   Yes [provider]     Family History  Problem Relation Age of Onset   Diabetes Mellitus II Maternal Grandmother     Social History   Socioeconomic History   Marital status: Single    Spouse name: Not on file   Number of children: Not on file   Years of education: Not on file   Highest education level: Not on file  Occupational History   Not on file  Tobacco Use   Smoking status: Never Smoker   Smokeless tobacco: Never Used  Substance  and Sexual Activity   Alcohol use: Not Currently   Drug use: Not Currently   Sexual activity: Not on file  Other Topics Concern   Not on file  Social History Narrative   Not on file   Social Determinants of Health   Financial Resource Strain:    Difficulty of Paying Living Expenses:   Food Insecurity:    Worried About Running Out of Food in the Last Year:    Barista in the Last Year:   Transportation Needs:    Freight forwarder (Medical):    Lack of Transportation (Non-Medical):   Physical Activity:    Days of Exercise per Week:    Minutes of Exercise per Session:   Stress:    Feeling of Stress :   Social Connections:    Frequency of Communication with Friends and Family:    Frequency of Social Gatherings with Friends and Family:    Attends Religious Services:    Active Member of Clubs or Organizations:    Attends Banker Meetings:    Marital Status:     Review of Systems: A 12 point ROS discussed and pertinent positives are indicated in the HPI above.  All other systems are negative.  Vital Signs: BP (!) 166/105 (BP Location: Right Wrist)    Pulse (!) 114    Temp 97.6 F (36.4 C) (Oral)    Resp (!) 32    Ht  (1.575 m)    Wt (!) 339 lb 8.1 oz (154 kg)    LMP 03/09/2020    SpO2 95%  BMI 62.10 kg/m   Physical Exam Constitutional:      Appearance: She is well-developed. She is obese.  Cardiovascular:     Rate and Rhythm: Tachycardia present.     Heart sounds: Normal heart sounds. No murmur heard.  No gallop.   Pulmonary:     Breath sounds: Normal breath sounds.     Comments: Tachypnic, accessory muscle used noted.  Abdominal:     General: Bowel sounds are normal.     Palpations: Abdomen is rigid. There is no shifting dullness or fluid wave.     Tenderness: There is no abdominal tenderness.  Skin:    General: Skin is warm and dry.     Coloration: Skin is not jaundiced.  Neurological:     Mental Status: She is  alert.     Imaging: CT ABDOMEN PELVIS WO CONTRAST  Result Date: 04/02/2020 CLINICAL DATA:  Abdominal distension. EXAM: CT ABDOMEN AND PELVIS WITHOUT CONTRAST TECHNIQUE: Multidetector CT imaging of the abdomen and pelvis was performed following the standard protocol without IV contrast. COMPARISON:  None. FINDINGS: Lower chest: Motion artifact through the lung bases. Elevation of right hemidiaphragm with compressive atelectasis. Possible trace right pleural effusion. Hepatobiliary: Enlarged liver spanning 20 cm cranial caudal with mild steatosis. No evidence of discrete focal lesion. Motion limits assessment for capsular nodularity. Layering hyperdensity in the gallbladder. Ascites adjacent to the gallbladder which is similar to the ascites adjacent to the liver. Common bile duct is not well-defined, there is no obvious biliary dilatation. Pancreas: Peripancreatic fat stranding and edema adjacent to the head and uncinate process. Small amount of non organized peripancreatic fluid. No ductal dilatation. No evidence of acute peripancreatic collection. Cannot assess for necrosis in the absence of IV contrast. Spleen: Normal in size without focal abnormality. Adrenals/Urinary Tract: Normal adrenal glands. No renal stones or hydronephrosis. Both ureters are decompressed without stones along the course. Urinary bladder is nondistended. Stomach/Bowel: Fluid/ingested material in the stomach. Mild duodenal wall thickening in the fourth portion adjacent to the pancreatic inflammation. There is no small bowel obstruction or inflammatory change. Detailed bowel assessment is limited in the absence of contrast and presence of abdominal ascites. The majority of the colon is decompressed. Vascular/Lymphatic: Normal caliber abdominal aorta. Few prominent periportal lymph nodes are likely reactive. Reproductive: Abnormal low-density in the lower uterine segment/cervix spanning 18 mm, nonspecific. No evidence of adnexal mass.  Other: Small to moderate volume of abdominopelvic ascites. There is free fluid in both upper quadrants, both pericolic gutters, mesentery, and tracking into the pelvis. No free air or organized fluid collection. Small fat containing umbilical hernia. Musculoskeletal: Degenerative change in the spine. There are no acute or suspicious osseous abnormalities. IMPRESSION: 1. Acute edematous pancreatitis. No evidence of acute peripancreatic collection. 2. Hepatomegaly and hepatic steatosis. Layering hyperdensity in the gallbladder may represent sludge or layering stones. 3. Small to moderate volume of abdominopelvic ascites. This may be in part related to pancreatitis, underlying liver disease, or combination thereof. 4. Abnormal low-density in the lower uterine segment/cervix spanning 18 mm, nonspecific. Recommend nonemergent pelvic ultrasound for characterization on an elective outpatient basis. Electronically Signed   By: Narda Rutherford M.D.   On: 04/02/2020 20:06   NM Hepatobiliary Liver Func  Result Date: 04/06/2020 CLINICAL DATA:  Abdominal pain. EXAM: NUCLEAR MEDICINE HEPATOBILIARY IMAGING TECHNIQUE: Sequential images of the abdomen were obtained out to 60 minutes following intravenous administration of radiopharmaceutical. RADIOPHARMACEUTICALS:  5.37 mCi Tc-79m  Choletec IV COMPARISON:  April 03, 2020.  April 02, 2020. FINDINGS: Prompt uptake and biliary excretion of activity by the liver is seen. However, there is no definite evidence of uptake within the gallbladder, even after the administration of 3 mg of morphine intravenously. Biliary activity passes into small bowel, consistent with patent common bile duct. IMPRESSION: No definite evidence of uptake is noted within the gallbladder lumen, even after the administration of morphine intravenously. This is concerning for possible cystic duct obstruction and acute cholecystitis. Electronically Signed   By: Lupita Raider M.D.   On: 04/06/2020 12:12   MR  ABDOMEN MRCP WO CONTRAST  Result Date: 04/03/2020 CLINICAL DATA:  Cholelithiasis. Known pancreatitis. Patient had difficulty breathing and was unable to complete the last series of the exam. EXAM: MRI ABDOMEN WITHOUT CONTRAST  (INCLUDING MRCP) TECHNIQUE: Multiplanar multisequence MR imaging of the abdomen was performed. Heavily T2-weighted images of the biliary and pancreatic ducts were obtained, and three-dimensional MRCP images were rendered by post processing. COMPARISON:  CT scan 04/02/2020 FINDINGS: Lower chest: Bilateral pleural effusions associated with bibasilar collapse/consolidative changes. Hepatobiliary: Liver measures 20.8 cm craniocaudal length, enlarged. Loss of signal in the liver parenchyma on out of phase T1 imaging suggests fatty deposition. No focal abnormality in the liver parenchyma on this noncontrast motion degraded study. Numerous tiny gallstones evident with apparent gallbladder wall thickening. No intrahepatic biliary duct dilatation. No extrahepatic biliary duct dilatation. Study is markedly limited, but no definite choledocholithiasis. Axial T2 haste imaging demonstrates some central flow artifact in the common duct, but no definite ductal stones. Pancreas: No main duct dilatation. No gross mass lesion in the pancreas. Mild edema seen in the region of the pancreatic head. Spleen:  No splenomegaly. No focal mass lesion. Adrenals/Urinary Tract: No adrenal nodule or mass. No hydronephrosis. No gross renal mass on this noncontrast study. Probable 11 mm cyst interpolar left kidney. Stomach/Bowel: Stomach is unremarkable. No gastric wall thickening. No evidence of outlet obstruction. Duodenum is normally positioned as is the ligament of Treitz. No small bowel or colonic dilatation within the visualized abdomen. Vascular/Lymphatic: No abdominal aortic aneurysm. There is no gastrohepatic or hepatoduodenal ligament lymphadenopathy. No retroperitoneal or mesenteric lymphadenopathy. Other:   Moderate volume intraperitoneal free fluid. Musculoskeletal: No suspicious marrow signal abnormality within the visualized bony anatomy. IMPRESSION: 1. Limited study due to motion artifact and lack of intravenous contrast. Patient was unable to complete the entire exam. 2. Cholelithiasis with apparent gallbladder wall thickening. No intrahepatic or extrahepatic biliary duct dilatation. No definite choledocholithiasis. If there is clinical concern for acute cholecystitis, abdominal ultrasound or nuclear scintigraphy may prove helpful to further evaluate. 3. Mild edema in the region of the pancreatic head. No main duct dilatation. Imaging features may be related to pancreatitis. 4. Hepatomegaly with steatosis. 5. Moderate volume intraperitoneal free fluid. 6. Bilateral pleural effusions associated with bibasilar collapse/consolidative changes. Electronically Signed   By: Kennith Center M.D.   On: 04/03/2020 12:02   MR 3D Recon At Scanner  Result Date: 04/03/2020 CLINICAL DATA:  Cholelithiasis. Known pancreatitis. Patient had difficulty breathing and was unable to complete the last series of the exam. EXAM: MRI ABDOMEN WITHOUT CONTRAST  (INCLUDING MRCP) TECHNIQUE: Multiplanar multisequence MR imaging of the abdomen was performed. Heavily T2-weighted images of the biliary and pancreatic ducts were obtained, and three-dimensional MRCP images were rendered by post processing. COMPARISON:  CT scan 04/02/2020 FINDINGS: Lower chest: Bilateral pleural effusions associated with bibasilar collapse/consolidative changes. Hepatobiliary: Liver measures 20.8 cm craniocaudal length, enlarged. Loss of signal in the liver parenchyma on out of  phase T1 imaging suggests fatty deposition. No focal abnormality in the liver parenchyma on this noncontrast motion degraded study. Numerous tiny gallstones evident with apparent gallbladder wall thickening. No intrahepatic biliary duct dilatation. No extrahepatic biliary duct dilatation.  Study is markedly limited, but no definite choledocholithiasis. Axial T2 haste imaging demonstrates some central flow artifact in the common duct, but no definite ductal stones. Pancreas: No main duct dilatation. No gross mass lesion in the pancreas. Mild edema seen in the region of the pancreatic head. Spleen:  No splenomegaly. No focal mass lesion. Adrenals/Urinary Tract: No adrenal nodule or mass. No hydronephrosis. No gross renal mass on this noncontrast study. Probable 11 mm cyst interpolar left kidney. Stomach/Bowel: Stomach is unremarkable. No gastric wall thickening. No evidence of outlet obstruction. Duodenum is normally positioned as is the ligament of Treitz. No small bowel or colonic dilatation within the visualized abdomen. Vascular/Lymphatic: No abdominal aortic aneurysm. There is no gastrohepatic or hepatoduodenal ligament lymphadenopathy. No retroperitoneal or mesenteric lymphadenopathy. Other:  Moderate volume intraperitoneal free fluid. Musculoskeletal: No suspicious marrow signal abnormality within the visualized bony anatomy. IMPRESSION: 1. Limited study due to motion artifact and lack of intravenous contrast. Patient was unable to complete the entire exam. 2. Cholelithiasis with apparent gallbladder wall thickening. No intrahepatic or extrahepatic biliary duct dilatation. No definite choledocholithiasis. If there is clinical concern for acute cholecystitis, abdominal ultrasound or nuclear scintigraphy may prove helpful to further evaluate. 3. Mild edema in the region of the pancreatic head. No main duct dilatation. Imaging features may be related to pancreatitis. 4. Hepatomegaly with steatosis. 5. Moderate volume intraperitoneal free fluid. 6. Bilateral pleural effusions associated with bibasilar collapse/consolidative changes. Electronically Signed   By: Kennith Center M.D.   On: 04/03/2020 12:02   DG CHEST PORT 1 VIEW  Result Date: 04/07/2020 CLINICAL DATA:  Shortness of breath, acute  pancreatitis EXAM: PORTABLE CHEST 1 VIEW COMPARISON:  Portable exam 1528 hours compared to 04/06/2020 FINDINGS: Rotated to the LEFT. Normal heart size and mediastinal contours. Significantly decreased lung volumes with bibasilar atelectasis versus infiltrate. Upper lungs clear. No pleural effusion or pneumothorax. Bones unremarkable. IMPRESSION: Significantly decreased lung volumes with bibasilar atelectasis versus infiltrate. Electronically Signed   By: Ulyses Southward M.D.   On: 04/07/2020 15:54   DG CHEST PORT 1 VIEW  Result Date: 04/06/2020 CLINICAL DATA:  Shortness of breath, tachycardia EXAM: PORTABLE CHEST 1 VIEW COMPARISON:  04/04/2020 FINDINGS: Cardiomegaly. Low lung volumes with vascular congestion and bibasilar atelectasis. Suspect small layering effusions. No acute bony abnormality. IMPRESSION: Small bilateral effusions with bibasilar opacities, likely atelectasis. Cardiomegaly, vascular congestion. Electronically Signed   By: Charlett Nose M.D.   On: 04/06/2020 23:41   DG CHEST PORT 1 VIEW  Result Date: 04/04/2020 CLINICAL DATA:  Acute respiratory failure EXAM: PORTABLE CHEST 1 VIEW COMPARISON:  April 03, 2020 FINDINGS: Bilateral pleural effusions with underlying opacities. No other changes. IMPRESSION: Stable bilateral effusions with underlying opacities, favored represent atelectasis. No other change. Recommend follow-up to resolution. Electronically Signed   By: Gerome Sam III M.D   On: 04/04/2020 08:37   DG CHEST PORT 1 VIEW  Result Date: 04/03/2020 CLINICAL DATA:  Hypoxia, shortness of breath EXAM: PORTABLE CHEST 1 VIEW COMPARISON:  04/02/2020 FINDINGS: Examination is significantly limited secondary to poor penetration from patient body habitus, semiupright portable technique, and poor inspiratory effort. Very low lung volumes with accentuation of the heart size. Hazy and streaky bibasilar opacities suggesting small bilateral pleural effusions with bibasilar atelectasis. No pneumothorax.  IMPRESSION:  Limited examination. Hazy and streaky bibasilar opacities suggesting small bilateral pleural effusions with bibasilar atelectasis. Electronically Signed   By: Duanne GuessNicholas  Plundo D.O.   On: 04/03/2020 13:59   DG Chest Port 1 View  Result Date: 04/02/2020 CLINICAL DATA:  Sepsis EXAM: PORTABLE CHEST 1 VIEW COMPARISON:  None. FINDINGS: Low lung volume with hypoventilatory change. Atelectasis at the right base. Borderline cardiomegaly augmented by low lung volume. No pneumothorax. IMPRESSION: Low lung volume with atelectasis at the right base. Electronically Signed   By: Jasmine PangKim  Fujinaga M.D.   On: 04/02/2020 20:08   US Abdomen Limited RUQ  Result Date: 04/03/2020 CLINICAL DATA:  Pancreatitis. EXAM: ULTRASOUND ABDOMEN LIMITED RIGHT UPPER QUADRANT COMPARISON:  MRCP 04/03/2020 FINDINGS: The examination is limited due to patient body habitus. Gallbladder: Cholecystolithiasis. Thickened appearance of the gallbladder wall measuring 5 mm. No sonographic Eulah PontMurphy sign is elicited by the scanning technologist. Common bile duct: Diameter: The common duct is dilated measuring 7 mm in diameter. Liver: Generalized increased hepatic parenchymal echogenicity. No definite focal liver lesion is identified. Portal vein is patent on color Doppler imaging with normal direction of blood flow towards the liver. Other: Moderate ascites within the visualized abdomen. IMPRESSION: Limited examination as described. There is cholecystolithiasis. Additionally, there is a thickened appearance of the gallbladder wall measuring 5 mm. Acute cholecystitis cannot be excluded, although gallbladder wall thickening is a nonspecific finding in the setting of abdominal ascites, and a nuclear medicine HIDA scan may be helpful for further assessment. Mild extrahepatic biliary ductal dilatation with the common duct measuring 7 mm in diameter. Hyperechogenicity of the hepatic parenchyma. Findings are consistent with the hepatic steatosis demonstrated  on same day MRCP. Moderate ascites within the visualized abdomen. Electronically Signed   By: Jackey LogeKyle  Golden DO   On: 04/03/2020 17:05    Labs:  CBC: Recent Labs    04/04/20 1229 04/05/20 0405 04/06/20 0340 04/07/20 0258  WBC 33.3* 29.8* 28.2* 27.5*  HGB 14.7 14.3 13.3 13.7  HCT 47.4* 46.0 44.3 44.5  PLT 336 347 402* 433*    COAGS: Recent Labs    04/02/20 1847  APTT 26    BMP: Recent Labs    04/04/20 0318 04/05/20 0658 04/06/20 0340 04/07/20 0258  NA 133* 139 138 133*  K 4.0 4.3 4.4 4.5  CL 103 105 106 104  CO2 22 22 19* 21*  GLUCOSE 149* 163* 137* 166*  BUN 16 18 20 19   CALCIUM 6.7* 7.4* 7.9* 8.0*  CREATININE 1.01* 1.02* 0.79 0.65  GFRNONAA >60 >60 >60 >60  GFRAA >60 >60 >60 >60    LIVER FUNCTION TESTS: Recent Labs    04/03/20 0709 04/04/20 0318 04/05/20 0658 04/06/20 0340  BILITOT 2.4* 2.0* 1.8* 1.2  AST 33 20 18 17   ALT 101* 59* 35 29  ALKPHOS 96 89 84 83  PROT 5.8* 5.6* 5.8* 6.2*  ALBUMIN 2.8* 2.3* 2.2* 2.4*    TUMOR MARKERS: No results for input(s): AFPTM, CEA, CA199, CHROMGRNA in the last 8760 hours.  Assessment and Plan: Acute cholecystitis.  Amber May is a 39 y.o. female PMH hypothyroidism, morbid obesity who [facility] with abdominal pain and fever. CT abdomen and pelvis without contrast showed acute edematous pancreatitis and patient was admitted. Further imaging during hospitalization reveled cholecystolithiasis and patient was scheduled to be taken to OR for laparoscopic cholecystectomy on 04/07/20; however the surgery had to be canceled after evaluation by anesthesiology due to raised concerns over pulmonary status and IV  access. [facility] has requested IR to place percutaneous cholecystotomy. Imaging studies were reviewed by Dr. Miles Costain.   Procedure was discussed with the patient and her mother. Risks and benefits discussed with the patient including bleeding, infection, and damage to adjacent structures.  All of the  patient's questions were answered, patient is agreeable to proceed. Consent signed and in chart.  Case scheduled for 6/30  Thank you for this interesting consult.  I greatly enjoyed meeting SKI POLICH and look forward to participating in their care.  A copy of this report was sent to the requesting provider on this date.  Electronically Signed: D. Jeananne Rama, PA-C 04/07/2020, 4:34 PM   I spent a total of 30 minutes  in face to face in clinical consultation, greater than 50% of which was counseling/coordinating care for percutaneous cholecystostomy.

## 2020-04-07 NOTE — TOC Initial Note (Signed)
Transition of Care [facility]) - Initial/Assessment Note    Patient Details  Name: Amber May MRN: [id number] Date of Birth: [birth year]  [facility]) CM/SW Contact:    Golda Acre, RN Phone Number: 04/07/2020, 9:47 AM  Clinical Narrative:                 Acute pancreatitis Iv maxipime, WBC-27.5 Plan: will follow for toc needs, none at present time. Expected Discharge Plan: Home/Self Care Barriers to Discharge: Continued Medical Work up   Patient Goals and CMS Choice Patient states their goals for this hospitalization and ongoing recovery are:: to go back home CMS Medicare.gov Compare Post Acute Care list provided to:: Patient Choice offered to / list presented to : Patient  Expected Discharge Plan and Services Expected Discharge Plan: Home/Self Care   Discharge Planning Services: CM Consult   Living arrangements for the past 2 months: Single Family Home                                      Prior Living Arrangements/Services Living arrangements for the past 2 months: Single Family Home Lives with:: Self Patient language and need for interpreter reviewed:: No Do you feel safe going back to the place where you live?: Yes      Need for Family Participation in Patient Care: Yes (Comment) Care giver support system in place?: Yes (comment)   Criminal Activity/Legal Involvement Pertinent to Current Situation/Hospitalization: No - Comment as needed  Activities of Daily Living Home Assistive Devices/Equipment: Bedside commode/3-in-1 ADL Screening (condition at time of admission) Patient's cognitive ability adequate to safely complete daily activities?: No Is the patient deaf or have difficulty hearing?: No Does the patient have difficulty seeing, even when wearing glasses/contacts?: No Does the patient have difficulty concentrating, remembering, or making decisions?: No Patient able to express need for assistance with ADLs?: Yes Does the patient have  difficulty dressing or bathing?: Yes Independently performs ADLs?: No Does the patient have difficulty walking or climbing stairs?: Yes Weakness of Legs: None Weakness of Arms/Hands: None  Permission Sought/Granted Permission sought to share information with : Case Manager                Emotional Assessment Appearance:: Appears stated age     Orientation: : Oriented to Self, Oriented to Place, Oriented to  Time, Oriented to Situation   Psych Involvement: No (comment)  Admission diagnosis:  Epigastric abdominal pain [R10.13] Elevated liver enzymes [R74.8] SIRS (systemic inflammatory response syndrome) (HCC) [R65.10] AKI (acute kidney injury) (HCC) [N17.9] Elevated lipase [R74.8] Sepsis, due to unspecified organism, unspecified whether acute organ dysfunction present [facility]) [A41.9] Acute pancreatitis [K85.90] Patient Active Problem List   Diagnosis Date Noted  . Cholelithiasis   . Epigastric abdominal pain   . Acute cholecystitis 04/05/2020  . Hypocalcemia 04/05/2020  . Hypophosphatemia 04/05/2020  . Morbid obesity with BMI of 50.0-59.9, adult (HCC) 04/05/2020  . AKI (acute kidney injury) (HCC) 04/03/2020  . Acute pancreatitis 04/03/2020  . Hypothyroidism 04/03/2020  . Hyperglycemia 04/03/2020  . Elevated liver enzymes   . Acute respiratory failure with hypoxemia (HCC)   . SIRS (systemic inflammatory response syndrome) (HCC) 04/02/2020   PCP:  Patient, No Pcp Per Pharmacy:   [id number] - [place], [place] - [address] [place] [address] Phone: [phone] Fax: [phone]     Social Determinants of Health (SDOH) Interventions  Readmission Risk Interventions No flowsheet data found.

## 2020-04-07 NOTE — Progress Notes (Signed)
PROGRESS NOTE  VALBONA SLABACH QJJ:[id number] DOB: [birth year] DOA: 04/02/2020 PCP: Patient, No Pcp Per  Brief History   39 year old woman PMH hypothyroidism, morbid obesity presented with abdominal pain and fever.  Admitted for severe acute pancreatitis, SIRS.  Treated with empiric antibiotics, aggressive IV fluids, seen by gastroenterology and general surgery.  Further work-up concerning for acute cholecystitis.  Condition slowly improving.  Cholecystectomy being planned.  Overall responding well to treatment, tolerating liquids.  Anticipate discharge in the next 3 to 4 days.  A & P  Acute pancreatitis with SIRS uncomplicated by CT and MRCP but with severe clinical features including fever, acute hypoxic respiratory failure, tachypnea, lactic acidosis, leukocytosis, acute kidney injury, ascites, elevated LFTs.  Presumed gallstone pancreatitis.  Triglycerides without significant elevation.  No alcohol use.  Elevated LFTs on admission consistent with gallstone pancreatitis. --Afebrile greater than 24 hours.  WBC slowly trending down.  LFTs unremarkable on last check and lipase was within normal limits last check.   --Continue empiric cefepime  --Continue liquid diet, currently on hold for possible surgery today.    Acute hypoxic respiratory failure, multifactorial, driving issue acute pancreatitis, possible cholecystitis, complicated by morbid obesity and probable obesity hypoventilation syndrome. --Resolved.  Acute cholecystitis.  HIDA scan was abnormal. --Continue empiric antibiotics.  --Plan for cholecystectomy per surgery.  Modest hypocalcemia, hypophosphatemia --Repleted phosphorus.  Calcium slowly trending up. --Check BMP, magnesium and phosphorus in a.m.  Acute kidney injury secondary to acute pancreatitis --Resolved.  BUN within normal limits.  Follow BMP.  Hyperglycemia secondary to acute pancreatitis.  Hemoglobin A1c 5.4. --Overall mild in nature.  Continue sliding scale  insulin.  Hepatomegaly and hepatic steatosis.  Likely related to morbid obesity. --Follow-up as an outpatient with gastroenterology.  Abnormal low-density in the lower uterine segment/cervix spanning 18 mm, nonspecific. Recommend nonemergent pelvic ultrasound for characterization on an elective outpatient basis.  Morbid obesity. Body mass index is 62.1 kg/m. --Dietitian consult  Disposition Plan:  Discussion: Continues to slowly improve with resolution of hypoxia, pain controlled, tolerating diet.  Plan for cholecystectomy per surgery.  Continue empiric antibiotics.  Status is: Inpatient  Remains inpatient appropriate because:Inpatient level of care appropriate due to severity of illness  Dispo: The patient is from: Home              Anticipated d/c is to: Home              Anticipated d/c date is: 3 days              Patient currently is not medically stable to d/c.  DVT prophylaxis:  SCDs Start: 04/03/20 0534 can start enoxaparin after surgery. Code Status: Full Family Communication: mother at bedside again 6/29  Brendia Sacks, MD  [facility] Direct contact: see [email or web address] (further directions at bottom of note if needed) 7PM-7AM contact night coverage as at bottom of note 04/07/2020, 12:36 PM  LOS: 4 days   Significant Hospital Events   . 6/25 admitted for severe pancreatitis, fever   Consults:  . Gastroenterology . Critical care medicine . General surgery   Procedures:  .   Significant Diagnostic Tests:   6/25 CT abdomen pelvis acute edematous pancreatitis.  No acute peripancreatic collection.  Layering hyperdensity in the gallbladder may be represent sludge or stones.  Small to moderate volume ascites.  Hepatomegaly.  Hepatic steatosis.  6/25 MRCP Limited study.  Cholelithiasis, apparent gallbladder wall thickening.  No definite choledocholithiasis.  Mild edema pancreatic head.  6/25 right upper quadrant ultrasound.  Cholecystolithiasis, thickened  gallbladder.  Consider HIDA.   Micro Data:  . 6/24 blood cultures no growth to date . 6/24 urine culture greater than 100,000 E. coli pansensitive   Antimicrobials:  . Cefepime 6/24 >  Interval History/Subjective  Continues to improve.  Pain controlled. Now off oxygen.  Objective   Vitals:  Vitals:   04/07/20 0900 04/07/20 1155  BP: (!) 155/95   Pulse: (!) 114   Resp: (!) 25   Temp:  99.2 F (37.3 C)  SpO2: 91%     Exam:  Constitutional.  Appears calm and comfortable.  Appears better today.  Off oxygen. Cardiovascular.  Regular rate and rhythm.  No murmur, rub or gallop.  2+ bilateral lower extremity edema although difficult to assess given obesity. Respiratory.  Clear to auscultation bilaterally.  No wheezes, rales or rhonchi.  Normal respiratory effort.  Decreased respiratory rate.  Mild increased work of breathing. Abdomen.  Obese.  Soft. Psychiatric.  Grossly normal mood and affect.  Speech fluent and appropriate.  I have personally reviewed the following:   Today's Data  . Urine output 950+ multiple voids.  I/O not accurate. . CBG stable . BMP unremarkable . BNP insignificant elevation.  Lactic acid trivial elevation.  No further measurements indicated based on clinical status.  No suspicion for sepsis or decompensation. . WBC slowly trending down, 27.5. Marland Kitchen Chest x-ray 6/28 reviewed.  Low lung volumes.  No acute disease.  Scheduled Meds: . Chlorhexidine Gluconate Cloth  6 each Topical Daily  . enoxaparin (LOVENOX) injection  0.5 mg/kg Subcutaneous Q24H  . insulin aspart  0-6 Units Subcutaneous Q6H  . levothyroxine  75 mcg Intravenous Daily  . mouth rinse  15 mL Mouth Rinse BID  . morphine       Continuous Infusions: . sodium chloride Stopped (04/05/20 1702)  . sodium chloride    . ceFEPime (MAXIPIME) IV Stopped (04/07/20 0630)    Principal Problem:   Acute pancreatitis Active Problems:   SIRS (systemic inflammatory response syndrome) (HCC)   AKI  (acute kidney injury) (HCC)   Hypothyroidism   Hyperglycemia   Elevated liver enzymes   Acute respiratory failure with hypoxemia (HCC)   Acute cholecystitis   Hypocalcemia   Hypophosphatemia   Morbid obesity with BMI of 50.0-59.9, adult (HCC)   Cholelithiasis   Epigastric abdominal pain   LOS: 4 days   How to contact the [facility] Attending or Consulting provider 7A - 7P or covering provider during after hours 7P -7A, for this patient?  1. Check the care team in [facility] and look for a) attending/consulting [facility] provider listed and b) the [facility] team listed 2. Log into [email or web address] and use [facility] universal password to access. If you do not have the password, please contact the hospital operator. 3. Locate the [facility] provider you are looking for under [facility] and page to a number that you can be directly reached. 4. If you still have difficulty reaching the provider, please page the [facility] (Director on Call) for the Hospitalists listed on amion for assistance.

## 2020-04-07 NOTE — Progress Notes (Signed)
Assessment & Plan: Acute pancreatitis, cholelithiasis, cholecystitis  - Tbili normalized and LFTs within normal limits - MRCP 6/25 negative for choledocholithiasis - WBC slowly improving, 27K this AM - HIDA with non-vis of gallbladder - planning lap chole this week - attempting to schedule with assistance of Dr. Daphine Deutscher from bariatric surgery  FEN: CLD, IVF VTE: SCDs, lovenox ID: cefepime 6/25>>  Morbid obesity - BMI 60.48 Hypothroidism        Amber Level, MD       [facility]       Office: [phone]   Chief Complaint: Biliary pancreatitis  Subjective: Patient in bed, stepdown, mother at bedside.  Intermittent back pain.  Objective: Vital signs in last 24 hours: Temp:  [97.7 F (36.5 C)-98.4 F (36.9 C)] 98.1 F (36.7 C) (06/29 0400) Pulse Rate:  [110-124] 110 (06/29 0700) Resp:  [22-35] 22 (06/29 0700) BP: (122-169)/(45-115) 143/91 (06/29 0600) SpO2:  [91 %-97 %] 94 % (06/29 0730) Weight:  [154 kg] 154 kg (06/29 0500) Last BM Date:  (PTA)  Intake/Output from previous day: 06/28 0701 - 06/29 0700 In: 2655.8 [P.O.:1820; I.V.:286; IV Piggyback:549.8] Out: 950 [Urine:950] Intake/Output this shift: No intake/output data recorded.  Physical Exam: HEENT - sclerae clear, mucous membranes moist Neck - soft Chest - clear bilaterally Cor - tachycardic @ 114 Abdomen - soft, obese, minimal tenderness; no appreciable mass   Lab Results:  Recent Labs    04/06/20 0340 04/07/20 0258  WBC 28.2* 27.5*  HGB 13.3 13.7  HCT 44.3 44.5  PLT 402* 433*   BMET Recent Labs    04/06/20 0340 04/07/20 0258  NA 138 133*  K 4.4 4.5  CL 106 104  CO2 19* 21*  GLUCOSE 137* 166*  BUN 20 19  CREATININE 0.79 0.65  CALCIUM 7.9* 8.0*   PT/INR No results for input(s): LABPROT, INR in the last 72 hours. Comprehensive Metabolic Panel:    Component Value Date/Time   NA 133 (L) 04/07/2020 0258   NA 138 04/06/2020 0340   K 4.5 04/07/2020 0258   K  4.4 04/06/2020 0340   CL 104 04/07/2020 0258   CL 106 04/06/2020 0340   CO2 21 (L) 04/07/2020 0258   CO2 19 (L) 04/06/2020 0340   BUN 19 04/07/2020 0258   BUN 20 04/06/2020 0340   CREATININE 0.65 04/07/2020 0258   CREATININE 0.79 04/06/2020 0340   GLUCOSE 166 (H) 04/07/2020 0258   GLUCOSE 137 (H) 04/06/2020 0340   CALCIUM 8.0 (L) 04/07/2020 0258   CALCIUM 7.9 (L) 04/06/2020 0340   AST 17 04/06/2020 0340   AST 18 04/05/2020 0658   ALT 29 04/06/2020 0340   ALT 35 04/05/2020 0658   ALKPHOS 83 04/06/2020 0340   ALKPHOS 84 04/05/2020 0658   BILITOT 1.2 04/06/2020 0340   BILITOT 1.8 (H) 04/05/2020 0658   PROT 6.2 (L) 04/06/2020 0340   PROT 5.8 (L) 04/05/2020 0658   ALBUMIN 2.4 (L) 04/06/2020 0340   ALBUMIN 2.2 (L) 04/05/2020 0658    Studies/Results: NM Hepatobiliary Liver Func  Result Date: 04/06/2020 CLINICAL DATA:  Abdominal pain. EXAM: NUCLEAR MEDICINE HEPATOBILIARY IMAGING TECHNIQUE: Sequential images of the abdomen were obtained out to 60 minutes following intravenous administration of radiopharmaceutical. RADIOPHARMACEUTICALS:  5.37 mCi Tc-2m  Choletec IV COMPARISON:  April 03, 2020.  April 02, 2020. FINDINGS: Prompt uptake and biliary excretion of activity by the liver is seen. However, there is no definite evidence of uptake within the gallbladder, even after  the administration of 3 mg of morphine intravenously. Biliary activity passes into small bowel, consistent with patent common bile duct. IMPRESSION: No definite evidence of uptake is noted within the gallbladder lumen, even after the administration of morphine intravenously. This is concerning for possible cystic duct obstruction and acute cholecystitis. Electronically Signed   By: Lupita Raider M.D.   On: 04/06/2020 12:12   DG CHEST PORT 1 VIEW  Result Date: 04/06/2020 CLINICAL DATA:  Shortness of breath, tachycardia EXAM: PORTABLE CHEST 1 VIEW COMPARISON:  04/04/2020 FINDINGS: Cardiomegaly. Low lung volumes with vascular  congestion and bibasilar atelectasis. Suspect small layering effusions. No acute bony abnormality. IMPRESSION: Small bilateral effusions with bibasilar opacities, likely atelectasis. Cardiomegaly, vascular congestion. Electronically Signed   By: Charlett Nose M.D.   On: 04/06/2020 23:41      Amber May 04/07/2020  Patient ID: Amber May, female   DOB: [birth year], 39 y.o.   MRN: [id number]

## 2020-04-08 ENCOUNTER — Inpatient Hospital Stay (HOSPITAL_COMMUNITY): Payer: Self-pay

## 2020-04-08 HISTORY — PX: IR PERC CHOLECYSTOSTOMY: IMG2326

## 2020-04-08 HISTORY — PX: IR PARACENTESIS: IMG2679

## 2020-04-08 LAB — PROTIME-INR
INR: 1.2 (ref 0.8–1.2)
Prothrombin Time: 14.8 seconds (ref 11.4–15.2)

## 2020-04-08 LAB — GLUCOSE, CAPILLARY
Glucose-Capillary: 118 mg/dL — ABNORMAL HIGH (ref 70–99)
Glucose-Capillary: 125 mg/dL — ABNORMAL HIGH (ref 70–99)
Glucose-Capillary: 128 mg/dL — ABNORMAL HIGH (ref 70–99)
Glucose-Capillary: 145 mg/dL — ABNORMAL HIGH (ref 70–99)
Glucose-Capillary: 150 mg/dL — ABNORMAL HIGH (ref 70–99)

## 2020-04-08 LAB — CBC
HCT: 42.9 % (ref 36.0–46.0)
Hemoglobin: 13.1 g/dL (ref 12.0–15.0)
MCH: 25.8 pg — ABNORMAL LOW (ref 26.0–34.0)
MCHC: 30.5 g/dL (ref 30.0–36.0)
MCV: 84.6 fL (ref 80.0–100.0)
Platelets: 424 10*3/uL — ABNORMAL HIGH (ref 150–400)
RBC: 5.07 MIL/uL (ref 3.87–5.11)
RDW: 15.9 % — ABNORMAL HIGH (ref 11.5–15.5)
WBC: 28.1 10*3/uL — ABNORMAL HIGH (ref 4.0–10.5)
nRBC: 0.1 % (ref 0.0–0.2)

## 2020-04-08 LAB — MAGNESIUM: Magnesium: 2.7 mg/dL — ABNORMAL HIGH (ref 1.7–2.4)

## 2020-04-08 LAB — BASIC METABOLIC PANEL
Anion gap: 10 (ref 5–15)
BUN: 23 mg/dL — ABNORMAL HIGH (ref 6–20)
CO2: 21 mmol/L — ABNORMAL LOW (ref 22–32)
Calcium: 8.1 mg/dL — ABNORMAL LOW (ref 8.9–10.3)
Chloride: 104 mmol/L (ref 98–111)
Creatinine, Ser: 0.69 mg/dL (ref 0.44–1.00)
GFR calc Af Amer: >60 mL/min (ref >60)
GFR calc non Af Amer: >60 mL/min (ref >60)
Glucose, Bld: 125 mg/dL — ABNORMAL HIGH (ref 70–99)
Potassium: 4.7 mmol/L (ref 3.5–5.1)
Sodium: 135 mmol/L (ref 135–145)

## 2020-04-08 LAB — BODY FLUID CELL COUNT WITH DIFFERENTIAL
Other Cells, Fluid: UNDETERMINED %
Total Nucleated Cell Count, Fluid: UNDETERMINED cu mm (ref 0–1000)

## 2020-04-08 LAB — PHOSPHORUS: Phosphorus: 3 mg/dL (ref 2.5–4.6)

## 2020-04-08 MED ORDER — LIDOCAINE HCL 1 % IJ SOLN
INTRAMUSCULAR | Status: AC
  Filled 2020-04-08: qty 20

## 2020-04-08 MED ORDER — FLUMAZENIL 0.5 MG/5ML IV SOLN
INTRAVENOUS | Status: AC
  Filled 2020-04-08: qty 5

## 2020-04-08 MED ORDER — MIDAZOLAM HCL 2 MG/2ML IJ SOLN
INTRAMUSCULAR | Status: AC
  Filled 2020-04-08: qty 2

## 2020-04-08 MED ORDER — MIDAZOLAM HCL 2 MG/2ML IJ SOLN
INTRAMUSCULAR | Status: AC | PRN
  Administered 2020-04-08 (×8): 1 mg via INTRAVENOUS

## 2020-04-08 MED ORDER — FENTANYL CITRATE (PF) 100 MCG/2ML IJ SOLN
INTRAMUSCULAR | Status: AC
  Filled 2020-04-08: qty 2

## 2020-04-08 MED ORDER — FENTANYL CITRATE (PF) 100 MCG/2ML IJ SOLN
INTRAMUSCULAR | Status: AC | PRN
  Administered 2020-04-08 (×4): 50 ug via INTRAVENOUS

## 2020-04-08 MED ORDER — SODIUM CHLORIDE 0.9% FLUSH
5.0000 mL | Freq: Three times a day (TID) | INTRAVENOUS | Status: DC
  Administered 2020-04-08 – 2020-04-19 (×27): 5 mL

## 2020-04-08 MED ORDER — NALOXONE HCL 0.4 MG/ML IJ SOLN
INTRAMUSCULAR | Status: AC
  Filled 2020-04-08: qty 1

## 2020-04-08 MED ORDER — ENOXAPARIN SODIUM 80 MG/0.8ML ~~LOC~~ SOLN
75.0000 mg | SUBCUTANEOUS | Status: DC
  Administered 2020-04-09 – 2020-04-15 (×7): 75 mg via SUBCUTANEOUS
  Filled 2020-04-08: qty 0.8
  Filled 2020-04-08: qty 0.75
  Filled 2020-04-08 (×2): qty 0.8
  Filled 2020-04-08 (×2): qty 0.75
  Filled 2020-04-08: qty 0.8

## 2020-04-08 MED ORDER — IOHEXOL 300 MG/ML  SOLN
50.0000 mL | Freq: Once | INTRAMUSCULAR | Status: AC | PRN
  Administered 2020-04-08: 12 mL

## 2020-04-08 MED ORDER — LIDOCAINE HCL (PF) 1 % IJ SOLN
INTRAMUSCULAR | Status: AC | PRN
  Administered 2020-04-08: 10 mL via INTRADERMAL

## 2020-04-08 NOTE — Procedures (Signed)
Interventional Radiology Procedure Note  Procedure:  1.) Paracentesis yielding 4.8L bilious ascites.  2.) Placement of transhepatic 65F perc cholecystostomy tube.    Complications: None  Estimated Blood Loss: None  Recommendations: - Ascites sent for culture, cell count, bilirubin, cytology - Drain to bag, flush once per shift  Signed,  Sterling Big, MD

## 2020-04-08 NOTE — Progress Notes (Signed)
MEDICATION-RELATED CONSULT NOTE   IR Procedure Consult - Anticoagulant/Antiplatelet PTA/Inpatient Med List Review by Pharmacist    Procedure: Placement of transhepatic perc cholecystostomy tube    Completed: 6/30 @ 1551  Post-Procedural bleeding risk per IR MD assessment: STANDARD  Antithrombotic medications on inpatient or PTA profile prior to procedure:  Lovenox 0.5 mg/kg daily    Recommended restart time per IR Post-Procedure Guidelines:  POD1 AM   Other considerations:  No complications; CBC remains stable WNL   Plan:     Resume Lovenox 75 mg SQ daily tomorrow AM   Bernadene Person, PharmD, BCPS [phone] 04/08/2020, 6:02 PM

## 2020-04-08 NOTE — Progress Notes (Signed)
PROGRESS NOTE    Amber May  VWU:[id number] DOB: [birth year] DOA: 04/02/2020 PCP: Patient, No Pcp Per   Brief Narrative:  39 year old woman PMH hypothyroidism, morbid obesity presented with abdominal pain and fever.  Admitted for severe acute pancreatitis, SIRS.  Treated with empiric antibiotics, aggressive IV fluids, seen by gastroenterology and general surgery.  Further work-up concerning for acute cholecystitis.  Condition slowly improving.  Cholecystectomy unable to be performed - IR evaluating for cholecystostomy tube placement.  Assessment & Plan:   Principal Problem:   Acute pancreatitis Active Problems:   SIRS (systemic inflammatory response syndrome) (HCC)   AKI (acute kidney injury) (HCC)   Hypothyroidism   Hyperglycemia   Elevated liver enzymes   Acute respiratory failure with hypoxemia (HCC)   Acute cholecystitis   Hypocalcemia   Hypophosphatemia   Morbid obesity with BMI of 50.0-59.9, adult (HCC)   Cholelithiasis   Epigastric abdominal pain  Acute pancreatitis with severe clinical features including fever, acute hypoxic respiratory failure, tachypnea, lactic acidosis, leukocytosis, acute kidney injury, ascites, elevated LFTs.  - Presumed gallstone pancreatitis.  Triglycerides without significant elevation.  No alcohol use.  Elevated LFTs on admission consistent with gallstone pancreatitis. - Procalcitonin downtrending, leukocytosis stable, remains afebrile  - Continue empiric cefepime   - Currently n.p.o. pending procedure; likely resume diet pending evaluation operatively  Acute hypoxic respiratory failure, multifactorial  -Likely primarily secondary to acute pancreatitis/cholecystitis -Likely complicated by morbid obesity and probable chronic obesity hypoventilation syndrome. SpO2: 95 % O2 Flow Rate (L/min): 2 L/min  Acute cholecystitis, questionable.   - HIDA scan was abnormal. -PT cefepime, pending PERC drain today with IR  Hypocalcemia,  hypophosphatemia -Likely in the setting of poor p.o. intake -Check BMP, magnesium and phosphorus in a.m.  Acute kidney injury secondary to poor p.o. intake and acute pancreatitis -Continue to increase p.o. intake as tolerated post procedure -Follow repeat labs  Hyperglycemia -Likely secondary to acute pancreatitis.  Hemoglobin A1c 5.4. --Overall mild in nature.  Continue sliding scale insulin.  Hepatomegaly and hepatic steatosis.  Likely related to morbid obesity. --Follow-up as an outpatient with gastroenterology.  Abnormal low-density in the lower uterine segment/cervix spanning 18 mm, nonspecific. Recommend nonemergent pelvic ultrasound for characterization on an elective outpatient basis.  Morbid obesity. Body mass index is 62.1 kg/m. --Dietitian consult   DVT prophylaxis: SCDs periprocedure Code Status: Full Family Communication: At bedside  Status is: Inpatient  Dispo: The patient is from: Home              Anticipated d/c is to: Home              Anticipated d/c date is: 1872-96h              Patient currently NOT medically stable for discharge due to ongoing need for close monitoring, procedure and surgical evaluation.   Consultants:   Gen surgery, IR  Procedures:   Percutaneous cholecystostomy tube pending 04/08/2020  Antimicrobials:  Cefepime   Subjective: No acute issues or events overnight, family at bedside, patient states her abdominal pain is improving denies nausea vomiting diarrhea constipation headache fevers or chills.  Objective: Vitals:   04/08/20 0330 04/08/20 0400 04/08/20 0500 04/08/20 0600  BP:  135/81 105/84 128/84  Pulse:  (!) 104 (!) 104 (!) 106  Resp:  (!) 21 20 (!) 27  Temp: 98.4 F (36.9 C)     TempSrc: Axillary     SpO2:  96% 91% 94%  Weight:      Height:  Intake/Output Summary (Last 24 hours) at 04/08/2020 6387 Last data filed at 04/08/2020 0600 Gross per 24 hour  Intake 269.12 ml  Output 750 ml  Net -480.88 ml    Filed Weights   04/06/20 0500 04/07/20 0500 04/08/20 0326  Weight: (!) 150 kg (!) 154 kg (!) 153.5 kg    Examination:  General:  Pleasantly resting in bed, No acute distress. HEENT:  Normocephalic atraumatic.  Sclerae nonicteric, noninjected.  Extraocular movements intact bilaterally. Neck:  Without mass or deformity.  Trachea is midline. Lungs:  Clear to auscultate bilaterally without rhonchi, wheeze, or rales. Heart:  Regular rate and rhythm.  Without murmurs, rubs, or gallops. Abdomen:  Soft, obese, minimally tender at the right upper quadrant, nondistended.  Without guarding or rebound. Extremities: Without cyanosis, clubbing, edema, or obvious deformity. Vascular:  Dorsalis pedis and posterior tibial pulses palpable bilaterally. Skin:  Warm and dry, no erythema, no ulcerations.   Data Reviewed: I have personally reviewed following labs and imaging studies  CBC: Recent Labs  Lab 04/03/20 0709 04/03/20 0709 04/03/20 2231 04/03/20 2231 04/04/20 0318 04/04/20 0318 04/04/20 1229 04/05/20 0405 04/06/20 0340 04/07/20 0258 04/08/20 0132  WBC 39.3*   < > 38.3*   < > 34.6*   < > 33.3* 29.8* 28.2* 27.5* 28.1*  NEUTROABS 34.2*  --  33.8*  --  30.0*  --   --  25.6* 22.2*  --   --   HGB 16.0*   < > 15.5*   < > 15.0   < > 14.7 14.3 13.3 13.7 13.1  HCT 50.5*   < > 48.7*   < > 48.1*   < > 47.4* 46.0 44.3 44.5 42.9  MCV 83.1   < > 82.5   < > 82.8   < > 83.2 82.7 86.9 83.6 84.6  PLT 333   < > 289   < > 305   < > 336 347 402* 433* 424*   < > = values in this interval not displayed.   Basic Metabolic Panel: Recent Labs  Lab 04/03/20 0709 04/03/20 0709 04/04/20 0318 04/05/20 0658 04/06/20 0340 04/07/20 0258 04/08/20 0132  NA 135   < > 133* 139 138 133* 135  K 4.1   < > 4.0 4.3 4.4 4.5 4.7  CL 106   < > 103 105 106 104 104  CO2 18*   < > 22 22 19* 21* 21*  GLUCOSE 172*   < > 149* 163* 137* 166* 125*  BUN 21*   < > 16 18 20 19  23*  CREATININE 1.22*   < > 1.01* 1.02* 0.79  0.65 0.69  CALCIUM 6.4*   < > 6.7* 7.4* 7.9* 8.0* 8.1*  MG 1.5*  --  2.0 2.2 2.4  --  2.7*  PHOS  --   --  2.1* 1.8* 2.0*  --  3.0   < > = values in this interval not displayed.   GFR: Estimated Creatinine Clearance: 133.7 mL/min (by C-G formula based on SCr of 0.69 mg/dL). Liver Function Tests: Recent Labs  Lab 04/02/20 1847 04/03/20 0709 04/04/20 0318 04/05/20 0658 04/06/20 0340  AST 74* 33 20 18 17   ALT 168* 101* 59* 35 29  ALKPHOS 130* 96 89 84 83  BILITOT 4.0* 2.4* 2.0* 1.8* 1.2  PROT 7.7 5.8* 5.6* 5.8* 6.2*  ALBUMIN 3.8 2.8* 2.3* 2.2* 2.4*   Recent Labs  Lab 04/02/20 1847 04/04/20 0725 04/05/20 0658  LIPASE 154* 44 33   No  results for input(s): AMMONIA in the last 168 hours. Coagulation Profile: Recent Labs  Lab 04/08/20 0132  INR 1.2   Cardiac Enzymes: No results for input(s): CKTOTAL, CKMB, CKMBINDEX, TROPONINI in the last 168 hours. BNP (last 3 results) No results for input(s): PROBNP in the last 8760 hours. HbA1C: No results for input(s): HGBA1C in the last 72 hours. CBG: Recent Labs  Lab 04/07/20 0531 04/07/20 1117 04/07/20 1826 04/08/20 0009 04/08/20 0600  GLUCAP 183* 149* 155* 128* 150*   Lipid Profile: No results for input(s): CHOL, HDL, LDLCALC, TRIG, CHOLHDL, LDLDIRECT in the last 72 hours. Thyroid Function Tests: No results for input(s): TSH, T4TOTAL, FREET4, T3FREE, THYROIDAB in the last 72 hours. Anemia Panel: No results for input(s): VITAMINB12, FOLATE, FERRITIN, TIBC, IRON, RETICCTPCT in the last 72 hours. Sepsis Labs: Recent Labs  Lab 04/03/20 0709 04/03/20 8563 04/03/20 1337 04/03/20 1752 04/04/20 0318 04/05/20 0658 04/07/20 0554  PROCALCITON 1.45  --   --   --  1.16 0.96  --   LATICACIDVEN 2.2*   < > 2.2* 1.9 1.9  --  2.0*   < > = values in this interval not displayed.    Recent Results (from the past 240 hour(s))  Blood Culture (routine x 2)     Status: None   Collection Time: 04/02/20  6:45 PM   Specimen: BLOOD    Result Value Ref Range Status   Specimen Description   Final    BLOOD LEFT ANTECUBITAL Performed at [facility], [address] [place], [place] [address]    Special Requests   Final    BOTTLES DRAWN AEROBIC AND ANAEROBIC Blood Culture results may not be optimal due to an excessive volume of blood received in culture bottles Performed at [facility], [address] [place], [place] [address]    Culture   Final    NO GROWTH 5 DAYS Performed at [facility], [address] [place], [place] [address]    Report Status 04/07/2020 FINAL  Final  Urine culture     Status: Abnormal   Collection Time: 04/02/20  6:47 PM   Specimen: In/Out Cath Urine  Result Value Ref Range Status   Specimen Description   Final    IN/OUT CATH URINE Performed at [facility], [address] [place], [place] [address]    Special Requests   Final    NONE Performed at [facility], [address] [place], [place] [address]    Culture >=100,000 COLONIES/mL ESCHERICHIA COLI (A)  Final   Report Status 04/05/2020 FINAL  Final   Organism ID, Bacteria ESCHERICHIA COLI (A)  Final      Susceptibility   Escherichia coli - MIC*    AMPICILLIN 8 SENSITIVE Sensitive     CEFAZOLIN <=4 SENSITIVE Sensitive     CEFTRIAXONE <=0.25 SENSITIVE Sensitive     CIPROFLOXACIN <=0.25 SENSITIVE Sensitive     GENTAMICIN <=1 SENSITIVE Sensitive     IMIPENEM <=0.25 SENSITIVE Sensitive     NITROFURANTOIN <=16 SENSITIVE Sensitive     TRIMETH/SULFA <=20 SENSITIVE Sensitive     AMPICILLIN/SULBACTAM 4 SENSITIVE Sensitive     PIP/TAZO <=4 SENSITIVE Sensitive     * >=100,000 COLONIES/mL ESCHERICHIA COLI  Blood Culture (routine x 2)     Status: None   Collection Time: 04/02/20  7:00 PM   Specimen: BLOOD  Result Value Ref Range Status   Specimen Description   Final  BLOOD BLOOD RIGHT HAND Performed at [facility], [address] [place], [place] [address]    Special  Requests   Final    BOTTLES DRAWN AEROBIC ONLY Blood Culture results may not be optimal due to an inadequate volume of blood received in culture bottles Performed at [facility], [address] [place], [place] [address]    Culture   Final    NO GROWTH 5 DAYS Performed at [facility], [address] [place], [place] [address]    Report Status 04/07/2020 FINAL  Final  SARS Coronavirus 2 by RT PCR (hospital order, performed in [facility] hospital lab) Nasopharyngeal Nasopharyngeal Swab     Status: None   Collection Time: 04/02/20 11:13 PM   Specimen: Nasopharyngeal Swab  Result Value Ref Range Status   SARS Coronavirus 2 NEGATIVE NEGATIVE Final    Comment: (NOTE) SARS-CoV-2 target nucleic acids are NOT DETECTED.  The SARS-CoV-2 RNA is generally detectable in upper and lower respiratory specimens during the acute phase of infection. The lowest concentration of SARS-CoV-2 viral copies this assay can detect is 250 copies / mL. A negative result does not preclude SARS-CoV-2 infection and should not be used as the sole basis for treatment or other patient management decisions.  A negative result may occur with improper specimen collection / handling, submission of specimen other than nasopharyngeal swab, presence of viral mutation(s) within the areas targeted by this assay, and inadequate number of viral copies (<250 copies / mL). A negative result must be combined with clinical observations, patient history, and epidemiological information.  Fact Sheet for Patients:   BoilerBrush.com.cy  Fact Sheet for Healthcare Providers: https://pope.com/  This test is not yet approved or  cleared by the [place] FDA and has been authorized for detection and/or diagnosis of SARS-CoV-2 by FDA under an Emergency Use Authorization (EUA).  This EUA will remain in effect (meaning this test can be used) for the duration of  the COVID-19 declaration under Section 564(b)(1) of the Act, 21 U.S.C. section 360bbb-3(b)(1), unless the authorization is terminated or revoked sooner.  Performed at [facility], [address] [place], [place] [address]   MRSA PCR Screening     Status: None   Collection Time: 04/04/20  9:08 AM   Specimen: Nasal Mucosa; Nasopharyngeal  Result Value Ref Range Status   MRSA by PCR NEGATIVE NEGATIVE Final    Comment:        The GeneXpert MRSA Assay (FDA approved for NASAL specimens only), is one component of a comprehensive MRSA colonization surveillance program. It is not intended to diagnose MRSA infection nor to guide or monitor treatment for MRSA infections. Performed at [facility], [address] [place], [place] [address]          Radiology Studies: NM Hepatobiliary Liver Func  Result Date: 04/06/2020 CLINICAL DATA:  Abdominal pain. EXAM: NUCLEAR MEDICINE HEPATOBILIARY IMAGING TECHNIQUE: Sequential images of the abdomen were obtained out to 60 minutes following intravenous administration of radiopharmaceutical. RADIOPHARMACEUTICALS:  5.37 mCi Tc-31m  Choletec IV COMPARISON:  April 03, 2020.  April 02, 2020. FINDINGS: Prompt uptake and biliary excretion of activity by the liver is seen. However, there is no definite evidence of uptake within the gallbladder, even after the administration of 3 mg of morphine intravenously. Biliary activity passes into small bowel, consistent with patent common bile duct. IMPRESSION: No definite evidence of uptake is noted within the gallbladder lumen,  even after the administration of morphine intravenously. This is concerning for possible cystic duct obstruction and acute cholecystitis. Electronically Signed   By: Lupita Raider M.D.   On: 04/06/2020 12:12   DG CHEST PORT 1 VIEW  Result Date: 04/07/2020 CLINICAL DATA:  Shortness of breath, acute pancreatitis EXAM: PORTABLE CHEST 1 VIEW COMPARISON:  Portable  exam 1528 hours compared to 04/06/2020 FINDINGS: Rotated to the LEFT. Normal heart size and mediastinal contours. Significantly decreased lung volumes with bibasilar atelectasis versus infiltrate. Upper lungs clear. No pleural effusion or pneumothorax. Bones unremarkable. IMPRESSION: Significantly decreased lung volumes with bibasilar atelectasis versus infiltrate. Electronically Signed   By: Ulyses Southward M.D.   On: 04/07/2020 15:54   DG CHEST PORT 1 VIEW  Result Date: 04/06/2020 CLINICAL DATA:  Shortness of breath, tachycardia EXAM: PORTABLE CHEST 1 VIEW COMPARISON:  04/04/2020 FINDINGS: Cardiomegaly. Low lung volumes with vascular congestion and bibasilar atelectasis. Suspect small layering effusions. No acute bony abnormality. IMPRESSION: Small bilateral effusions with bibasilar opacities, likely atelectasis. Cardiomegaly, vascular congestion. Electronically Signed   By: Charlett Nose M.D.   On: 04/06/2020 23:41   Scheduled Meds: . Chlorhexidine Gluconate Cloth  6 each Topical Daily  . insulin aspart  0-6 Units Subcutaneous Q6H  . levothyroxine  75 mcg Intravenous Daily  . mouth rinse  15 mL Mouth Rinse BID  . morphine       Continuous Infusions: . sodium chloride Stopped (04/05/20 1702)  . ceFEPime (MAXIPIME) IV 2 g (04/08/20 0616)     LOS: 5 days    Time spent:  Azucena Fallen, DO [facility]  If 7PM-7AM, please contact night-coverage [email or web address]  04/08/2020, 7:02 AM

## 2020-04-08 NOTE — Progress Notes (Signed)
1 Day Post-Op    CC: Abdominal pain  Subjective: Respiratory rate of 29.  She does not complain of any shortness of breath but does not notice any difference.  She remains tachycardic.  She denies any abdominal pain but does complain of back pain.  Objective: Vital signs in last 24 hours: Temp:  [97.6 F (36.4 C)-99.9 F (37.7 C)] 98.4 F (36.9 C) (06/30 0330) Pulse Rate:  [102-121] 105 (06/30 0800) Resp:  [20-38] 22 (06/30 0800) BP: (99-166)/(62-105) 136/71 (06/30 0800) SpO2:  [91 %-98 %] 92 % (06/30 0800) Weight:  [153.5 kg] 153.5 kg (06/30 0326) Last BM Date:  (PTA) 269 IV 750 urine recorded No BM recorded Afebrile, respiratory rate 21-35 Tachycardic heart rate 102-121 O2 sats 92-95% on 2 L nasal cannula Weight 153.5 kg BMP stable WBC 28.1 INR 1.2 Intake/Output from previous day: 06/29 0701 - 06/30 0700 In: 269.1 [I.V.:2.2; IV Piggyback:267] Out: 750 [Urine:750] Intake/Output this shift: Total I/O In: 100 [IV Piggyback:100] Out: -   General appearance: alert, cooperative and no distress Resp: clear to auscultation bilaterally GI: Large abdomen, she is nontender on anterior palpation.  Does complain of some back pain.  Bowel sounds are hypoactive.  Lab Results:  Recent Labs    04/07/20 0258 04/08/20 0132  WBC 27.5* 28.1*  HGB 13.7 13.1  HCT 44.5 42.9  PLT 433* 424*    BMET Recent Labs    04/07/20 0258 04/08/20 0132  NA 133* 135  K 4.5 4.7  CL 104 104  CO2 21* 21*  GLUCOSE 166* 125*  BUN 19 23*  CREATININE 0.65 0.69  CALCIUM 8.0* 8.1*   PT/INR Recent Labs    04/08/20 0132  LABPROT 14.8  INR 1.2    Recent Labs  Lab 04/02/20 1847 04/03/20 0709 04/04/20 0318 04/05/20 0658 04/06/20 0340  AST 74* 33 20 18 17   ALT 168* 101* 59* 35 29  ALKPHOS 130* 96 89 84 83  BILITOT 4.0* 2.4* 2.0* 1.8* 1.2  PROT 7.7 5.8* 5.6* 5.8* 6.2*  ALBUMIN 3.8 2.8* 2.3* 2.2* 2.4*     Lipase     Component Value Date/Time   LIPASE 33 04/05/2020 0658      Medications: . Chlorhexidine Gluconate Cloth  6 each Topical Daily  . insulin aspart  0-6 Units Subcutaneous Q6H  . levothyroxine  75 mcg Intravenous Daily  . mouth rinse  15 mL Mouth Rinse BID  . morphine       . sodium chloride Stopped (04/05/20 1702)  . ceFEPime (MAXIPIME) IV Stopped (04/08/20 04/10/20)   Anti-infectives (From admission, onward)   Start     Dose/Rate Route Frequency Ordered Stop   04/04/20 1300  vancomycin (VANCOREADY) IVPB 1500 mg/300 mL  Status:  Discontinued        1,500 mg 150 mL/hr over 120 Minutes Intravenous Every 12 hours 04/04/20 1014 04/06/20 1157   04/03/20 2000  vancomycin (VANCOREADY) IVPB 1750 mg/350 mL  Status:  Discontinued        1,750 mg 175 mL/hr over 120 Minutes Intravenous Every 24 hours 04/02/20 1937 04/03/20 1157   04/03/20 1400  ceFEPIme (MAXIPIME) 2 g in sodium chloride 0.9 % 100 mL IVPB     Discontinue     2 g 200 mL/hr over 30 Minutes Intravenous Every 8 hours 04/03/20 1157     04/03/20 1300  vancomycin (VANCOREADY) IVPB 1250 mg/250 mL  Status:  Discontinued        1,250 mg 166.7 mL/hr over 90 Minutes  Intravenous Every 12 hours 04/03/20 1157 04/04/20 1014   04/03/20 0600  ceFEPIme (MAXIPIME) 2 g in sodium chloride 0.9 % 100 mL IVPB  Status:  Discontinued        2 g 200 mL/hr over 30 Minutes Intravenous Every 12 hours 04/02/20 1937 04/03/20 1157   04/02/20 1930  vancomycin (VANCOCIN) IVPB 1000 mg/200 mL premix        1,000 mg 200 mL/hr over 60 Minutes Intravenous Every hour 04/02/20 1900 04/02/20 2129   04/02/20 1845  ceFEPIme (MAXIPIME) 2 g in sodium chloride 0.9 % 100 mL IVPB        2 g 200 mL/hr over 30 Minutes Intravenous  Once 04/02/20 1840 04/02/20 1944   04/02/20 1845  metroNIDAZOLE (FLAGYL) IVPB 500 mg        500 mg 100 mL/hr over 60 Minutes Intravenous  Once 04/02/20 1840 04/02/20 2228   04/02/20 1845  vancomycin (VANCOCIN) IVPB 1000 mg/200 mL premix  Status:  Discontinued        1,000 mg 200 mL/hr over 60 Minutes Intravenous   Once 04/02/20 1840 04/02/20 1900      Assessment/Plan Acute hypoxic respiratory failure -multifactorial AKI Hyperglycemia Hepatomegaly/hepatic steatosis Morbid obesity BMI 61.9  Acute pancreatitis, cholelithiasis, cholecystitis  -Tbili normalized and LFTs within normal limits - MRCP 6/25 negative for choledocholithiasis - WBC slowly improving, 27K this AM - HIDA with non-vis of gallbladder - laparoscopic cholecystectomy canceled by anesthesia for pulmonary issues 6/29  - IR drain 6/30  FEN: N.p.o. IV fluids ID: Vancomycin 6/24-6/26; cefepime 6/26 >> day 7 DVT: Lovenox on hold.    Plan: IR will attempt to place a drain later today.  Continue antibiotics and medical management.  LOS: 5 days    Ramesses Crampton 04/08/2020 Please see Amion

## 2020-04-08 NOTE — Progress Notes (Signed)
Initial Nutrition Assessment  DOCUMENTATION CODES:   Morbid obesity  INTERVENTION:  - diet advancement as medically feasible.  NUTRITION DIAGNOSIS:   Increased nutrient needs related to acute illness as evidenced by estimated needs.  GOAL:   Patient will meet greater than or equal to 90% of their needs  MONITOR:   Diet advancement, Labs, Weight trends  REASON FOR ASSESSMENT:   Consult Assessment of nutrition requirement/status  ASSESSMENT:   39 year old female with medical history of hypothyroidism and morbid obesity. She presented with abdominal pain and fever. She was admitted for severe acute pancreatitis and SIRS. Further work-up concerning for acute cholecystitis. Cholecystectomy unable to be performed - IR evaluating for cholecystostomy tube placement.  Patient was admitted on 6/24 and was NPO until 6/27 at 0930 when diet was advanced to CLD. She was made NPO again yesterday at 0830. No intakes documented during the time that she was on CLD.  Patient sleeping soundly while sitting in the chair at the time of attempted visit. No family/visitors present.   Per chart review, weight on 6/24 was 325 lb and weight today is 338 lb.  Per notes: - acute pancreatitis  - AKI - ascites - multifactorial acute hypoxic respiratory failure - s/p HIDA scan showing possible acute cholecystitis  - hepatomegaly and hepatic steatosis  - Surgery following and plan for lap chole 6/29 canceled d/t respiratory issues--possible IR drain placement today (6/30)  Labs reviewed; CBGs: 128, 150, 145 mg/dl, BUN: 23 mg/dl, Ca: 8.1 mg/dl, Mg: 2.7 mg/dl. Medications reviewed; 40 mg IV lasix x1 dose 6/29, sliding scale novolog, 75 mcg IV synthroid/day.    NUTRITION - FOCUSED PHYSICAL EXAM:  unable to complete at this time.   Diet Order:   Diet Order            Diet NPO time specified  Diet effective midnight                 EDUCATION NEEDS:   Not appropriate for education at this  time  Skin:  Skin Assessment: Reviewed RN Assessment  Last BM:  PTA/unknown  Height:   Ht Readings from Last 1 Encounters:  04/03/20 5\' 2"  (1.575 m)    Weight:   Wt Readings from Last 1 Encounters:  04/08/20 (!) 153.5 kg    Estimated Nutritional Needs:  Kcal:  2300-2500 kcal Protein:  120-135 grams Fluid:  >/= 2.3 L/day     04/10/20, MS, RD, LDN, CNSC Inpatient Clinical Dietitian RD pager # available in AMION  After hours/weekend pager # available in [facility]

## 2020-04-08 NOTE — Evaluation (Signed)
Physical Therapy Evaluation Patient Details Name: Amber May MRN: [id number] DOB: 03-Jan-1981 Today's Date: 04/08/2020   History of Present Illness  39 year old woman PMH hypothyroidism, morbid obesity presented with abdominal pain and fever and admitted for acute pancreatitis with severe clinical features including fever, acute hypoxic respiratory failure, tachypnea, lactic acidosis, leukocytosis, acute kidney injury, ascites, elevated LFTs  Clinical Impression  Pt admitted with above diagnosis. Pt currently with functional limitations due to the deficits listed below (see PT Problem List). Pt will benefit from skilled PT to increase their independence and safety with mobility to allow discharge to the venue listed below.  Pt reports SOB at rest in bed with RR 25-35 and SPO2 100% on 2L O2 after transfer.  Pt states she lives with her grandmother and had been using RW briefly prior to admission, and pt is agreeable to HHPT upon d/c.     Follow Up Recommendations Home health PT;Supervision/Assistance - 24 hour    Equipment Recommendations  None recommended by PT    Recommendations for Other Services       Precautions / Restrictions Precautions Precautions: Fall Precaution Comments: monitor HR      Mobility  Bed Mobility Overal bed mobility: Needs Assistance Bed Mobility: Supine to Sit     Supine to sit: Min assist;HOB elevated     General bed mobility comments: assist for R LE over EOB (exiting toward left side of bed), pt required elevated HOB and rail to assist upper body  Transfers Overall transfer level: Needs assistance Equipment used: Rolling walker (2 wheeled) Transfers: Sit to/from [employer] Sit to Stand: Min assist Stand pivot transfers: Min guard       General transfer comment: slight assist to rise and steady, RR increased to 35 with mobility during session however SpO2 100% on 2L O2 [place], pt reports severe dyspnea (was asking nurse for  oxygen mask); HR up to 129 bpm  Ambulation/Gait                Stairs            Wheelchair Mobility    Modified Rankin (Stroke Patients Only)       Balance Overall balance assessment: Needs assistance         Standing balance support: Bilateral upper extremity supported Standing balance-Leahy Scale: Poor                               Pertinent Vitals/Pain Pain Assessment: No/denies pain    Home Living Family/patient expects to be discharged to:: Private residence Living Arrangements: Other relatives (grandmother)     Home Access: Stairs to enter Entrance Stairs-Rails: Right Entrance Stairs-Number of Steps: 3 Home Layout: One level Home Equipment: Environmental consultant - 2 wheels      Prior Function Level of Independence: Independent         Comments: pt reports dyspnea with activity at baseline     Hand Dominance        Extremity/Trunk Assessment        Lower Extremity Assessment Lower Extremity Assessment: Generalized weakness    Cervical / Trunk Assessment Cervical / Trunk Assessment:  (body habitus limiting)  Communication   Communication: No difficulties  Cognition Arousal/Alertness: Awake/alert Behavior During Therapy: Flat affect Overall Cognitive Status: Within Functional Limits for tasks assessed  General Comments      Exercises     Assessment/Plan    PT Assessment Patient needs continued PT services  PT Problem List Decreased strength;Decreased mobility;Decreased activity tolerance;Decreased balance;Decreased knowledge of use of DME;Cardiopulmonary status limiting activity;Obesity       PT Treatment Interventions DME instruction;Therapeutic exercise;Stair training;Functional mobility training;Therapeutic activities;Patient/family education;Balance training;Gait training    PT Goals (Current goals can be found in the Care Plan section)  Acute Rehab PT  Goals PT Goal Formulation: With patient Time For Goal Achievement: 04/16/20 Potential to Achieve Goals: Good    Frequency Min 3X/week   Barriers to discharge        Co-evaluation               AM-PAC PT "6 Clicks" Mobility  Outcome Measure Help needed turning from your back to your side while in a flat bed without using bedrails?: A Lot Help needed moving from lying on your back to sitting on the side of a flat bed without using bedrails?: A Lot Help needed moving to and from a bed to a chair (including a wheelchair)?: A Little Help needed standing up from a chair using your arms (e.g., wheelchair or bedside chair)?: A Little Help needed to walk in hospital room?: A Little Help needed climbing 3-5 steps with a railing? : A Lot 6 Click Score: 15    End of Session Equipment Utilized During Treatment: Gait belt Activity Tolerance: Other (comment) (dyspnea limiting) Patient left: in chair;with call bell/phone within reach;with nursing/sitter in room Nurse Communication: Mobility status PT Visit Diagnosis: Difficulty in walking, not elsewhere classified (R26.2)    Time: 0277-4128 PT Time Calculation (min) (ACUTE ONLY): 22 min   Charges:   PT Evaluation $PT Eval Low Complexity: 1 Low        Kati PT, DPT Acute Rehabilitation Services Pager: [phone] Office: [phone]  Maida Sale E 04/08/2020, 1:00 PM

## 2020-04-09 LAB — COMPREHENSIVE METABOLIC PANEL
ALT: 38 U/L (ref 0–44)
AST: 44 U/L — ABNORMAL HIGH (ref 15–41)
Albumin: 2.2 g/dL — ABNORMAL LOW (ref 3.5–5.0)
Alkaline Phosphatase: 98 U/L (ref 38–126)
Anion gap: 7 (ref 5–15)
BUN: 22 mg/dL — ABNORMAL HIGH (ref 6–20)
CO2: 22 mmol/L (ref 22–32)
Calcium: 8.1 mg/dL — ABNORMAL LOW (ref 8.9–10.3)
Chloride: 107 mmol/L (ref 98–111)
Creatinine, Ser: 0.64 mg/dL (ref 0.44–1.00)
GFR calc Af Amer: >60 mL/min (ref >60)
GFR calc non Af Amer: >60 mL/min (ref >60)
Glucose, Bld: 135 mg/dL — ABNORMAL HIGH (ref 70–99)
Potassium: 4.4 mmol/L (ref 3.5–5.1)
Sodium: 136 mmol/L (ref 135–145)
Total Bilirubin: 1 mg/dL (ref 0.3–1.2)
Total Protein: 5.5 g/dL — ABNORMAL LOW (ref 6.5–8.1)

## 2020-04-09 LAB — CBC
HCT: 40.7 % (ref 36.0–46.0)
Hemoglobin: 12.4 g/dL (ref 12.0–15.0)
MCH: 25.8 pg — ABNORMAL LOW (ref 26.0–34.0)
MCHC: 30.5 g/dL (ref 30.0–36.0)
MCV: 84.8 fL (ref 80.0–100.0)
Platelets: 408 10*3/uL — ABNORMAL HIGH (ref 150–400)
RBC: 4.8 MIL/uL (ref 3.87–5.11)
RDW: 15.9 % — ABNORMAL HIGH (ref 11.5–15.5)
WBC: 25.8 10*3/uL — ABNORMAL HIGH (ref 4.0–10.5)
nRBC: 0.1 % (ref 0.0–0.2)

## 2020-04-09 LAB — GLUCOSE, CAPILLARY
Glucose-Capillary: 128 mg/dL — ABNORMAL HIGH (ref 70–99)
Glucose-Capillary: 149 mg/dL — ABNORMAL HIGH (ref 70–99)
Glucose-Capillary: 112 mg/dL — ABNORMAL HIGH (ref 70–99)
Glucose-Capillary: 125 mg/dL — ABNORMAL HIGH (ref 70–99)

## 2020-04-09 MED ORDER — VASOPRESSIN 20 UNIT/ML IV SOLN: 0.0300 [IU]/min | INTRAVENOUS | Status: DC

## 2020-04-09 MED ORDER — NOREPINEPHRINE 16 MG/250ML-% IV SOLN: 0.0000 ug/min | INTRAVENOUS | Status: DC

## 2020-04-09 MED ORDER — SODIUM CHLORIDE 0.9% FLUSH: 10.0000 mL | INTRAVENOUS | Status: DC | PRN

## 2020-04-09 MED ORDER — SODIUM CHLORIDE 0.9% FLUSH
10.0000 mL | Freq: Two times a day (BID) | INTRAVENOUS | Status: DC
  Administered 2020-04-10 – 2020-04-18 (×12): 10 mL

## 2020-04-09 NOTE — Progress Notes (Signed)
Referring Physician(s): Sherrie GeorgeJennings, Willard (CCS)  Supervising Physician: Malachy MoanMcCullough, Heath  [facility]  Chief Complaint: None  Subjective:  Acute calculus cholecystitis s/p percutaneous cholecystostomy tube placement in IR 04/08/2020 by Dr. Archer AsaMcCullough. Patient awake and alert sitting in bed with no complaints at this time. Accompanied by mother at bedside. Cholecystostomy tube site c/d/i.   Allergies: Patient has no known allergies.  Medications: Prior to Admission medications   Medication Sig Start Date End Date Taking? Authorizing Provider  levothyroxine (SYNTHROID) 125 MCG tablet Take 125 mcg by mouth daily before breakfast.   Yes [provider]     Vital Signs: BP (!) 128/47   Pulse (!) 102   Temp 97.9 F (36.6 C) (Oral)   Resp (!) 26   Ht 5\' 2"  (1.575 m)   Wt (!) 328 lb 11.3 oz (149.1 kg)   SpO2 96%   BMI 60.12 kg/m   Physical Exam Vitals and nursing note reviewed.  Constitutional:      General: She is not in acute distress. Pulmonary:     Effort: Pulmonary effort is normal. No respiratory distress.  Abdominal:     Comments: Cholecystostomy tube site without tenderness, erythema, drainage, or active bleeding; approximately 20-25 cc thick yellow bile in gravity bag.  Skin:    General: Skin is warm and dry.  Neurological:     Mental Status: She is alert.     Imaging: NM Hepatobiliary Liver Func  Result Date: 04/06/2020 CLINICAL DATA:  Abdominal pain. EXAM: NUCLEAR MEDICINE HEPATOBILIARY IMAGING TECHNIQUE: Sequential images of the abdomen were obtained out to 60 minutes following intravenous administration of radiopharmaceutical. RADIOPHARMACEUTICALS:  5.37 mCi Tc-5212m  Choletec IV COMPARISON:  April 03, 2020.  April 02, 2020. FINDINGS: Prompt uptake and biliary excretion of activity by the liver is seen. However, there is no definite evidence of uptake within the gallbladder, even after the administration of 3 mg of morphine  intravenously. Biliary activity passes into small bowel, consistent with patent common bile duct. IMPRESSION: No definite evidence of uptake is noted within the gallbladder lumen, even after the administration of morphine intravenously. This is concerning for possible cystic duct obstruction and acute cholecystitis. Electronically Signed   By: Lupita RaiderJames  Green Jr M.D.   On: 04/06/2020 12:12   IR Perc Cholecystostomy  Result Date: 04/08/2020 INDICATION: 39 year old morbidly obese female with acute gallstone pancreatitis an additional clinical concerns for acute calculus cholecystitis given nonvisualization of gallbladder on HIDA scan. Of note, the gallbladder is not distended but is contracted and completely filled with stones. She is not currently a surgical candidate and presents for attempted percutaneous cholecystostomy tube placement. EXAM: IR PARACENTESIS; CHOLECYSTOSTOMY MEDICATIONS: In patient currently receiving intravenous meropenem. No additional antibiotic prophylaxis was administered. ANESTHESIA/SEDATION: Moderate (conscious) sedation was employed during this procedure. A total of Versed 8 mg and Fentanyl 200 mcg was administered intravenously. Moderate Sedation Time: 54 minutes. The patient's level of consciousness and vital signs were monitored continuously by radiology nursing throughout the procedure under my direct supervision. FLUOROSCOPY TIME:  Fluoroscopy Time: 2 minutes 42 seconds (215 mGy). COMPLICATIONS: None immediate. PROCEDURE: Informed written consent was obtained from the patient after a thorough discussion of the procedural risks, benefits and alternatives. All questions were addressed. Maximal Sterile Barrier Technique was utilized including caps, mask, sterile gowns, sterile gloves, sterile drape, hand hygiene and skin antiseptic. A timeout was performed prior to the initiation of the procedure. The right upper quadrant was interrogated with ultrasound. There has  been a significant  increase in ascites compared to prior CT imaging. The liver is floating in ascites. Proceeding with placement of a percutaneous cholecystostomy tube in this setting is not ideal given the increased risk for bleeding and bile leak. Therefore, the decision was made to proceed with paracentesis to drain the fluid around the liver. 1% lidocaine with epinephrine was used for local anesthesia. Following this, a 6 Fr Safe-T-Centesis catheter was introduced. An ultrasound image was saved for documentation purposes. The paracentesis was performed. The catheter was removed and a dressing was applied. The patient tolerated the procedure well without immediate post procedural complication. Approximately 4800 mL of bilious ascites was successfully aspirated. Repeat ultrasound imaging demonstrates that the liver is again opposed to the abdominal wall. Therefore, a second skin entry site was selected. Local anesthesia was again attained by infiltration with 1% lidocaine. Under real-time ultrasound guidance, a 21 gauge Accustick needle was advanced along a short transhepatic course and into the stone filled gallbladder. A gentle hand injection of contrast material performed through the needle opacifies the stone filled gallbladder. There are innumerous filling defects throughout the gallbladder lumen. A 0.018 wire was successfully coiled in the gallbladder fundus. The needle was exchanged for the Accustick transitional sheath which was advanced over the wire and into the gallbladder lumen. The 0.018 wire was then exchanged for a 0.035 Amplatz wire. The skin and transhepatic tract were dilated to 10 [place] and a Cook 10.2 [place] all-purpose drainage catheter was advanced over the wire and formed in the gallbladder fundus. Gentle contrast injection confirms that the tube is within the gallbladder. No evidence of filling of the cystic duct. The catheter was connected to gravity bag drainage and secured to the skin with 0 Prolene  suture. IMPRESSION: 1. Significant interval increase in ascites compared to prior CT and MRI imaging. Paracentesis was required prior to cholecystostomy tube places mint. 2. Successful paracentesis yielding 4.8 L of bilious appearing ascites. 3. Successful placement of transhepatic percutaneous cholecystostomy tube for a diagnosis of acute calculus cholecystitis and gallstone pancreatitis. Signed, Sterling Big, MD, RPVI [facility] Electronically Signed   By: Malachy Moan M.D.   On: 04/08/2020 16:13   DG CHEST PORT 1 VIEW  Result Date: 04/07/2020 CLINICAL DATA:  Shortness of breath, acute pancreatitis EXAM: PORTABLE CHEST 1 VIEW COMPARISON:  Portable exam 1528 hours compared to 04/06/2020 FINDINGS: Rotated to the LEFT. Normal heart size and mediastinal contours. Significantly decreased lung volumes with bibasilar atelectasis versus infiltrate. Upper lungs clear. No pleural effusion or pneumothorax. Bones unremarkable. IMPRESSION: Significantly decreased lung volumes with bibasilar atelectasis versus infiltrate. Electronically Signed   By: Ulyses Southward M.D.   On: 04/07/2020 15:54   DG CHEST PORT 1 VIEW  Result Date: 04/06/2020 CLINICAL DATA:  Shortness of breath, tachycardia EXAM: PORTABLE CHEST 1 VIEW COMPARISON:  04/04/2020 FINDINGS: Cardiomegaly. Low lung volumes with vascular congestion and bibasilar atelectasis. Suspect small layering effusions. No acute bony abnormality. IMPRESSION: Small bilateral effusions with bibasilar opacities, likely atelectasis. Cardiomegaly, vascular congestion. Electronically Signed   By: Charlett Nose M.D.   On: 04/06/2020 23:41   IR Paracentesis  Result Date: 04/08/2020 INDICATION: 39 year old morbidly obese female with acute gallstone pancreatitis an additional clinical concerns for acute calculus cholecystitis given nonvisualization of gallbladder on HIDA scan. Of note, the gallbladder is not  distended but is contracted and completely filled with stones. She is not currently a surgical candidate and presents for attempted percutaneous cholecystostomy tube  placement. EXAM: IR PARACENTESIS; CHOLECYSTOSTOMY MEDICATIONS: In patient currently receiving intravenous meropenem. No additional antibiotic prophylaxis was administered. ANESTHESIA/SEDATION: Moderate (conscious) sedation was employed during this procedure. A total of Versed 8 mg and Fentanyl 200 mcg was administered intravenously. Moderate Sedation Time: 54 minutes. The patient's level of consciousness and vital signs were monitored continuously by radiology nursing throughout the procedure under my direct supervision. FLUOROSCOPY TIME:  Fluoroscopy Time: 2 minutes 42 seconds (215 mGy). COMPLICATIONS: None immediate. PROCEDURE: Informed written consent was obtained from the patient after a thorough discussion of the procedural risks, benefits and alternatives. All questions were addressed. Maximal Sterile Barrier Technique was utilized including caps, mask, sterile gowns, sterile gloves, sterile drape, hand hygiene and skin antiseptic. A timeout was performed prior to the initiation of the procedure. The right upper quadrant was interrogated with ultrasound. There has been a significant increase in ascites compared to prior CT imaging. The liver is floating in ascites. Proceeding with placement of a percutaneous cholecystostomy tube in this setting is not ideal given the increased risk for bleeding and bile leak. Therefore, the decision was made to proceed with paracentesis to drain the fluid around the liver. 1% lidocaine with epinephrine was used for local anesthesia. Following this, a 6 Fr Safe-T-Centesis catheter was introduced. An ultrasound image was saved for documentation purposes. The paracentesis was performed. The catheter was removed and a dressing was applied. The patient tolerated the procedure well without immediate post procedural  complication. Approximately 4800 mL of bilious ascites was successfully aspirated. Repeat ultrasound imaging demonstrates that the liver is again opposed to the abdominal wall. Therefore, a second skin entry site was selected. Local anesthesia was again attained by infiltration with 1% lidocaine. Under real-time ultrasound guidance, a 21 gauge Accustick needle was advanced along a short transhepatic course and into the stone filled gallbladder. A gentle hand injection of contrast material performed through the needle opacifies the stone filled gallbladder. There are innumerous filling defects throughout the gallbladder lumen. A 0.018 wire was successfully coiled in the gallbladder fundus. The needle was exchanged for the Accustick transitional sheath which was advanced over the wire and into the gallbladder lumen. The 0.018 wire was then exchanged for a 0.035 Amplatz wire. The skin and transhepatic tract were dilated to 10 [place] and a Cook 10.2 [place] all-purpose drainage catheter was advanced over the wire and formed in the gallbladder fundus. Gentle contrast injection confirms that the tube is within the gallbladder. No evidence of filling of the cystic duct. The catheter was connected to gravity bag drainage and secured to the skin with 0 Prolene suture. IMPRESSION: 1. Significant interval increase in ascites compared to prior CT and MRI imaging. Paracentesis was required prior to cholecystostomy tube places mint. 2. Successful paracentesis yielding 4.8 L of bilious appearing ascites. 3. Successful placement of transhepatic percutaneous cholecystostomy tube for a diagnosis of acute calculus cholecystitis and gallstone pancreatitis. Signed, Sterling Big, MD, RPVI [facility] Radiology Electronically Signed   By: Malachy Moan M.D.   On: 04/08/2020 16:13    Labs:  CBC: Recent Labs    04/06/20 0340 04/07/20 0258 04/08/20 0132 04/09/20 0149  WBC  28.2* 27.5* 28.1* 25.8*  HGB 13.3 13.7 13.1 12.4  HCT 44.3 44.5 42.9 40.7  PLT 402* 433* 424* 408*    COAGS: Recent Labs    04/02/20 1847 04/08/20 0132  INR  --  1.2  APTT 26  --     BMP: Recent Labs  04/06/20 0340 04/07/20 0258 04/08/20 0132 04/09/20 0149  NA 138 133* 135 136  K 4.4 4.5 4.7 4.4  CL 106 104 104 107  CO2 19* 21* 21* 22  GLUCOSE 137* 166* 125* 135*  BUN 20 19 23* 22*  CALCIUM 7.9* 8.0* 8.1* 8.1*  CREATININE 0.79 0.65 0.69 0.64  GFRNONAA >60 >60 >60 >60  GFRAA >60 >60 >60 >60    LIVER FUNCTION TESTS: Recent Labs    04/04/20 0318 04/05/20 0658 04/06/20 0340 04/09/20 0149  BILITOT 2.0* 1.8* 1.2 1.0  AST 20 18 17  44*  ALT 59* 35 29 38  ALKPHOS 89 84 83 98  PROT 5.6* 5.8* 6.2* 5.5*  ALBUMIN 2.3* 2.2* 2.4* 2.2*    Assessment and Plan:  Acute calculus cholecystitis s/p percutaneous cholecystostomy tube placement in IR 04/08/2020 by Dr. 04/10/2020. Cholecystostomy tube stable with approximately 20-25 cc thick yellow bile in gravity bag (additional 50 cc output from drain in past 24 hours per chart). Continue current drain management- continue with Qshift flushes/monitor of output. Plan for repeat cholangiogram/possible exchange in 6-8 weeks if cholecystectomy does not occur in interim (order placed to facilitate this). Further plans per [facility]- appreciate and agree with management. IR to follow.   Electronically Signed: Archer Asa, PA-C 04/09/2020, 11:08 AM   I spent a total of 25 Minutes at the the patient's bedside AND on the patient's hospital floor or unit, greater than 50% of which was counseling/coordinating care for acute calculus cholecystitis s/p cholecystostomy tube placement.

## 2020-04-09 NOTE — Progress Notes (Addendum)
PROGRESS NOTE    Amber May  PPJ:[id number] DOB: 1981-03-19 DOA: 04/02/2020 PCP: Patient, No Pcp Per   Brief Narrative:  39 year old woman PMH hypothyroidism, morbid obesity presented with abdominal pain and fever.  Admitted for severe acute pancreatitis, SIRS.  Treated with empiric antibiotics, aggressive IV fluids, seen by gastroenterology and general surgery.  Further work-up concerning for acute cholecystitis.  Condition slowly improving.  Cholecystectomy unable to be performed - IR performed cholecystostomy tube placement on 04/08/2020 with 4 L ascites drawn at time of procedure.  Assessment & Plan:   Principal Problem:   Acute pancreatitis Active Problems:   SIRS (systemic inflammatory response syndrome) (HCC)   AKI (acute kidney injury) (HCC)   Hypothyroidism   Hyperglycemia   Elevated liver enzymes   Acute respiratory failure with hypoxemia (HCC)   Acute cholecystitis   Hypocalcemia   Hypophosphatemia   Morbid obesity with BMI of 50.0-59.9, adult (HCC)   Cholelithiasis   Epigastric abdominal pain  Acute pancreatitis with severe clinical features including fever, acute hypoxic respiratory failure, tachypnea, lactic acidosis, leukocytosis, acute kidney injury, ascites, elevated LFTs.  - Presumed gallstone pancreatitis.  Triglycerides without significant elevation.  No alcohol use.  Elevated LFTs on admission consistent with gallstone pancreatitis. - Procalcitonin downtrending, leukocytosis stable, remains afebrile  - Continue empiric cefepime   - Tolerated paracentesis and cholecystostomy drain placement on 04/08/2020 - 4.8 bilious ascites was drawn off and 12F drain was placed - Appreciate insight and recommendations from surgery and radiology -advance diet per surgery -Per IR - plan for repeat cholangiogram/possible exchange in 6-8 weeks if cholecystectomy does not occur in interim  Acute hypoxic respiratory failure, multifactorial  -Likely primarily secondary to  acute pancreatitis/cholecystitis and polypharmacy -Likely complicated by morbid obesity and probable chronic obesity hypoventilation syndrome. SpO2: 96 % O2 Flow Rate (L/min): 1 L/min  Acute cholecystitis, questionable.   - HIDA scan was abnormal. - PT cefepime, pending PERC drain today with IR  Hypocalcemia, hypophosphatemia - Likely in the setting of poor p.o. intake - Check BMP, magnesium and phosphorus in a.m.  Acute kidney injury secondary to poor p.o. intake and acute pancreatitis, resolved - Continue to increase p.o. intake as tolerated post procedure - IVF now stopped Lab Results  Component Value Date   CREATININE 0.64 04/09/2020   CREATININE 0.69 04/08/2020   CREATININE 0.65 04/07/2020   Hyperglycemia - Likely secondary to acute pancreatitis.  Hemoglobin A1c 5.4. - Overall mild in nature.  Continue sliding scale insulin.  Hepatomegaly and hepatic steatosis.   - Likely related to morbid obesity/diet - Follow-up as an outpatient with gastroenterology.  Abnormal low-density in the lower uterine segment/cervix spanning 18 mm, nonspecific. Recommend nonemergent pelvic ultrasound for characterization on an elective outpatient basis.  Morbid obesity. Body mass index is 62.1 kg/m. --Dietitian consult   DVT prophylaxis: SCDs periprocedure Code Status: Full Family Communication: At bedside  Status is: Inpatient  Dispo: The patient is from: Home              Anticipated d/c is to: Home              Anticipated d/c date is: 48-72h              Patient currently NOT medically stable for discharge due to ongoing need for close monitoring, procedure and surgical evaluation.   Consultants:   Gen surgery, IR  Procedures:   - Paracentesis yielding 4.8L bilious ascites.   - Placement of transhepatic 12F perc cholecystostomy tube.  Antimicrobials:  Cefepime   Subjective: No acute issues or events overnight, family at bedside, denies nausea, vomiting,  diarrhea, constipation, headache, fevers and chills.  Objective: Vitals:   04/09/20 0451 04/09/20 0800 04/09/20 0900 04/09/20 1000  BP:  (!) 141/83 (!) 160/67 (!) 128/47  Pulse:  (!) 105 (!) 107 (!) 102  Resp:  (!) 30 (!) 23 (!) 26  Temp:  97.9 F (36.6 C)    TempSrc:  Oral    SpO2:  93% 94% 96%  Weight: (!) 149.1 kg     Height:        Intake/Output Summary (Last 24 hours) at 04/09/2020 1202 Last data filed at 04/09/2020 0800 Gross per 24 hour  Intake 538.2 ml  Output 250 ml  Net 288.2 ml   Filed Weights   04/07/20 0500 04/08/20 0326 04/09/20 0451  Weight: (!) 154 kg (!) 153.5 kg (!) 149.1 kg    Examination:  General:  Pleasantly resting in bed, No acute distress. HEENT:  Normocephalic atraumatic.  Sclerae nonicteric, noninjected.  Extraocular movements intact bilaterally. Neck:  Without mass or deformity.  Trachea is midline. Lungs:  Clear to auscultate bilaterally without rhonchi, wheeze, or rales. Heart:  Regular rate and rhythm.  Without murmurs, rubs, or gallops. Abdomen:  Soft, obese, right upper quadrant drain in place draining amber fluid, bandage clean dry intact. Extremities: Without cyanosis, clubbing, edema, or obvious deformity. Vascular:  Dorsalis pedis and posterior tibial pulses palpable bilaterally. Skin:  Warm and dry, no erythema, no ulcerations.   Data Reviewed: I have personally reviewed following labs and imaging studies  CBC: Recent Labs  Lab 04/03/20 0709 04/03/20 0709 04/03/20 2231 04/03/20 2231 04/04/20 0318 04/04/20 1229 04/05/20 0405 04/06/20 0340 04/07/20 0258 04/08/20 0132 04/09/20 0149  WBC 39.3*   < > 38.3*   < > 34.6*   < > 29.8* 28.2* 27.5* 28.1* 25.8*  NEUTROABS 34.2*  --  33.8*  --  30.0*  --  25.6* 22.2*  --   --   --   HGB 16.0*   < > 15.5*   < > 15.0   < > 14.3 13.3 13.7 13.1 12.4  HCT 50.5*   < > 48.7*   < > 48.1*   < > 46.0 44.3 44.5 42.9 40.7  MCV 83.1   < > 82.5   < > 82.8   < > 82.7 86.9 83.6 84.6 84.8  PLT 333   <  > 289   < > 305   < > 347 402* 433* 424* 408*   < > = values in this interval not displayed.   Basic Metabolic Panel: Recent Labs  Lab 04/03/20 0709 04/03/20 0709 04/04/20 0318 04/04/20 0318 04/05/20 1610 04/06/20 0340 04/07/20 0258 04/08/20 0132 04/09/20 0149  NA 135   < > 133*   < > 139 138 133* 135 136  K 4.1   < > 4.0   < > 4.3 4.4 4.5 4.7 4.4  CL 106   < > 103   < > 105 106 104 104 107  CO2 18*   < > 22   < > 22 19* 21* 21* 22  GLUCOSE 172*   < > 149*   < > 163* 137* 166* 125* 135*  BUN 21*   < > 16   < > 23* 22*  CREATININE 1.22*   < > 1.01*   < > 1.02* 0.79 0.65 0.69 0.64  CALCIUM 6.4*   < >  6.7*   < > 7.4* 7.9* 8.0* 8.1* 8.1*  MG 1.5*  --  2.0  --  2.2 2.4  --  2.7*  --   PHOS  --   --  2.1*  --  1.8* 2.0*  --  3.0  --    < > = values in this interval not displayed.   GFR: Estimated Creatinine Clearance: 131 mL/min (by C-G formula based on SCr of 0.64 mg/dL). Liver Function Tests: Recent Labs  Lab 04/03/20 0709 04/04/20 0318 04/05/20 0658 04/06/20 0340 04/09/20 0149  AST 33 44*  ALT 101* 59* 35 29 38  ALKPHOS 96 89 84 83 98  BILITOT 2.4* 2.0* 1.8* 1.2 1.0  PROT 5.8* 5.6* 5.8* 6.2* 5.5*  ALBUMIN 2.8* 2.3* 2.2* 2.4* 2.2*   Recent Labs  Lab 04/02/20 1847 04/04/20 0725 04/05/20 0658  LIPASE 154* 44 33   No results for input(s): AMMONIA in the last 168 hours. Coagulation Profile: Recent Labs  Lab 04/08/20 0132  INR 1.2   Cardiac Enzymes: No results for input(s): CKTOTAL, CKMB, CKMBINDEX, TROPONINI in the last 168 hours. BNP (last 3 results) No results for input(s): PROBNP in the last 8760 hours. HbA1C: No results for input(s): HGBA1C in the last 72 hours. CBG: Recent Labs  Lab 04/08/20 1146 04/08/20 1802 04/08/20 2354 04/09/20 0553 04/09/20 1142  GLUCAP 145* 118* 125* 128* 149*   Lipid Profile: No results for input(s): CHOL, HDL, LDLCALC, TRIG, CHOLHDL, LDLDIRECT in the last 72 hours. Thyroid Function Tests: No results  for input(s): TSH, T4TOTAL, FREET4, T3FREE, THYROIDAB in the last 72 hours. Anemia Panel: No results for input(s): VITAMINB12, FOLATE, FERRITIN, TIBC, IRON, RETICCTPCT in the last 72 hours. Sepsis Labs: Recent Labs  Lab 04/03/20 0709 04/03/20 5409 04/03/20 1337 04/03/20 1752 04/04/20 0318 04/05/20 0658 04/07/20 0554  PROCALCITON 1.45  --   --   --  1.16 0.96  --   LATICACIDVEN 2.2*   < > 2.2* 1.9 1.9  --  2.0*   < > = values in this interval not displayed.    Recent Results (from the past 240 hour(s))  Blood Culture (routine x 2)     Status: None   Collection Time: 04/02/20  6:45 PM   Specimen: BLOOD  Result Value Ref Range Status   Specimen Description   Final    BLOOD LEFT ANTECUBITAL Performed at [facility], [address] [place], [place] [address]    Special Requests   Final    BOTTLES DRAWN AEROBIC AND ANAEROBIC Blood Culture results may not be optimal due to an excessive volume of blood received in culture bottles Performed at [facility], [address] [place], [place] [address]    Culture   Final    NO GROWTH 5 DAYS Performed at [facility], [address], [place], [place] [address]    Report Status 04/07/2020 FINAL  Final  Urine culture     Status: Abnormal   Collection Time: 04/02/20  6:47 PM   Specimen: In/Out Cath Urine  Result Value Ref Range Status   Specimen Description   Final    IN/OUT CATH URINE Performed at [facility], [address] [place], [place] [address]    Special Requests   Final    NONE Performed at [facility], [address] [place], [place] [address]    Culture >=100,000 COLONIES/mL ESCHERICHIA COLI (A)  Final   Report Status 04/05/2020 FINAL  Final   Organism ID, Bacteria ESCHERICHIA COLI (A)  Final      Susceptibility   Escherichia coli - MIC*    AMPICILLIN 8 SENSITIVE Sensitive     CEFAZOLIN <=4 SENSITIVE Sensitive     CEFTRIAXONE <=0.25 SENSITIVE Sensitive      CIPROFLOXACIN <=0.25 SENSITIVE Sensitive     GENTAMICIN <=1 SENSITIVE Sensitive     IMIPENEM <=0.25 SENSITIVE Sensitive     NITROFURANTOIN <=16 SENSITIVE Sensitive     TRIMETH/SULFA <=20 SENSITIVE Sensitive     AMPICILLIN/SULBACTAM 4 SENSITIVE Sensitive     PIP/TAZO <=4 SENSITIVE Sensitive     * >=100,000 COLONIES/mL ESCHERICHIA COLI  Blood Culture (routine x 2)     Status: None   Collection Time: 04/02/20  7:00 PM   Specimen: BLOOD  Result Value Ref Range Status   Specimen Description   Final    BLOOD BLOOD RIGHT HAND Performed at [facility] [place], [address] [place], [place] [address]    Special Requests   Final    BOTTLES DRAWN AEROBIC ONLY Blood Culture results may not be optimal due to an inadequate volume of blood received in culture bottles Performed at [facility] [place] [facility], [address] [place], [place] [address]    Culture   Final    NO GROWTH 5 DAYS Performed at [facility], [address] [place], [place] [address]    Report Status 04/07/2020 FINAL  Final  SARS Coronavirus 2 by RT PCR (hospital order, performed in [facility] hospital lab) Nasopharyngeal Nasopharyngeal Swab     Status: None   Collection Time: 04/02/20 11:13 PM   Specimen: Nasopharyngeal Swab  Result Value Ref Range Status   SARS Coronavirus 2 NEGATIVE NEGATIVE Final    Comment: (NOTE) SARS-CoV-2 target nucleic acids are NOT DETECTED.  The SARS-CoV-2 RNA is generally detectable in upper and lower respiratory specimens during the acute phase of infection. The lowest concentration of SARS-CoV-2 viral copies this assay can detect is 250 copies / mL. A negative result does not preclude SARS-CoV-2 infection and should not be used as the sole basis for treatment or other patient management decisions.  A negative result may occur with improper specimen collection / handling, submission of specimen other than nasopharyngeal swab, presence of viral mutation(s) within the areas  targeted by this assay, and inadequate number of viral copies (<250 copies / mL). A negative result must be combined with clinical observations, patient history, and epidemiological information.  Fact Sheet for Patients:   BoilerBrush.com.cyhttps://www.fda.gov/media/136312/download  Fact Sheet for Healthcare Providers: https://pope.com/https://www.fda.gov/media/136313/download  This test is not yet approved or  cleared by the [place] FDA and has been authorized for detection and/or diagnosis of SARS-CoV-2 by FDA under an Emergency Use Authorization (EUA).  This EUA will remain in effect (meaning this test can be used) for the duration of the COVID-19 declaration under Section 564(b)(1) of the Act, 21 U.S.C. section 360bbb-3(b)(1), unless the authorization is terminated or revoked sooner.  Performed at [facility] [place], [address] [place], [place] [address]   MRSA PCR Screening     Status: None   Collection Time: 04/04/20  9:08 AM   Specimen: Nasal Mucosa; Nasopharyngeal  Result Value Ref Range Status   MRSA by PCR NEGATIVE NEGATIVE Final    Comment:        The GeneXpert MRSA Assay (FDA approved for NASAL specimens only), is one  component of a comprehensive MRSA colonization surveillance program. It is not intended to diagnose MRSA infection nor to guide or monitor treatment for MRSA infections. Performed at [facility], [address] [place], [place] [address]   Body fluid culture     Status: None (Preliminary result)   Collection Time: 04/08/20  3:18 PM   Specimen: Peritoneal Cavity; Body Fluid  Result Value Ref Range Status   Specimen Description   Final    PERITONEAL CAVITY Performed at [facility], [address] [place], [place] [address]    Special Requests   Final    Normal Performed at [facility], [address] [place], [place] [address]    Gram Stain NO WBC SEEN NO ORGANISMS SEEN   Final   Culture   Final      NO GROWTH < 24 HOURS Performed at [facility], [address] [place], [place] [address]    Report Status PENDING  Incomplete         Radiology Studies: IR Perc Cholecystostomy  Result Date: 04/08/2020 INDICATION: 39 year old morbidly obese female with acute gallstone pancreatitis an additional clinical concerns for acute calculus cholecystitis given nonvisualization of gallbladder on HIDA scan. Of note, the gallbladder is not distended but is contracted and completely filled with stones. She is not currently a surgical candidate and presents for attempted percutaneous cholecystostomy tube placement. EXAM: IR PARACENTESIS; CHOLECYSTOSTOMY MEDICATIONS: In patient currently receiving intravenous meropenem. No additional antibiotic prophylaxis was administered. ANESTHESIA/SEDATION: Moderate (conscious) sedation was employed during this procedure. A total of Versed 8 mg and Fentanyl 200 mcg was administered intravenously. Moderate Sedation Time: 54 minutes. The patient's level of consciousness and vital signs were monitored continuously by radiology nursing throughout the procedure under my direct supervision. FLUOROSCOPY TIME:  Fluoroscopy Time: 2 minutes 42 seconds (215 mGy). COMPLICATIONS: None immediate. PROCEDURE: Informed written consent was obtained from the patient after a thorough discussion of the procedural risks, benefits and alternatives. All questions were addressed. Maximal Sterile Barrier Technique was utilized including caps, mask, sterile gowns, sterile gloves, sterile drape, hand hygiene and skin antiseptic. A timeout was performed prior to the initiation of the procedure. The right upper quadrant was interrogated with ultrasound. There has been a significant increase in ascites compared to prior CT imaging. The liver is floating in ascites. Proceeding with placement of a percutaneous cholecystostomy tube in this setting is not ideal given the increased risk for bleeding and  bile leak. Therefore, the decision was made to proceed with paracentesis to drain the fluid around the liver. 1% lidocaine with epinephrine was used for local anesthesia. Following this, a 6 Fr Safe-T-Centesis catheter was introduced. An ultrasound image was saved for documentation purposes. The paracentesis was performed. The catheter was removed and a dressing was applied. The patient tolerated the procedure well without immediate post procedural complication. Approximately 4800 mL of bilious ascites was successfully aspirated. Repeat ultrasound imaging demonstrates that the liver is again opposed to the abdominal wall. Therefore, a second skin entry site was selected. Local anesthesia was again attained by infiltration with 1% lidocaine. Under real-time ultrasound guidance, a 21 gauge Accustick needle was advanced along a short transhepatic course and into the stone filled gallbladder. A gentle hand injection of contrast material performed through the needle opacifies the stone filled gallbladder. There are innumerous filling defects throughout the gallbladder lumen. A 0.018 wire was successfully coiled in the gallbladder fundus. The needle was exchanged for the Accustick  transitional sheath which was advanced over the wire and into the gallbladder lumen. The 0.018 wire was then exchanged for a 0.035 Amplatz wire. The skin and transhepatic tract were dilated to 10 [place] and a Cook 10.2 [place] all-purpose drainage catheter was advanced over the wire and formed in the gallbladder fundus. Gentle contrast injection confirms that the tube is within the gallbladder. No evidence of filling of the cystic duct. The catheter was connected to gravity bag drainage and secured to the skin with 0 Prolene suture. IMPRESSION: 1. Significant interval increase in ascites compared to prior CT and MRI imaging. Paracentesis was required prior to cholecystostomy tube places mint. 2. Successful paracentesis yielding 4.8 L of bilious  appearing ascites. 3. Successful placement of transhepatic percutaneous cholecystostomy tube for a diagnosis of acute calculus cholecystitis and gallstone pancreatitis. Signed, Sterling Big, MD, RPVI [facility] Radiology Electronically Signed   By: Malachy Moan M.D.   On: 04/08/2020 16:13   DG CHEST PORT 1 VIEW  Result Date: 04/07/2020 CLINICAL DATA:  Shortness of breath, acute pancreatitis EXAM: PORTABLE CHEST 1 VIEW COMPARISON:  Portable exam 1528 hours compared to 04/06/2020 FINDINGS: Rotated to the LEFT. Normal heart size and mediastinal contours. Significantly decreased lung volumes with bibasilar atelectasis versus infiltrate. Upper lungs clear. No pleural effusion or pneumothorax. Bones unremarkable. IMPRESSION: Significantly decreased lung volumes with bibasilar atelectasis versus infiltrate. Electronically Signed   By: Ulyses Southward M.D.   On: 04/07/2020 15:54   IR Paracentesis  Result Date: 04/08/2020 INDICATION: 39 year old morbidly obese female with acute gallstone pancreatitis an additional clinical concerns for acute calculus cholecystitis given nonvisualization of gallbladder on HIDA scan. Of note, the gallbladder is not distended but is contracted and completely filled with stones. She is not currently a surgical candidate and presents for attempted percutaneous cholecystostomy tube placement. EXAM: IR PARACENTESIS; CHOLECYSTOSTOMY MEDICATIONS: In patient currently receiving intravenous meropenem. No additional antibiotic prophylaxis was administered. ANESTHESIA/SEDATION: Moderate (conscious) sedation was employed during this procedure. A total of Versed 8 mg and Fentanyl 200 mcg was administered intravenously. Moderate Sedation Time: 54 minutes. The patient's level of consciousness and vital signs were monitored continuously by radiology nursing throughout the procedure under my direct supervision. FLUOROSCOPY TIME:  Fluoroscopy  Time: 2 minutes 42 seconds (215 mGy). COMPLICATIONS: None immediate. PROCEDURE: Informed written consent was obtained from the patient after a thorough discussion of the procedural risks, benefits and alternatives. All questions were addressed. Maximal Sterile Barrier Technique was utilized including caps, mask, sterile gowns, sterile gloves, sterile drape, hand hygiene and skin antiseptic. A timeout was performed prior to the initiation of the procedure. The right upper quadrant was interrogated with ultrasound. There has been a significant increase in ascites compared to prior CT imaging. The liver is floating in ascites. Proceeding with placement of a percutaneous cholecystostomy tube in this setting is not ideal given the increased risk for bleeding and bile leak. Therefore, the decision was made to proceed with paracentesis to drain the fluid around the liver. 1% lidocaine with epinephrine was used for local anesthesia. Following this, a 6 Fr Safe-T-Centesis catheter was introduced. An ultrasound image was saved for documentation purposes. The paracentesis was performed. The catheter was removed and a dressing was applied. The patient tolerated the procedure well without immediate post procedural complication. Approximately 4800 mL of bilious ascites was successfully aspirated. Repeat ultrasound imaging demonstrates that the liver is again opposed to the abdominal wall. Therefore, a second skin entry site was selected. Local  anesthesia was again attained by infiltration with 1% lidocaine. Under real-time ultrasound guidance, a 21 gauge Accustick needle was advanced along a short transhepatic course and into the stone filled gallbladder. A gentle hand injection of contrast material performed through the needle opacifies the stone filled gallbladder. There are innumerous filling defects throughout the gallbladder lumen. A 0.018 wire was successfully coiled in the gallbladder fundus. The needle was exchanged for  the Accustick transitional sheath which was advanced over the wire and into the gallbladder lumen. The 0.018 wire was then exchanged for a 0.035 Amplatz wire. The skin and transhepatic tract were dilated to 10 [place] and a Cook 10.2 [place] all-purpose drainage catheter was advanced over the wire and formed in the gallbladder fundus. Gentle contrast injection confirms that the tube is within the gallbladder. No evidence of filling of the cystic duct. The catheter was connected to gravity bag drainage and secured to the skin with 0 Prolene suture. IMPRESSION: 1. Significant interval increase in ascites compared to prior CT and MRI imaging. Paracentesis was required prior to cholecystostomy tube places mint. 2. Successful paracentesis yielding 4.8 L of bilious appearing ascites. 3. Successful placement of transhepatic percutaneous cholecystostomy tube for a diagnosis of acute calculus cholecystitis and gallstone pancreatitis. Signed, Sterling Big, MD, RPVI [facility] Radiology Electronically Signed   By: Malachy Moan M.D.   On: 04/08/2020 16:13   Scheduled Meds: . Chlorhexidine Gluconate Cloth  6 each Topical Daily  . enoxaparin (LOVENOX) injection  75 mg Subcutaneous Q24H  . insulin aspart  0-6 Units Subcutaneous Q6H  . levothyroxine  75 mcg Intravenous Daily  . mouth rinse  15 mL Mouth Rinse BID  . morphine      . sodium chloride flush  5 mL Intracatheter Q8H   Continuous Infusions: . sodium chloride Stopped (04/05/20 1702)  . ceFEPime (MAXIPIME) IV Stopped (04/09/20 0703)     LOS: 6 days    Time spent:  Azucena Fallen, DO [facility]  If 7PM-7AM, please contact night-coverage [email or web address]  04/09/2020, 12:02 PM

## 2020-04-09 NOTE — Progress Notes (Signed)
2 Days Post-Op    CC: Abdominal pain  Subjective: Patient is lying in bed respiratory rate is still elevated.  She says she feels a little bit better and she seems of tolerated clear liquids.  IR drains in place and its a green color fluid.  Current cultures are below.  I asked her about incentive spirometry she can only move 250, and her respiratory rate went up to 35 trying to do that.  She was active at home taking care of her grandmother, and doing housework prior to this admission.  Objective: Vital signs in last 24 hours: Temp:  [97.4 F (36.3 C)-99.5 F (37.5 C)] 99.5 F (37.5 C) (07/01 0400) Pulse Rate:  [96-121] 101 (07/01 0400) Resp:  [18-36] 25 (07/01 0400) BP: (94-153)/(46-120) 134/81 (07/01 0400) SpO2:  [92 %-99 %] 95 % (07/01 0400) Weight:  [149.1 kg] 149.1 kg (07/01 0451) Last BM Date:  (PTA) 240 PO 298 IV Urine 200 recorded Drain 50 TM 99.5 RR 20-30's HR 100's - Sats:  93-95% on 2 l/[place] CMP stable  WBC 25.8 Specimen Description PERITONEAL CAVITY  Performed at [facility], [address] [place], [place] [address]   Special Requests Normal  Performed at [facility], [address], [place], [place] [address]   Gram Stain NO WBC SEEN  NO ORGANISMS SEEN    Specimen Description IN/OUT CATH URINE  Performed at [facility], [address] [place], [place] [address]   Special Requests NONE  Performed at [facility], [address] [place], [place] [address]   Culture >=100,000 COLONIES/mL ESCHERICHIA COLIAbnormal   Report Status 04/05/2020 FINAL   Organism ID, Bacteria ESCHERICHIA COLIAbnormal    Blood cultures negative after 5 days  Intake/Output from previous day: 06/30 0701 - 07/01 0700 In: 538.2 [P.O.:240; IV Piggyback:298.2] Out: 250 [Urine:200; Drains:50] Intake/Output this shift: No intake/output data recorded.  General appearance: alert, cooperative and No distress but her RR is  up at rest and with IS, moving only 250 on the IS, waiting on someone to help her with it.  Resp: RR up at rest and up to 35 with IS Cardio: regular rate and rhythm GI: large, no point tenderness, drain is a yellowish green color.    Lab Results:  Recent Labs    04/08/20 0132 04/09/20 0149  WBC 28.1* 25.8*  HGB 13.1 12.4  HCT 42.9 40.7  PLT 424* 408*    BMET Recent Labs    04/08/20 0132 04/09/20 0149  NA 135 136  K 4.7 4.4  CL 104 107  CO2 21* 22  GLUCOSE 125* 135*  BUN 23* 22*  CREATININE 0.69 0.64  CALCIUM 8.1* 8.1*   PT/INR Recent Labs    04/08/20 0132  LABPROT 14.8  INR 1.2    Recent Labs  Lab 04/03/20 0709 04/04/20 0318 04/05/20 0658 04/06/20 0340 04/09/20 0149  AST 33 20 18 17  44*  ALT 101* 59* 35 29 38  ALKPHOS 96 89 84 83 98  BILITOT 2.4* 2.0* 1.8* 1.2 1.0  PROT 5.8* 5.6* 5.8* 6.2* 5.5*  ALBUMIN 2.8* 2.3* 2.2* 2.4* 2.2*     Lipase     Component Value Date/Time   LIPASE 33 04/05/2020 0658     Medications: . Chlorhexidine Gluconate Cloth  6 each Topical Daily  . enoxaparin (LOVENOX) injection  75 mg Subcutaneous Q24H  . insulin aspart  0-6 Units Subcutaneous Q6H  . levothyroxine  75 mcg  Intravenous Daily  . mouth rinse  15 mL Mouth Rinse BID  . morphine      . sodium chloride flush  5 mL Intracatheter Q8H   . sodium chloride Stopped (04/05/20 1702)  . ceFEPime (MAXIPIME) IV 2 g (04/09/20 5621)    Assessment/Plan Acute hypoxic respiratory failure -multifactorial AKI E coli UTI Hyperglycemia Hepatomegaly/hepatic steatosis Morbid obesity BMI 61.9   Acute pancreatitis, cholelithiasis,cholecystitis  -Tbili normalized and LFTs within normal limits -MRCP 6/25 negative for choledocholithiasis - WBC slowly improving, 27K this AM - HIDAwith non-vis of gallbladder - laparoscopic cholecystectomy canceled by anesthesia for pulmonary issues 6/29  - IR drain 6/30; IR paracentesis yielding 4.8 L of bilious appearing ascites - culture,  cell count,    bilirubin and cytology pending  FEN:  clear liquids/ IV fluids ID: Vancomycin 6/24-6/26; cefepime 6/26 >> day 7  - WBC 45.6>>34.6>>29.8>>28.5>>25.8 DVT: Lovenox on hold.  Plan:  Would advance her diet to fulls, work on mobilizing her, and Rx for respiratory failure.  I told her to work on her IS q1h while awake.      LOS: 6 days    Amber May 04/09/2020 Please see Amion

## 2020-04-10 DIAGNOSIS — K8043 Calculus of bile duct with acute cholecystitis with obstruction: Secondary | ICD-10-CM

## 2020-04-10 LAB — CBC
HCT: 38 % (ref 36.0–46.0)
Hemoglobin: 11.8 g/dL — ABNORMAL LOW (ref 12.0–15.0)
MCH: 26.3 pg (ref 26.0–34.0)
MCHC: 31.1 g/dL (ref 30.0–36.0)
MCV: 84.8 fL (ref 80.0–100.0)
Platelets: 364 10*3/uL (ref 150–400)
RBC: 4.48 MIL/uL (ref 3.87–5.11)
RDW: 15.8 % — ABNORMAL HIGH (ref 11.5–15.5)
WBC: 31.2 10*3/uL — ABNORMAL HIGH (ref 4.0–10.5)
nRBC: 0.2 % (ref 0.0–0.2)

## 2020-04-10 LAB — GLUCOSE, CAPILLARY
Glucose-Capillary: 120 mg/dL — ABNORMAL HIGH (ref 70–99)
Glucose-Capillary: 155 mg/dL — ABNORMAL HIGH (ref 70–99)
Glucose-Capillary: 112 mg/dL — ABNORMAL HIGH (ref 70–99)
Glucose-Capillary: 109 mg/dL — ABNORMAL HIGH (ref 70–99)

## 2020-04-10 LAB — COMPREHENSIVE METABOLIC PANEL
ALT: 38 U/L (ref 0–44)
AST: 38 U/L (ref 15–41)
Albumin: 2.1 g/dL — ABNORMAL LOW (ref 3.5–5.0)
Alkaline Phosphatase: 99 U/L (ref 38–126)
Anion gap: 10 (ref 5–15)
BUN: 15 mg/dL (ref 6–20)
CO2: 23 mmol/L (ref 22–32)
Calcium: 8.3 mg/dL — ABNORMAL LOW (ref 8.9–10.3)
Chloride: 104 mmol/L (ref 98–111)
Creatinine, Ser: 0.58 mg/dL (ref 0.44–1.00)
GFR calc Af Amer: >60 mL/min (ref >60)
GFR calc non Af Amer: >60 mL/min (ref >60)
Glucose, Bld: 128 mg/dL — ABNORMAL HIGH (ref 70–99)
Potassium: 4.6 mmol/L (ref 3.5–5.1)
Sodium: 137 mmol/L (ref 135–145)
Total Bilirubin: 0.8 mg/dL (ref 0.3–1.2)
Total Protein: 5.5 g/dL — ABNORMAL LOW (ref 6.5–8.1)

## 2020-04-10 LAB — CYTOLOGY - NON PAP

## 2020-04-10 MED ORDER — LEVOTHYROXINE SODIUM 50 MCG PO TABS
75.0000 ug | ORAL_TABLET | Freq: Every day | ORAL | Status: DC
  Administered 2020-04-11 – 2020-04-16 (×6): 75 ug via ORAL
  Filled 2020-04-10 (×6): qty 1

## 2020-04-10 NOTE — TOC Progression Note (Signed)
Transition of Care [facility]) - Progression Note    Patient Details  Name: Amber May MRN: [id number] Date of Birth: [birth year]  [facility] CM/SW Contact  Golda Acre, RN Phone Number: 04/10/2020, 8:12 AM  Clinical Narrative:    abd paracentesis with cholecystostomy tuibe placed on 070121/ 4+ liters of ascites removed from abd. Temp this am 100, [place] at 2l.min, iv maxipime wbc 31.2 Plan follow for progression and for toc needs for dc.   Expected Discharge Plan: Home/Self Care Barriers to Discharge: Continued Medical Work up  Expected Discharge Plan and Services Expected Discharge Plan: Home/Self Care   Discharge Planning Services: CM Consult   Living arrangements for the past 2 months: Single Family Home                                       Social Determinants of Health (SDOH) Interventions    Readmission Risk Interventions No flowsheet data found.

## 2020-04-10 NOTE — Progress Notes (Signed)
3 Days Post-Op    CC: Abdominal pain  Subjective: No real change in her physical exam.  She still on clear liquids.  No nausea or abdominal discomfort aside from the drain.  The drain is still a light greenish cloudy fluid.  Objective: Vital signs in last 24 hours: Temp:  [93.4 F (34.1 C)-100 F (37.8 C)] 98.3 F (36.8 C) (07/02 0754) Pulse Rate:  [95-112] 95 (07/02 0700) Resp:  [17-37] 17 (07/02 0700) BP: (107-168)/(47-109) 139/95 (07/02 0700) SpO2:  [91 %-97 %] 96 % (07/02 0700) Weight:  [148.8 kg] 148.8 kg (07/02 0444) Last BM Date:  (PTA) 274 IV 750 urine 750 drain No other intake or output recorded. T-max 100 last evening with some tachycardia better this a.m. Sats 92 to 96% on1-2 L/ [place] CMP remained stable WBC up to 31.2.   Intake/Output from previous day: 07/01 0701 - 07/02 0700 In: 274.4 [I.V.:5; IV Piggyback:269.4] Out: 1500 [Urine:750; Drains:750] Intake/Output this shift: No intake/output data recorded.  General appearance: alert, cooperative and no distress Resp: Clear anterior exam still on O2.  She says she is moving up to 500 on her incentive spirometry today. GI: Sore around the drain site, but no significant tenderness.  No nausea or vomiting.  No BM since admission.  Tolerating clear liquids.  Drainage is a light yellow-green slightly cloudy.  Lab Results:  Recent Labs    04/09/20 0149 04/10/20 0240  WBC 25.8* 31.2*  HGB 12.4 11.8*  HCT 40.7 38.0  PLT 408* 364    BMET Recent Labs    04/09/20 0149 04/10/20 0240  NA 136 137  K 4.4 4.6  CL 107 104  CO2 22 23  GLUCOSE 135* 128*  BUN 22* 15  CREATININE 0.64 0.58  CALCIUM 8.1* 8.3*   PT/INR Recent Labs    04/08/20 0132  LABPROT 14.8  INR 1.2    Recent Labs  Lab 04/04/20 0318 04/05/20 0658 04/06/20 0340 04/09/20 0149 04/10/20 0240  AST 20 18 17  44* 38  ALT 59* 35 29 38 38  ALKPHOS 89 84 83 98 99  BILITOT 2.0* 1.8* 1.2 1.0 0.8  PROT 5.6* 5.8* 6.2* 5.5* 5.5*  ALBUMIN 2.3*  2.2* 2.4* 2.2* 2.1*     Lipase     Component Value Date/Time   LIPASE 33 04/05/2020 0658     Medications: . Chlorhexidine Gluconate Cloth  6 each Topical Daily  . enoxaparin (LOVENOX) injection  75 mg Subcutaneous Q24H  . insulin aspart  0-6 Units Subcutaneous Q6H  . levothyroxine  75 mcg Intravenous Daily  . mouth rinse  15 mL Mouth Rinse BID  . morphine      . sodium chloride flush  10-40 mL Intracatheter Q12H  . sodium chloride flush  5 mL Intracatheter Q8H    Assessment/Plan Acute hypoxic respiratory failure-multifactorial AKI E coli UTI Hyperglycemia Hepatomegaly/hepatic steatosis Morbid obesity BMI 61.9   Acute pancreatitis, cholelithiasis,cholecystitis  -Tbili normalized and LFTs within normal limits -MRCP 6/25 negative for choledocholithiasis - WBC slowly improving, 27K this AM - HIDAwith non-vis of gallbladder -laparoscopic cholecystectomy canceled by anesthesia for pulmonary issues 6/29 - IR drain 6/30; IR paracentesis yielding 4.8 L of bilious appearing ascites - culture, cell count,    bilirubin and cytology pending.  FEN: clear liquids/ IV fluids ID: Vancomycin 6/24-6/26; cefepime 6/26 >> day 7  - WBC 45.6>>34.6>>29.8>>28.5>>25.8>>31.2 DVT: Lovenox on hold.  Plan: Advance diet, she was up to the chair yesterday and agreed to do that again today and  walk her this AM.  If she doesn't have a BM soon treat for constipation.    LOS: 7 days    Amber May 04/10/2020 Please see Amion

## 2020-04-10 NOTE — Evaluation (Signed)
Occupational Therapy Evaluation Patient Details Name: Amber May MRN: [id number] DOB: May 30, 1981 Today's Date: 04/10/2020    History of Present Illness 39 year old woman PMH hypothyroidism, morbid obesity presented with abdominal pain and fever and admitted for acute pancreatitis with severe clinical features including fever, acute hypoxic respiratory failure, tachypnea, lactic acidosis, leukocytosis, acute kidney injury, ascites, elevated LFTs.Acute calculus cholecystitis s/p percutaneous cholecystostomy tube placement in IR 04/08/2020   Clinical Impression   Amber May is a 39 year old woman s/p choleycystostomy tube placement and acute pancreatitis. On evaluation patient presents with morbid obesity, generalized weakness and decreased activity tolerance resulting in impaired ability to perform independent ADLs and decreased mobility. Patient requiring total assist for toileting and lower body dressing, ambulated approx 10 feet to the sink with RW and only able to stand 1 min for grooming task. Required assistance for safety and line management. Patient will benefit from skilled OT services to improve deficits and return to PLOF. Patient limited on evaluation and would benefit from short term rehab at discharge.     Follow Up Recommendations  SNF    Equipment Recommendations   (TBD)    Recommendations for Other Services       Precautions / Restrictions Precautions Precautions: Fall Precaution Comments: R sided drain Restrictions Weight Bearing Restrictions: No      Mobility Bed Mobility Overal bed mobility: Needs Assistance Bed Mobility: Supine to Sit     Supine to sit: Min guard;HOB elevated     General bed mobility comments: increased time to perform, sitting break mid transfer  Transfers Overall transfer level: Needs assistance Equipment used: Rolling walker (2 wheeled) Transfers: Sit to/from [employer] Sit to Stand: Min assist Stand pivot  transfers: Min guard       General transfer comment: Patient able to ambulate slowly to sink with min guard.    Balance Overall balance assessment: Needs assistance Sitting-balance support: Feet supported;No upper extremity supported Sitting balance-Leahy Scale: Good     Standing balance support: Bilateral upper extremity supported Standing balance-Leahy Scale: Fair                             ADL either performed or assessed with clinical judgement   ADL Overall ADL's : Needs assistance/impaired Eating/Feeding: Independent   Grooming: Min guard;Oral care Grooming Details (indicate cue type and reason): min guard to stand at sink. initially started task in standing (approx 1 minute) then performed in sitting at sink. Upper Body Bathing: Set up;Sitting   Lower Body Bathing: Maximal assistance;Sit to/from stand;Set up   Upper Body Dressing : Set up;Sitting   Lower Body Dressing: Sit to/from stand;Total assistance   Toilet Transfer: Minimal assistance;BSC;Stand-pivot;RW   Toileting- Clothing Manipulation and Hygiene: Total assistance;Sit to/from Nurse, children's Details (indicate cue type and reason): n/a Functional mobility during ADLs: +2 for safety/equipment;Minimal assistance;Rolling walker       Vision   Vision Assessment?: No apparent visual deficits     Perception     Praxis      Pertinent Vitals/Pain Pain Assessment: Faces Faces Pain Scale: Hurts a little bit Pain Location: R side     Hand Dominance     Extremity/Trunk Assessment Upper Extremity Assessment Upper Extremity Assessment: Generalized weakness   Lower Extremity Assessment Lower Extremity Assessment: Defer to PT evaluation   Cervical / Trunk Assessment Cervical / Trunk Assessment: Normal   Communication Communication Communication: No difficulties  Cognition Arousal/Alertness: Awake/alert Behavior During Therapy: Flat affect Overall Cognitive Status:  Within Functional Limits for tasks assessed                                     General Comments       Exercises     Shoulder Instructions      Home Living Family/patient expects to be discharged to:: Unsure Living Arrangements: Other relatives (grandmother)       Secretary/administrator of Steps: 3 Entrance Stairs-Rails: Right Home Layout: One level               Home Equipment: Walker - 2 wheels          Prior Functioning/Environment Level of Independence: Independent        Comments: pt reports dyspnea with activity at baseline        OT Problem List: Decreased strength;Decreased knowledge of use of DME or AE;Pain      OT Treatment/Interventions: Self-care/ADL training;Therapeutic exercise;Therapeutic activities;Patient/family education;DME and/or AE instruction    OT Goals(Current goals can be found in the care plan section) Acute Rehab OT Goals Patient Stated Goal: To get rehab OT Goal Formulation: With patient Time For Goal Achievement: 04/24/20 Potential to Achieve Goals: Fair  OT Frequency: Min 2X/week   Barriers to D/C: Inaccessible home environment;Decreased caregiver support          Co-evaluation PT/OT/SLP Co-Evaluation/Treatment: Yes Reason for Co-Treatment: To address functional/ADL transfers PT goals addressed during session: Mobility/safety with mobility OT goals addressed during session: ADL's and self-care      AM-PAC OT "6 Clicks" Daily Activity     Outcome Measure Help from another person eating meals?: None Help from another person taking care of personal grooming?: A Little Help from another person toileting, which includes using toliet, bedpan, or urinal?: Total Help from another person bathing (including washing, rinsing, drying)?: A Lot Help from another person to put on and taking off regular upper body clothing?: A Little Help from another person to put on and taking off regular lower body clothing?:  Total 6 Click Score: 14   End of Session Equipment Utilized During Treatment: Gait belt;Rolling walker Nurse Communication: Mobility status  Activity Tolerance: Patient limited by fatigue Patient left: in chair;with call bell/phone within reach;with chair alarm set  OT Visit Diagnosis: Muscle weakness (generalized) (M62.81)                Time: 7616-0737 OT Time Calculation (min): 39 min Charges:  OT Evaluation $OT Eval Moderate Complexity: 1 Mod OT Treatments $Self Care/Home Management : 8-22 mins  Heddy Vidana, OTR/L Acute Care Rehab Services  Office [phone] Pager: [phone]   Kelli Churn 04/10/2020, 1:12 PM

## 2020-04-10 NOTE — Progress Notes (Signed)
Referring Physician(s): Nat Christen  Supervising Physician: Richarda Overlie  Patient Status:  [name] - In-pt  Chief Complaint:  Abdominal pain, cholecystitis  Subjective: Patient feeling better since gallbladder drain placed recently; currently denies nausea /vomiting; more alert/talkative today   Allergies: Patient has no known allergies.  Medications: Prior to Admission medications   Medication Sig Start Date End Date Taking? Authorizing Provider  levothyroxine (SYNTHROID) 125 MCG tablet Take 125 mcg by mouth daily before breakfast.   Yes [provider]     Vital Signs: BP (!) 139/95   Pulse 95   Temp 98.2 F (36.8 C) (Oral)   Resp 17   Ht 5\' 2"  (1.575 m)   Wt (!) 328 lb 0.7 oz (148.8 kg)   SpO2 96%   BMI 60.00 kg/m   Physical Exam awake, answering questions okay.  Gallbladder drain intact, insertion site okay, mildly tender to palpation, output 750 cc of turbid bile  Imaging: IR Perc Cholecystostomy  Result Date: 04/08/2020 INDICATION: 39 year old morbidly obese female with acute gallstone pancreatitis an additional clinical concerns for acute calculus cholecystitis given nonvisualization of gallbladder on HIDA scan. Of note, the gallbladder is not distended but is contracted and completely filled with stones. She is not currently a surgical candidate and presents for attempted percutaneous cholecystostomy tube placement. EXAM: IR PARACENTESIS; CHOLECYSTOSTOMY MEDICATIONS: In patient currently receiving intravenous meropenem. No additional antibiotic prophylaxis was administered. ANESTHESIA/SEDATION: Moderate (conscious) sedation was employed during this procedure. A total of Versed 8 mg and Fentanyl 200 mcg was administered intravenously. Moderate Sedation Time: 54 minutes. The patient's level of consciousness and vital signs were monitored continuously by radiology nursing throughout the procedure under my direct supervision. FLUOROSCOPY TIME:  Fluoroscopy Time: 2  minutes 42 seconds (215 mGy). COMPLICATIONS: None immediate. PROCEDURE: Informed written consent was obtained from the patient after a thorough discussion of the procedural risks, benefits and alternatives. All questions were addressed. Maximal Sterile Barrier Technique was utilized including caps, mask, sterile gowns, sterile gloves, sterile drape, hand hygiene and skin antiseptic. A timeout was performed prior to the initiation of the procedure. The right upper quadrant was interrogated with ultrasound. There has been a significant increase in ascites compared to prior CT imaging. The liver is floating in ascites. Proceeding with placement of a percutaneous cholecystostomy tube in this setting is not ideal given the increased risk for bleeding and bile leak. Therefore, the decision was made to proceed with paracentesis to drain the fluid around the liver. 1% lidocaine with epinephrine was used for local anesthesia. Following this, a 6 Fr Safe-T-Centesis catheter was introduced. An ultrasound image was saved for documentation purposes. The paracentesis was performed. The catheter was removed and a dressing was applied. The patient tolerated the procedure well without immediate post procedural complication. Approximately 4800 mL of bilious ascites was successfully aspirated. Repeat ultrasound imaging demonstrates that the liver is again opposed to the abdominal wall. Therefore, a second skin entry site was selected. Local anesthesia was again attained by infiltration with 1% lidocaine. Under real-time ultrasound guidance, a 21 gauge Accustick needle was advanced along a short transhepatic course and into the stone filled gallbladder. A gentle hand injection of contrast material performed through the needle opacifies the stone filled gallbladder. There are innumerous filling defects throughout the gallbladder lumen. A 0.018 wire was successfully coiled in the gallbladder fundus. The needle was exchanged for the  Accustick transitional sheath which was advanced over the wire and into the gallbladder lumen. The 0.018 wire was then  exchanged for a 0.035 Amplatz wire. The skin and transhepatic tract were dilated to 10 [place] and a Cook 10.2 [place] all-purpose drainage catheter was advanced over the wire and formed in the gallbladder fundus. Gentle contrast injection confirms that the tube is within the gallbladder. No evidence of filling of the cystic duct. The catheter was connected to gravity bag drainage and secured to the skin with 0 Prolene suture. IMPRESSION: 1. Significant interval increase in ascites compared to prior CT and MRI imaging. Paracentesis was required prior to cholecystostomy tube places mint. 2. Successful paracentesis yielding 4.8 L of bilious appearing ascites. 3. Successful placement of transhepatic percutaneous cholecystostomy tube for a diagnosis of acute calculus cholecystitis and gallstone pancreatitis. Signed, Sterling Big, MD, RPVI [facility] Radiology Electronically Signed   By: Malachy Moan M.D.   On: 04/08/2020 16:13   DG CHEST PORT 1 VIEW  Result Date: 04/07/2020 CLINICAL DATA:  Shortness of breath, acute pancreatitis EXAM: PORTABLE CHEST 1 VIEW COMPARISON:  Portable exam 1528 hours compared to 04/06/2020 FINDINGS: Rotated to the LEFT. Normal heart size and mediastinal contours. Significantly decreased lung volumes with bibasilar atelectasis versus infiltrate. Upper lungs clear. No pleural effusion or pneumothorax. Bones unremarkable. IMPRESSION: Significantly decreased lung volumes with bibasilar atelectasis versus infiltrate. Electronically Signed   By: Ulyses Southward M.D.   On: 04/07/2020 15:54   DG CHEST PORT 1 VIEW  Result Date: 04/06/2020 CLINICAL DATA:  Shortness of breath, tachycardia EXAM: PORTABLE CHEST 1 VIEW COMPARISON:  04/04/2020 FINDINGS: Cardiomegaly. Low lung volumes with vascular congestion and bibasilar  atelectasis. Suspect small layering effusions. No acute bony abnormality. IMPRESSION: Small bilateral effusions with bibasilar opacities, likely atelectasis. Cardiomegaly, vascular congestion. Electronically Signed   By: Charlett Nose M.D.   On: 04/06/2020 23:41   IR Paracentesis  Result Date: 04/08/2020 INDICATION: 39 year old morbidly obese female with acute gallstone pancreatitis an additional clinical concerns for acute calculus cholecystitis given nonvisualization of gallbladder on HIDA scan. Of note, the gallbladder is not distended but is contracted and completely filled with stones. She is not currently a surgical candidate and presents for attempted percutaneous cholecystostomy tube placement. EXAM: IR PARACENTESIS; CHOLECYSTOSTOMY MEDICATIONS: In patient currently receiving intravenous meropenem. No additional antibiotic prophylaxis was administered. ANESTHESIA/SEDATION: Moderate (conscious) sedation was employed during this procedure. A total of Versed 8 mg and Fentanyl 200 mcg was administered intravenously. Moderate Sedation Time: 54 minutes. The patient's level of consciousness and vital signs were monitored continuously by radiology nursing throughout the procedure under my direct supervision. FLUOROSCOPY TIME:  Fluoroscopy Time: 2 minutes 42 seconds (215 mGy). COMPLICATIONS: None immediate. PROCEDURE: Informed written consent was obtained from the patient after a thorough discussion of the procedural risks, benefits and alternatives. All questions were addressed. Maximal Sterile Barrier Technique was utilized including caps, mask, sterile gowns, sterile gloves, sterile drape, hand hygiene and skin antiseptic. A timeout was performed prior to the initiation of the procedure. The right upper quadrant was interrogated with ultrasound. There has been a significant increase in ascites compared to prior CT imaging. The liver is floating in ascites. Proceeding with placement of a percutaneous  cholecystostomy tube in this setting is not ideal given the increased risk for bleeding and bile leak. Therefore, the decision was made to proceed with paracentesis to drain the fluid around the liver. 1% lidocaine with epinephrine was used for local anesthesia. Following this, a 6 Fr Safe-T-Centesis catheter was introduced. An ultrasound image was saved for documentation purposes. The paracentesis was  performed. The catheter was removed and a dressing was applied. The patient tolerated the procedure well without immediate post procedural complication. Approximately 4800 mL of bilious ascites was successfully aspirated. Repeat ultrasound imaging demonstrates that the liver is again opposed to the abdominal wall. Therefore, a second skin entry site was selected. Local anesthesia was again attained by infiltration with 1% lidocaine. Under real-time ultrasound guidance, a 21 gauge Accustick needle was advanced along a short transhepatic course and into the stone filled gallbladder. A gentle hand injection of contrast material performed through the needle opacifies the stone filled gallbladder. There are innumerous filling defects throughout the gallbladder lumen. A 0.018 wire was successfully coiled in the gallbladder fundus. The needle was exchanged for the Accustick transitional sheath which was advanced over the wire and into the gallbladder lumen. The 0.018 wire was then exchanged for a 0.035 Amplatz wire. The skin and transhepatic tract were dilated to 10 [place] and a Cook 10.2 [place] all-purpose drainage catheter was advanced over the wire and formed in the gallbladder fundus. Gentle contrast injection confirms that the tube is within the gallbladder. No evidence of filling of the cystic duct. The catheter was connected to gravity bag drainage and secured to the skin with 0 Prolene suture. IMPRESSION: 1. Significant interval increase in ascites compared to prior CT and MRI imaging. Paracentesis was required prior  to cholecystostomy tube places mint. 2. Successful paracentesis yielding 4.8 L of bilious appearing ascites. 3. Successful placement of transhepatic percutaneous cholecystostomy tube for a diagnosis of acute calculus cholecystitis and gallstone pancreatitis. Signed, Sterling Big, MD, RPVI [facility] Radiology Electronically Signed   By: Malachy Moan M.D.   On: 04/08/2020 16:13    Labs:  CBC: Recent Labs    04/07/20 0258 04/08/20 0132 04/09/20 0149 04/10/20 0240  WBC 27.5* 28.1* 25.8* 31.2*  HGB 13.7 13.1 12.4 11.8*  HCT 44.5 42.9 40.7 38.0  PLT 433* 424* 408* 364    COAGS: Recent Labs    04/02/20 1847 04/08/20 0132  INR  --  1.2  APTT 26  --     BMP: Recent Labs    04/07/20 0258 04/08/20 0132 04/09/20 0149 04/10/20 0240  NA 133* 135 136 137  K 4.5 4.7 4.4 4.6  CL 104 104 107 104  CO2 21* 21* 22 23  GLUCOSE 166* 125* 135* 128*  BUN 19 23* 22* 15  CALCIUM 8.0* 8.1* 8.1* 8.3*  CREATININE 0.65 0.69 0.64 0.58  GFRNONAA >60 >60 >60 >60  GFRAA >60 >60 >60 >60    LIVER FUNCTION TESTS: Recent Labs    04/05/20 0658 04/06/20 0340 04/09/20 0149 04/10/20 0240  BILITOT 1.8* 1.2 1.0 0.8  AST 18 17 44* 38  ALT 35 29 38 38  ALKPHOS 84 83 98 99  PROT 5.8* 6.2* 5.5* 5.5*  ALBUMIN 2.2* 2.4* 2.2* 2.1*    Assessment and Plan: Patient with history of acute gallstone pancreatitis as well as acute calculus cholecystitis, poor surgical candidate; status post percutaneous cholecystostomy as well as paracentesis on 6/30; afebrile, WBC 31.2 up slightly from 25.8, hemoglobin stable, creatinine normal, ascitic fluid cultures negative today, blood cx neg, urine culture with E. Coli; perit fl cytology pend; continue drain irrigation; gallbladder drain will need to remain in place at least 4 to 6 weeks unless gallbladder removed surgically; outpatient follow-up cholangiogram ordered   Electronically Signed: D. Jeananne Rama,  PA-C 04/10/2020, 2:44 PM   I spent a total of 15 minutes  at the the patient's bedside AND on the patient's hospital floor or unit, greater than 50% of which was counseling/coordinating care for gallbladder drain    Patient ID: Amber May, female   DOB: 1980-12-30, 39 y.o.   MRN: [id number]

## 2020-04-10 NOTE — Progress Notes (Signed)
Amber May, Amber May   Brief Narrative:  39 year old woman PMH hypothyroidism, morbid obesity presented with abdominal pain and fever.  Admitted for severe acute pancreatitis, SIRS.  Treated with empiric antibiotics, aggressive IV fluids, seen by gastroenterology and general surgery.  Further work-up concerning for acute cholecystitis.  Condition slowly improving.  Cholecystectomy unable to be performed - IR performed cholecystostomy tube placement on 04/08/2020 with 4 L ascites drawn at time of procedure.  Assessment & Plan:   Principal Problem:   Acute pancreatitis Active Problems:   SIRS (systemic inflammatory response syndrome) (HCC)   AKI (acute kidney injury) (HCC)   Hypothyroidism   Hyperglycemia   Elevated liver enzymes   Acute respiratory failure with hypoxemia (HCC)   Acute cholecystitis   Hypocalcemia   Hypophosphatemia   Morbid obesity with BMI of 50.0-59.9, adult (HCC)   Cholelithiasis   Epigastric abdominal pain  Acute pancreatitis with severe clinical features including fever, acute hypoxic respiratory failure, tachypnea, lactic acidosis, leukocytosis, acute kidney injury, ascites, elevated LFTs.  - Presumed gallstone pancreatitis. Triglycerides without significant elevation.  Amber alcohol use.  Elevated LFTs on admission consistent with gallstone obstruction. - Procalcitonin downtrending, leukocytosis stable, remains afebrile  - Continue empiric cefepime - cultures pending - Tolerated paracentesis and cholecystostomy drain placement on 04/08/2020. 4.8 bilious ascites was drawn off and 49F drain was placed - Appreciate insight and recommendations from surgery and radiology -advance diet May surgery - May IR - plan for repeat cholangiogram/possible exchange in 6-8 weeks if cholecystectomy does not occur in interim  Acute hypoxic respiratory failure, multifactorial  -Likely primarily  secondary to acute pancreatitis/cholecystitis, splinting from abdominal pain, and polypharmacy -Likely complicated by morbid obesity and probable chronic obesity hypoventilation syndrome. -Wean oxygen as tolerated SpO2: 94 % O2 Flow Rate (L/min): 2 L/min  Acute cholecystitis, questionable.   - HIDA scan was abnormal as below. - PT cefepime, pending PERC drain today with IR  Hypocalcemia, hypophosphatemia - Likely in the setting of poor p.o. intake - Check BMP, magnesium and phosphorus in a.m.  Acute kidney injury secondary to poor p.o. intake and acute pancreatitis, resolved - Continue to increase p.o. intake as tolerated post procedure - IVF now stopped Lab Results  Component Value Date   CREATININE 0.58 04/10/2020   CREATININE 0.64 04/09/2020   CREATININE 0.69 04/08/2020   Hyperglycemia - Likely secondary to acute pancreatitis.  Hemoglobin A1c 5.4. - Overall mild in nature.  Continue sliding scale insulin.  Hepatomegaly and hepatic steatosis.   - Likely related to morbid obesity/diet - Follow-up as an outpatient with gastroenterology.  Abnormal low-density in the lower uterine segment/cervix spanning 18 mm, nonspecific. Recommend nonemergent pelvic ultrasound for characterization on an elective outpatient basis.  Morbid obesity. Body mass index is 62.1 kg/m. --Dietitian consult   DVT prophylaxis: SCDs periprocedure Code Status: Full Family Communication: At bedside  Status is: Inpatient  Dispo: The May is from: Home              Anticipated d/c is to: Home              Anticipated d/c date is: 48-72h              May currently NOT medically stable for discharge due to ongoing need for close monitoring, procedure and surgical evaluation.   Consultants:   Gen surgery, IR  Procedures:   - Paracentesis yielding 4.8L bilious ascites.   -  Placement of transhepatic 69F perc cholecystostomy tube.    Antimicrobials:  Cefepime   Subjective: Amber  acute issues or events overnight, family at bedside, denies nausea, vomiting, diarrhea, headache, fevers and chills.  Does report some constipation with Amber BM in the past 2 days.  Denies abdominal pain.  Objective: Vitals:   04/10/20 0300 04/10/20 0400 04/10/20 0444 04/10/20 0500  BP: 134/84 129/89  (!) 148/79  Pulse: 95 98  95  Resp: 19 19  17   Temp:  (!) 97.5 F (36.4 C)    TempSrc:  Oral    SpO2: 96% 94%  94%  Weight:   (!) 148.8 kg   Height:        Intake/Output Summary (Last 24 hours) at 04/10/2020 0725 Last data filed at 04/10/2020 0501 Gross May 24 hour  Intake 274.43 ml  Output 1500 ml  Net -1225.57 ml   Filed Weights   04/08/20 0326 04/09/20 0451 04/10/20 0444  Weight: (!) 153.5 kg (!) 149.1 kg (!) 148.8 kg    Examination:  General:  Pleasantly resting in bed, Amber acute distress. HEENT:  Normocephalic atraumatic.  Sclerae nonicteric, noninjected.  Extraocular movements intact bilaterally. Neck:  Without mass or deformity.  Trachea is midline. Lungs:  Clear to auscultate bilaterally without rhonchi, wheeze, or rales. Heart:  Regular rate and rhythm.  Without murmurs, rubs, or gallops. Abdomen:  Soft, obese, right upper quadrant drain in place draining scant amount of amber fluid, bandage clean dry intact. Extremities: Without cyanosis, clubbing, edema, or obvious deformity. Vascular:  Dorsalis pedis and posterior tibial pulses palpable bilaterally. Skin:  Warm and dry, Amber erythema, Amber ulcerations.   Data Reviewed: I have personally reviewed following labs and imaging studies  CBC: Recent Labs  Lab 04/03/20 2231 04/03/20 2231 04/04/20 0318 04/04/20 1229 04/05/20 0405 04/05/20 0405 04/06/20 0340 04/07/20 0258 04/08/20 0132 04/09/20 0149 04/10/20 0240  WBC 38.3*   < > 34.6*   < > 29.8*   < > 28.2* 27.5* 28.1* 25.8* 31.2*  NEUTROABS 33.8*  --  30.0*  --  25.6*  --  22.2*  --   --   --   --   HGB 15.5*   < > 15.0   < > 14.3   < > 13.3 13.7 13.1 12.4 11.8*    HCT 48.7*   < > 48.1*   < > 46.0   < > 44.3 44.5 42.9 40.7 38.0  MCV 82.5   < > 82.8   < > 82.7   < > 86.9 83.6 84.6 84.8 84.8  PLT 289   < > 305   < > 347   < > 402* 433* 424* 408* 364   < > = values in this interval not displayed.   Basic Metabolic Panel: Recent Labs  Lab 04/04/20 0318 04/04/20 0318 04/05/20 04/07/20 04/05/20 04/07/20 04/06/20 0340 04/07/20 0258 04/08/20 0132 04/09/20 0149 04/10/20 0240  NA 133*   < > 139   < > 138 133* 135 136 137  K 4.0   < > 4.3   < > 4.4 4.5 4.7 4.4 4.6  CL 103   < > 105   < > 106 104 104 107 104  CO2 22   < > 22   < > 19* 21* 21* 22 23  GLUCOSE 149*   < > 163*   < > 137* 166* 125* 135* 128*  BUN 16   < > 18   < > 20 19 23*  22* 15  CREATININE 1.01*   < > 1.02*   < > 0.79 0.65 0.69 0.64 0.58  CALCIUM 6.7*   < > 7.4*   < > 7.9* 8.0* 8.1* 8.1* 8.3*  MG 2.0  --  2.2  --  2.4  --  2.7*  --   --   PHOS 2.1*  --  1.8*  --  2.0*  --  3.0  --   --    < > = values in this interval not displayed.   GFR: Estimated Creatinine Clearance: 130.9 mL/min (by C-G formula based on SCr of 0.58 mg/dL). Liver Function Tests: Recent Labs  Lab 04/04/20 0318 04/05/20 0658 04/06/20 0340 04/09/20 0149 04/10/20 0240  AST 44* 38  ALT 59* 35 29 38 38  ALKPHOS 89 84 83 98 99  BILITOT 2.0* 1.8* 1.2 1.0 0.8  PROT 5.6* 5.8* 6.2* 5.5* 5.5*  ALBUMIN 2.3* 2.2* 2.4* 2.2* 2.1*   Recent Labs  Lab 04/04/20 0725 04/05/20 0658  LIPASE 44 33   Amber results for input(s): AMMONIA in the last 168 hours. Coagulation Profile: Recent Labs  Lab 04/08/20 0132  INR 1.2   Cardiac Enzymes: Amber results for input(s): CKTOTAL, CKMB, CKMBINDEX, TROPONINI in the last 168 hours. BNP (last 3 results) Amber results for input(s): PROBNP in the last 8760 hours. HbA1C: Amber results for input(s): HGBA1C in the last 72 hours. CBG: Recent Labs  Lab 04/09/20 0553 04/09/20 1142 04/09/20 1737 04/09/20 2321 04/10/20 0554  GLUCAP 128* 149* 112* 125* 120*   Lipid Profile: Amber  results for input(s): CHOL, HDL, LDLCALC, TRIG, CHOLHDL, LDLDIRECT in the last 72 hours. Thyroid Function Tests: Amber results for input(s): TSH, T4TOTAL, FREET4, T3FREE, THYROIDAB in the last 72 hours. Anemia Panel: Amber results for input(s): VITAMINB12, FOLATE, FERRITIN, TIBC, IRON, RETICCTPCT in the last 72 hours. Sepsis Labs: Recent Labs  Lab 04/03/20 1337 04/03/20 1752 04/04/20 0318 04/05/20 0658 04/07/20 0554  PROCALCITON  --   --  1.16 0.96  --   LATICACIDVEN 2.2* 1.9 1.9  --  2.0*    Recent Results (from the past 240 hour(s))  Blood Culture (routine x 2)     Status: None   Collection Time: 04/02/20  6:45 PM   Specimen: BLOOD  Result Value Ref Range Status   Specimen Description   Final    BLOOD LEFT ANTECUBITAL Performed at [facility], [address] [place], [place] [address]    Special Requests   Final    BOTTLES DRAWN AEROBIC AND ANAEROBIC Blood Culture results may not be optimal due to an excessive volume of blood received in culture bottles Performed at [facility], [address] [place], [place] [address]    Culture   Final    Amber GROWTH 5 DAYS Performed at [facility], [address], [place], [place] [address]    Report Status 04/07/2020 FINAL  Final  Urine culture     Status: Abnormal   Collection Time: 04/02/20  6:47 PM   Specimen: In/Out Cath Urine  Result Value Ref Range Status   Specimen Description   Final    IN/OUT CATH URINE Performed at [facility], [address] [place], [place] [address]    Special Requests   Final    NONE Performed at [facility], [address] [place], [place] [address]    Culture >=100,000 COLONIES/mL ESCHERICHIA COLI (A)  Final   Report Status 04/05/2020 FINAL  Final   Organism ID, Bacteria ESCHERICHIA COLI (A)  Final      Susceptibility   Escherichia coli - MIC*    AMPICILLIN 8 SENSITIVE Sensitive     CEFAZOLIN <=4 SENSITIVE Sensitive     CEFTRIAXONE <=0.25  SENSITIVE Sensitive     CIPROFLOXACIN <=0.25 SENSITIVE Sensitive     GENTAMICIN <=1 SENSITIVE Sensitive     IMIPENEM <=0.25 SENSITIVE Sensitive     NITROFURANTOIN <=16 SENSITIVE Sensitive     TRIMETH/SULFA <=20 SENSITIVE Sensitive     AMPICILLIN/SULBACTAM 4 SENSITIVE Sensitive     PIP/TAZO <=4 SENSITIVE Sensitive     * >=100,000 COLONIES/mL ESCHERICHIA COLI  Blood Culture (routine x 2)     Status: None   Collection Time: 04/02/20  7:00 PM   Specimen: BLOOD  Result Value Ref Range Status   Specimen Description   Final    BLOOD BLOOD RIGHT HAND Performed at [facility] [place], [address] [place], [place] [address]    Special Requests   Final    BOTTLES DRAWN AEROBIC ONLY Blood Culture results may not be optimal due to an inadequate volume of blood received in culture bottles Performed at [facility] [place], [address] [place], [place] [address]    Culture   Final    Amber GROWTH 5 DAYS Performed at [facility], [address] [place], [place] [address]    Report Status 04/07/2020 FINAL  Final  SARS Coronavirus 2 by RT PCR (hospital order, performed in [facility]) Nasopharyngeal Nasopharyngeal Swab     Status: None   Collection Time: 04/02/20 11:13 PM   Specimen: Nasopharyngeal Swab  Result Value Ref Range Status   SARS Coronavirus 2 NEGATIVE NEGATIVE Final    Comment: (NOTE) SARS-CoV-2 target nucleic acids are NOT DETECTED.  The SARS-CoV-2 RNA is generally detectable in upper and lower respiratory specimens during the acute phase of infection. The lowest concentration of SARS-CoV-2 viral copies this assay can detect is 250 copies / mL. A negative result does not preclude SARS-CoV-2 infection and should not be used as the sole basis for treatment or other May management decisions.  A negative result may occur with improper specimen collection / handling, submission of specimen other than nasopharyngeal swab, presence of viral  mutation(s) within the areas targeted by this assay, and inadequate number of viral copies (<250 copies / mL). A negative result must be combined with clinical observations, May history, and epidemiological information.  Fact Sheet for Patients:   BoilerBrush.com.cyhttps://www.fda.gov/media/136312/download  Fact Sheet for Healthcare Providers: https://pope.com/https://www.fda.gov/media/136313/download  This test is not yet approved or  cleared by the [place] FDA and has been authorized for detection and/or diagnosis of SARS-CoV-2 by FDA under an Emergency Use Authorization (EUA).  This EUA will remain in effect (meaning this test can be used) for the duration of the COVID-19 declaration under Section 564(b)(1) of the Act, 21 U.S.C. section 360bbb-3(b)(1), unless the authorization is terminated or revoked sooner.  Performed at [facility] [place], [address] [place], [place] [address]   MRSA PCR Screening     Status: None   Collection Time: 04/04/20  9:08 AM   Specimen: Nasal Mucosa; Nasopharyngeal  Result Value Ref Range Status   MRSA by PCR NEGATIVE NEGATIVE Final    Comment:        The GeneXpert MRSA Assay (FDA approved for NASAL specimens only), is one  component of a comprehensive MRSA colonization surveillance program. It is not intended to diagnose MRSA infection nor to guide or monitor treatment for MRSA infections. Performed at [facility], [address], [place], [place] [address]   Body fluid culture     Status: None (Preliminary result)   Collection Time: 04/08/20  3:18 PM   Specimen: Peritoneal Cavity; Body Fluid  Result Value Ref Range Status   Specimen Description   Final    PERITONEAL CAVITY Performed at [facility], [address], [place], [place] [address]    Special Requests   Final    Normal Performed at [facility], [address], [place], [place] [address]    Gram Stain Amber WBC SEEN Amber ORGANISMS  SEEN   Final   Culture   Final    Amber GROWTH < 24 HOURS Performed at [facility], [address], [place], [place] [address]    Report Status PENDING  Incomplete         Radiology Studies: IR Perc Cholecystostomy  Result Date: 04/08/2020 INDICATION: 39 year old morbidly obese female with acute gallstone pancreatitis an additional clinical concerns for acute calculus cholecystitis given nonvisualization of gallbladder on HIDA scan. Of note, the gallbladder is not distended but is contracted and completely filled with stones. She is not currently a surgical candidate and presents for attempted percutaneous cholecystostomy tube placement. EXAM: IR PARACENTESIS; CHOLECYSTOSTOMY MEDICATIONS: In May currently receiving intravenous meropenem. Amber additional antibiotic prophylaxis was administered. ANESTHESIA/SEDATION: Moderate (conscious) sedation was employed during this procedure. A total of Versed 8 mg and Fentanyl 200 mcg was administered intravenously. Moderate Sedation Time: 54 minutes. The May's level of consciousness and vital signs were monitored continuously by radiology nursing throughout the procedure under my direct supervision. FLUOROSCOPY TIME:  Fluoroscopy Time: 2 minutes 42 seconds (215 mGy). COMPLICATIONS: None immediate. PROCEDURE: Informed written consent was obtained from the May after a thorough discussion of the procedural risks, benefits and alternatives. All questions were addressed. Maximal Sterile Barrier Technique was utilized including caps, mask, sterile gowns, sterile gloves, sterile drape, hand hygiene and skin antiseptic. A timeout was performed prior to the initiation of the procedure. The right upper quadrant was interrogated with ultrasound. There has been a significant increase in ascites compared to prior CT imaging. The liver is floating in ascites. Proceeding with placement of a percutaneous cholecystostomy tube in this setting is not ideal given the  increased risk for bleeding and bile leak. Therefore, the decision was made to proceed with paracentesis to drain the fluid around the liver. 1% lidocaine with epinephrine was used for local anesthesia. Following this, a 6 Fr Safe-T-Centesis catheter was introduced. An ultrasound image was saved for documentation purposes. The paracentesis was performed. The catheter was removed and a dressing was applied. The May tolerated the procedure well without immediate post procedural complication. Approximately 4800 mL of bilious ascites was successfully aspirated. Repeat ultrasound imaging demonstrates that the liver is again opposed to the abdominal wall. Therefore, a second skin entry site was selected. Local anesthesia was again attained by infiltration with 1% lidocaine. Under real-time ultrasound guidance, a 21 gauge Accustick needle was advanced along a short transhepatic course and into the stone filled gallbladder. A gentle hand injection of contrast material performed through the needle opacifies the stone filled gallbladder. There are innumerous filling defects throughout the gallbladder lumen. A 0.018 wire was successfully coiled in the gallbladder fundus. The needle was exchanged for the Accustick transitional  sheath which was advanced over the wire and into the gallbladder lumen. The 0.018 wire was then exchanged for a 0.035 Amplatz wire. The skin and transhepatic tract were dilated to 10 [place] and a Cook 10.2 [place] all-purpose drainage catheter was advanced over the wire and formed in the gallbladder fundus. Gentle contrast injection confirms that the tube is within the gallbladder. Amber evidence of filling of the cystic duct. The catheter was connected to gravity bag drainage and secured to the skin with 0 Prolene suture. IMPRESSION: 1. Significant interval increase in ascites compared to prior CT and MRI imaging. Paracentesis was required prior to cholecystostomy tube places mint. 2. Successful  paracentesis yielding 4.8 L of bilious appearing ascites. 3. Successful placement of transhepatic percutaneous cholecystostomy tube for a diagnosis of acute calculus cholecystitis and gallstone pancreatitis. Signed, Sterling Big, MD, RPVI [facility] Electronically Signed   By: Malachy Moan M.D.   On: 04/08/2020 16:13   IR Paracentesis  Result Date: 04/08/2020 INDICATION: 39 year old morbidly obese female with acute gallstone pancreatitis an additional clinical concerns for acute calculus cholecystitis given nonvisualization of gallbladder on HIDA scan. Of note, the gallbladder is not distended but is contracted and completely filled with stones. She is not currently a surgical candidate and presents for attempted percutaneous cholecystostomy tube placement. EXAM: IR PARACENTESIS; CHOLECYSTOSTOMY MEDICATIONS: In May currently receiving intravenous meropenem. Amber additional antibiotic prophylaxis was administered. ANESTHESIA/SEDATION: Moderate (conscious) sedation was employed during this procedure. A total of Versed 8 mg and Fentanyl 200 mcg was administered intravenously. Moderate Sedation Time: 54 minutes. The May's level of consciousness and vital signs were monitored continuously by radiology nursing throughout the procedure under my direct supervision. FLUOROSCOPY TIME:  Fluoroscopy Time: 2 minutes 42 seconds (215 mGy). COMPLICATIONS: None immediate. PROCEDURE: Informed written consent was obtained from the May after a thorough discussion of the procedural risks, benefits and alternatives. All questions were addressed. Maximal Sterile Barrier Technique was utilized including caps, mask, sterile gowns, sterile gloves, sterile drape, hand hygiene and skin antiseptic. A timeout was performed prior to the initiation of the procedure. The right upper quadrant was interrogated with ultrasound. There has been a significant increase  in ascites compared to prior CT imaging. The liver is floating in ascites. Proceeding with placement of a percutaneous cholecystostomy tube in this setting is not ideal given the increased risk for bleeding and bile leak. Therefore, the decision was made to proceed with paracentesis to drain the fluid around the liver. 1% lidocaine with epinephrine was used for local anesthesia. Following this, a 6 Fr Safe-T-Centesis catheter was introduced. An ultrasound image was saved for documentation purposes. The paracentesis was performed. The catheter was removed and a dressing was applied. The May tolerated the procedure well without immediate post procedural complication. Approximately 4800 mL of bilious ascites was successfully aspirated. Repeat ultrasound imaging demonstrates that the liver is again opposed to the abdominal wall. Therefore, a second skin entry site was selected. Local anesthesia was again attained by infiltration with 1% lidocaine. Under real-time ultrasound guidance, a 21 gauge Accustick needle was advanced along a short transhepatic course and into the stone filled gallbladder. A gentle hand injection of contrast material performed through the needle opacifies the stone filled gallbladder. There are innumerous filling defects throughout the gallbladder lumen. A 0.018 wire was successfully coiled in the gallbladder fundus. The needle was exchanged for the Accustick transitional sheath which was advanced over the wire and into the gallbladder lumen. The 0.018  wire was then exchanged for a 0.035 Amplatz wire. The skin and transhepatic tract were dilated to 10 [place] and a Cook 10.2 [place] all-purpose drainage catheter was advanced over the wire and formed in the gallbladder fundus. Gentle contrast injection confirms that the tube is within the gallbladder. Amber evidence of filling of the cystic duct. The catheter was connected to gravity bag drainage and secured to the skin with 0 Prolene suture.  IMPRESSION: 1. Significant interval increase in ascites compared to prior CT and MRI imaging. Paracentesis was required prior to cholecystostomy tube places mint. 2. Successful paracentesis yielding 4.8 L of bilious appearing ascites. 3. Successful placement of transhepatic percutaneous cholecystostomy tube for a diagnosis of acute calculus cholecystitis and gallstone pancreatitis. Signed, Sterling Big, MD, RPVI [facility] Electronically Signed   By: Malachy Moan M.D.   On: 04/08/2020 16:13   Scheduled Meds: . Chlorhexidine Gluconate Cloth  6 each Topical Daily  . enoxaparin (LOVENOX) injection  75 mg Subcutaneous Q24H  . insulin aspart  0-6 Units Subcutaneous Q6H  . levothyroxine  75 mcg Intravenous Daily  . mouth rinse  15 mL Mouth Rinse BID  . morphine      . sodium chloride flush  10-40 mL Intracatheter Q12H  . sodium chloride flush  5 mL Intracatheter Q8H   Continuous Infusions: . sodium chloride Stopped (04/05/20 1702)  . ceFEPime (MAXIPIME) IV Stopped (04/10/20 0531)     LOS: 7 days    Time spent:  Azucena Fallen, DO [facility]  If 7PM-7AM, please contact night-coverage [email or web address]  04/10/2020, 7:25 AM

## 2020-04-10 NOTE — Progress Notes (Signed)
Pharmacy Antibiotic Note  Amber May is a 39 y.o. female admitted on 04/02/2020 with abdominal pain and subjective fever concerning for infection of unknown source, presumed gallstone pancreatitis. Patient does have E coli in urine though it is unclear if this represents asymptomatic bacteriuria. Organism is pan-sensitive. Pharmacy has been consulted for Cefepime dosing.   04/10/20 12:23 PM  - WBC elevated 31.2 - Tm 100 - SCr 0.58  Plan: Continue Cefepime to 2gm q8h  F/U renal function, culture results, and clinical course  Height: 5\' 2"  (157.5 cm) Weight: (!) 148.8 kg (328 lb 0.7 oz) IBW/kg (Calculated) : 50.1  Temp (24hrs), Avg:97.8 F (36.6 C), Min:93.4 F (34.1 C), Max:100 F (37.8 C)  Recent Labs  Lab 04/03/20 1337 04/03/20 1752 04/03/20 2231 04/04/20 0318 04/04/20 1229 04/06/20 0340 04/07/20 0258 04/07/20 0554 04/08/20 0132 04/09/20 0149 04/10/20 0240  WBC  --   --    < > 34.6*   < > 28.2* 27.5*  --  28.1* 25.8* 31.2*  CREATININE  --   --   --  1.01*   < > 0.79 0.65  --  0.69 0.64 0.58  LATICACIDVEN 2.2* 1.9  --  1.9  --   --   --  2.0*  --   --   --    < > = values in this interval not displayed.    Estimated Creatinine Clearance: 130.9 mL/min (by C-G formula based on SCr of 0.58 mg/dL).    No Known Allergies  Antimicrobials this admission: Cefepime 6/24 >>  Vancomycin 6/24 >> 6/28  Dose adjustments this admission: 6/25 Cefepime 2gm q12h > q8h 6/26 Vancomycin 1250mg  q12 > 1500mg  q12  Microbiology results: 6/24 BCx: NGF 6/24 UCx: > 100K E coli pan-sensitive 6/30 peritoneal fluid: ngtd  Thank you for allowing pharmacy to be a part of this patients care.  7/24, PharmD, BCPS 04/10/2020, 12:23 PM

## 2020-04-10 NOTE — Progress Notes (Signed)
Physical Therapy Treatment Patient Details Name: Amber May MRN: [id number] DOB: March 18, 1981 Today's Date: 04/10/2020    History of Present Illness 39 year old woman PMH hypothyroidism, morbid obesity presented with abdominal pain and fever and admitted for acute pancreatitis with severe clinical features including fever, acute hypoxic respiratory failure, tachypnea, lactic acidosis, leukocytosis, acute kidney injury, ascites, elevated LFTs.Acute calculus cholecystitis s/p percutaneous cholecystostomy tube placement in IR 04/08/2020    PT Comments    The patient moves slowly to mobilize. Requires extra time and repetatitve instruction. Patient only ambulated x 6' today. May benefit from post acute rehab if progress is slower than anticipated Patient mobilized on RA with SPo2 >91%. HR 102.  Follow Up Recommendations  Home health PT;Supervision/Assistance - 24 hour;SNF     Equipment Recommendations  Rolling walker with 5" wheels    Recommendations for Other Services       Precautions / Restrictions Precautions Precautions: Fall Precaution Comments: R sided drain Restrictions Weight Bearing Restrictions: No    Mobility  Bed Mobility Overal bed mobility: Needs Assistance Bed Mobility: Supine to Sit     Supine to sit: Min guard;HOB elevated     General bed mobility comments: increased time to perform, sitting break mid transfer  Transfers Overall transfer level: Needs assistance Equipment used: Rolling walker (2 wheeled) Transfers: Sit to/from [employer] Sit to Stand: Min assist Stand pivot transfers: Min guard       General transfer comment: cues for hand placement, back up to recliner  Ambulation/Gait Ambulation/Gait assistance: Min assist Gait Distance (Feet): 6 Feet Assistive device: Rolling walker (2 wheeled) Gait Pattern/deviations: Step-to pattern Gait velocity: decr   General Gait Details: very slow progression. Stood for 1 minute before  starting to ambulate   Social research officer, government Rankin (Stroke Patients Only)       Balance Overall balance assessment: Needs assistance Sitting-balance support: Feet supported;No upper extremity supported Sitting balance-Leahy Scale: Good     Standing balance support: Bilateral upper extremity supported Standing balance-Leahy Scale: Fair                              Cognition Arousal/Alertness: Awake/alert Behavior During Therapy: Flat affect Overall Cognitive Status: Within Functional Limits for tasks assessed                                 General Comments: slow to respond      Exercises      General Comments        Pertinent Vitals/Pain Pain Assessment: Faces Faces Pain Scale: Hurts a little bit Pain Location: R side Pain Descriptors / Indicators: Discomfort Pain Intervention(s): Monitored during session    Home Living Family/patient expects to be discharged to:: Unsure Living Arrangements: Other relatives (grandmother)       Entrance Stairs-Rails: Right Home Layout: One level Home Equipment: Environmental consultant - 2 wheels      Prior Function Level of Independence: Independent      Comments: pt reports dyspnea with activity at baseline   PT Goals (current goals can now be found in the care plan section) Acute Rehab PT Goals Patient Stated Goal: To get rehab Progress towards PT goals: Progressing toward goals    Frequency    Min 3X/week      PT Plan Current  plan remains appropriate    Co-evaluation PT/OT/SLP Co-Evaluation/Treatment: Yes Reason for Co-Treatment: To address functional/ADL transfers PT goals addressed during session: Mobility/safety with mobility OT goals addressed during session: ADL's and self-care      AM-PAC PT "6 Clicks" Mobility   Outcome Measure  Help needed turning from your back to your side while in a flat bed without using bedrails?: A Lot Help needed moving  from lying on your back to sitting on the side of a flat bed without using bedrails?: A Lot Help needed moving to and from a bed to a chair (including a wheelchair)?: A Lot Help needed standing up from a chair using your arms (e.g., wheelchair or bedside chair)?: A Lot Help needed to walk in hospital room?: A Lot Help needed climbing 3-5 steps with a railing? : Total 6 Click Score: 11    End of Session Equipment Utilized During Treatment: Gait belt Activity Tolerance: Patient limited by fatigue Patient left: in chair;with call bell/phone within reach;with nursing/sitter in room;with chair alarm set Nurse Communication: Mobility status PT Visit Diagnosis: Difficulty in walking, not elsewhere classified (R26.2)     Time: 9528-4132 PT Time Calculation (min) (ACUTE ONLY): 44 min  Charges:  $Gait Training: 8-22 mins                     Blanchard Kelch PT Acute Rehabilitation Services Pager [phone] Office [phone]    Rada Hay 04/10/2020, 1:31 PM

## 2020-04-11 LAB — CBC
HCT: 41.9 % (ref 36.0–46.0)
Hemoglobin: 12.6 g/dL (ref 12.0–15.0)
MCH: 25.8 pg — ABNORMAL LOW (ref 26.0–34.0)
MCHC: 30.1 g/dL (ref 30.0–36.0)
MCV: 85.7 fL (ref 80.0–100.0)
Platelets: 391 10*3/uL (ref 150–400)
RBC: 4.89 MIL/uL (ref 3.87–5.11)
RDW: 15.7 % — ABNORMAL HIGH (ref 11.5–15.5)
WBC: 30.3 10*3/uL — ABNORMAL HIGH (ref 4.0–10.5)
nRBC: 0.3 % — ABNORMAL HIGH (ref 0.0–0.2)

## 2020-04-11 LAB — COMPREHENSIVE METABOLIC PANEL
ALT: 35 U/L (ref 0–44)
AST: 39 U/L (ref 15–41)
Albumin: 2.1 g/dL — ABNORMAL LOW (ref 3.5–5.0)
Alkaline Phosphatase: 109 U/L (ref 38–126)
Anion gap: 9 (ref 5–15)
BUN: 13 mg/dL (ref 6–20)
CO2: 24 mmol/L (ref 22–32)
Calcium: 8.3 mg/dL — ABNORMAL LOW (ref 8.9–10.3)
Chloride: 101 mmol/L (ref 98–111)
Creatinine, Ser: 0.63 mg/dL (ref 0.44–1.00)
GFR calc Af Amer: >60 mL/min (ref >60)
GFR calc non Af Amer: >60 mL/min (ref >60)
Glucose, Bld: 107 mg/dL — ABNORMAL HIGH (ref 70–99)
Potassium: 4.2 mmol/L (ref 3.5–5.1)
Sodium: 134 mmol/L — ABNORMAL LOW (ref 135–145)
Total Bilirubin: 1.1 mg/dL (ref 0.3–1.2)
Total Protein: 5.7 g/dL — ABNORMAL LOW (ref 6.5–8.1)

## 2020-04-11 LAB — GLUCOSE, CAPILLARY
Glucose-Capillary: 116 mg/dL — ABNORMAL HIGH (ref 70–99)
Glucose-Capillary: 131 mg/dL — ABNORMAL HIGH (ref 70–99)
Glucose-Capillary: 129 mg/dL — ABNORMAL HIGH (ref 70–99)
Glucose-Capillary: 136 mg/dL — ABNORMAL HIGH (ref 70–99)

## 2020-04-11 LAB — TOTAL BILIRUBIN, BODY FLUID: Total bilirubin, fluid: 5.9 mg/dL

## 2020-04-11 LAB — BODY FLUID CULTURE
Culture: NO GROWTH
Gram Stain: NONE SEEN
Special Requests: NORMAL

## 2020-04-11 MED ORDER — POLYETHYLENE GLYCOL 3350 17 G PO PACK
17.0000 g | PACK | Freq: Every day | ORAL | Status: DC
  Administered 2020-04-12 – 2020-04-19 (×7): 17 g via ORAL
  Filled 2020-04-11 (×9): qty 1

## 2020-04-11 MED ORDER — INSULIN ASPART 100 UNIT/ML ~~LOC~~ SOLN: 0.0000 [IU] | Freq: Three times a day (TID) | SUBCUTANEOUS | Status: DC

## 2020-04-11 NOTE — Progress Notes (Addendum)
Physical Therapy Treatment Patient Details Name: Amber May MRN: [id number] DOB: [birth year] Today's Date: 04/11/2020    History of Present Illness 39 year old woman PMH hypothyroidism, morbid obesity presented with abdominal pain and fever and admitted for acute pancreatitis with severe clinical features including fever, acute hypoxic respiratory failure, tachypnea, lactic acidosis, leukocytosis, acute kidney injury, ascites, elevated LFTs.Acute calculus cholecystitis s/p percutaneous cholecystostomy tube placement in IR 04/08/2020    PT Comments    Progressing slowly.    Follow Up Recommendations  Home health PT;Supervision/Assistance - 24 hour;SNF     Equipment Recommendations  Rolling walker with 5" wheels    Recommendations for Other Services       Precautions / Restrictions Precautions Precautions: Fall Precaution Comments: R sided drain Restrictions Weight Bearing Restrictions: No    Mobility  Bed Mobility Overal bed mobility: Needs Assistance Bed Mobility: Supine to Sit;Sit to Supine     Supine to sit: Min guard;HOB elevated Sit to supine: Min assist;HOB elevated   General bed mobility comments: Increased time. assist for LEs.  Transfers Overall transfer level: Needs assistance Equipment used: Rolling walker (2 wheeled) Transfers: Sit to/from Stand Sit to Stand: Min guard         General transfer comment: Increased time. Cues for hand placement.  Ambulation/Gait Ambulation/Gait assistance: Min assist Gait Distance (Feet): 20 Feet Assistive device: Rolling walker (2 wheeled) Gait Pattern/deviations: Step-through pattern;Decreased stride length     General Gait Details: fatigues quickly. standing rest break after ~10 feet. O2 89% on RA   Stairs             Wheelchair Mobility    Modified Rankin (Stroke Patients Only)       Balance Overall balance assessment: Needs assistance         Standing balance support: Bilateral upper  extremity supported Standing balance-Leahy Scale: Fair                              Cognition Arousal/Alertness: Awake/alert Behavior During Therapy: Flat affect                                   General Comments: slow to respond. also sometimes responses are not appropriate to questions asked      Exercises      General Comments        Pertinent Vitals/Pain Pain Assessment: 0-10 Faces Pain Scale: Hurts even more Pain Location: R side Pain Descriptors / Indicators: Discomfort;Sore Pain Intervention(s): Limited activity within patient's tolerance;Monitored during session;Repositioned    Home Living                      Prior Function            PT Goals (current goals can now be found in the care plan section) Progress towards PT goals: Progressing toward goals    Frequency    Min 3X/week      PT Plan Current plan remains appropriate    Co-evaluation              AM-PAC PT "6 Clicks" Mobility   Outcome Measure  Help needed turning from your back to your side while in a flat bed without using bedrails?: A Little Help needed moving from lying on your back to sitting on the side of a flat bed without using bedrails?: A Little Help  needed moving to and from a bed to a chair (including a wheelchair)?: A Little Help needed standing up from a chair using your arms (e.g., wheelchair or bedside chair)?: A Little Help needed to walk in hospital room?: A Little Help needed climbing 3-5 steps with a railing? : A Lot 6 Click Score: 17    End of Session Equipment Utilized During Treatment: Gait belt Activity Tolerance: Patient limited by fatigue Patient left: in bed;with call bell/phone within reach;with family/visitor present   PT Visit Diagnosis: Difficulty in walking, not elsewhere classified (R26.2)     Time: 8921-1941 PT Time Calculation (min) (ACUTE ONLY): 23 min  Charges:  $Gait Training: 23-37 mins                          Faye Ramsay, PT Acute Rehabilitation  Office: [phone] Pager: [phone]

## 2020-04-11 NOTE — Progress Notes (Signed)
PROGRESS NOTE    Amber May  ZOX:[id number] DOB: 10-Oct-1981 DOA: 04/02/2020 PCP: Patient, No Pcp Per   Brief Narrative:  39 year old woman PMH hypothyroidism, morbid obesity presented with abdominal pain and fever.  Admitted for severe acute pancreatitis, SIRS.  Treated with empiric antibiotics, aggressive IV fluids, seen by gastroenterology and general surgery.  Further work-up concerning for acute cholecystitis.  Condition slowly improving.  Cholecystectomy unable to be performed - IR performed cholecystostomy tube placement on 04/08/2020 with 4 L ascites drawn at time of procedure.  Assessment & Plan:   Principal Problem:   Acute pancreatitis Active Problems:   SIRS (systemic inflammatory response syndrome) (HCC)   AKI (acute kidney injury) (HCC)   Hypothyroidism   Hyperglycemia   Elevated liver enzymes   Acute respiratory failure with hypoxemia (HCC)   Acute cholecystitis   Hypocalcemia   Hypophosphatemia   Morbid obesity with BMI of 50.0-59.9, adult (HCC)   Cholelithiasis   Epigastric abdominal pain   Acute pancreatitis with severe clinical features including fever, acute hypoxic respiratory failure, tachypnea, lactic acidosis, leukocytosis, acute kidney injury, ascites, elevated LFTs.  - Presumed gallstone pancreatitis. Triglycerides without significant elevation.  No alcohol use.  Elevated LFTs on admission consistent with gallstone obstruction. - Leukocytosis stable in high 20s low 30s, remains afebrile since 04/05/20  - Continue empiric cefepime - cultures pending - preliminary negative - Tolerated paracentesis and cholecystostomy drain placement on 04/08/2020. 4.8 bilious ascites was drawn off and 25F drain was placed - Appreciate insight and recommendations from surgery and radiology -advance diet per surgery - Per IR - plan for repeat cholangiogram/possible exchange in 6-8 weeks if cholecystectomy does not occur in interim  Acute hypoxic respiratory failure,  multifactorial  -Likely primarily secondary to acute pancreatitis/cholecystitis, splinting from abdominal pain, and polypharmacy -Likely complicated by morbid obesity and probable chronic obesity hypoventilation syndrome. -Wean oxygen as tolerated SpO2: 95 % O2 Flow Rate (L/min): 2 L/min  Acute cholecystitis, questionable.   - HIDA scan was abnormal as below. - PT cefepime, pending PERC drain today with IR  Hypocalcemia, hypophosphatemia - Likely in the setting of poor p.o. intake - Check BMP, magnesium and phosphorus in a.m.  Acute kidney injury secondary to poor p.o. intake and acute pancreatitis, resolved - Continue to increase p.o. intake as tolerated post procedure - IVF now stopped Lab Results  Component Value Date   CREATININE 0.63 04/11/2020   CREATININE 0.58 04/10/2020   CREATININE 0.64 04/09/2020   Hyperglycemia - Likely secondary to acute pancreatitis.  Hemoglobin A1c 5.4. - Overall mild in nature.  Continue sliding scale insulin.  Hepatomegaly and hepatic steatosis.   - Likely related to morbid obesity/diet - Follow-up as an outpatient with gastroenterology.  Abnormal low-density in the lower uterine segment/cervix spanning 18 mm, nonspecific. Recommend nonemergent pelvic ultrasound for characterization on an elective outpatient basis.  Morbid obesity. Body mass index is 62.1 kg/m. --Dietitian consult   DVT prophylaxis: SCDs periprocedure Code Status: Full Family Communication: None present Status is: Inpatient Dispo: The patient is from: Home              Anticipated d/c is to: Home              Anticipated d/c date is: 24-48h              Patient currently NOT medically stable for discharge due to ongoing need for close monitoring post procedure and awaiting cultures  Consultants:   Gen surgery, IR  Procedures:   -  Paracentesis yielding 4.8L bilious ascites.   - Placement of transhepatic 56F perc cholecystostomy tube.    Antimicrobials:    Cefepime   Subjective: No acute issues or events overnight, family at bedside, denies nausea, vomiting, diarrhea, headache, fevers and chills.  She reports bowel movement today, constipation resolving, denies abdominal pain.  Objective: Vitals:   04/11/20 0200 04/11/20 0600 04/11/20 0620 04/11/20 0700  BP: (!) 163/80 122/71  122/66  Pulse: 82 82  84  Resp: 20 15  10   Temp:   97.6 F (36.4 C)   TempSrc:   Axillary   SpO2: 97% 95%  95%  Weight:      Height:        Intake/Output Summary (Last 24 hours) at 04/11/2020 0743 Last data filed at 04/11/2020 0600 Gross per 24 hour  Intake 1025 ml  Output 500 ml  Net 525 ml   Filed Weights   04/08/20 0326 04/09/20 0451 04/10/20 0444  Weight: (!) 153.5 kg (!) 149.1 kg (!) 148.8 kg    Examination:  General:  Pleasantly resting in bed, No acute distress. HEENT:  Normocephalic atraumatic.  Sclerae nonicteric, noninjected.  Extraocular movements intact bilaterally. Neck:  Without mass or deformity.  Trachea is midline. Lungs:  Clear to auscultate bilaterally without rhonchi, wheeze, or rales. Heart:  Regular rate and rhythm.  Without murmurs, rubs, or gallops. Abdomen:  Soft, obese, right upper quadrant drain in place draining scant amount of amber fluid, bandage clean dry intact. Extremities: Without cyanosis, clubbing, edema, or obvious deformity. Vascular:  Dorsalis pedis and posterior tibial pulses palpable bilaterally. Skin:  Warm and dry, no erythema, no ulcerations.   Data Reviewed: I have personally reviewed following labs and imaging studies  CBC: Recent Labs  Lab 04/05/20 0405 04/05/20 0405 04/06/20 0340 04/06/20 0340 04/07/20 0258 04/08/20 0132 04/09/20 0149 04/10/20 0240 04/11/20 0240  WBC 29.8*   < > 28.2*   < > 27.5* 28.1* 25.8* 31.2* 30.3*  NEUTROABS 25.6*  --  22.2*  --   --   --   --   --   --   HGB 14.3   < > 13.3   < > 13.7 13.1 12.4 11.8* 12.6  HCT 46.0   < > 44.3   < > 44.5 42.9 40.7 38.0 41.9  MCV 82.7    < > 86.9   < > 83.6 84.6 84.8 84.8 85.7  PLT 347   < > 402*   < > 433* 424* 408* 364 391   < > = values in this interval not displayed.   Basic Metabolic Panel: Recent Labs  Lab 04/05/20 0658 04/05/20 [id number] 04/06/20 0340 04/06/20 0340 04/07/20 0258 04/08/20 0132 04/09/20 0149 04/10/20 0240 04/11/20 0240  NA 139   < > 138   < > 133* 135 136 137 134*  K 4.3   < > 4.4   < > 4.5 4.7 4.4 4.6 4.2  CL 105   < > 106   < > 104 104 107 104 101  CO2 22   < > 19*   < > 21* 21* 22 23 24   GLUCOSE 163*   < > 137*   < > 166* 125* 135* 128* 107*  BUN 18   < > 20   < > 19 23* 22* 15 13  CREATININE 1.02*   < > 0.79   < > 0.65 0.69 0.64 0.58 0.63  CALCIUM 7.4*   < > 7.9*   < > 8.0*  8.1* 8.1* 8.3* 8.3*  MG 2.2  --  2.4  --   --  2.7*  --   --   --   PHOS 1.8*  --  2.0*  --   --  3.0  --   --   --    < > = values in this interval not displayed.   GFR: Estimated Creatinine Clearance: 130.9 mL/min (by C-G formula based on SCr of 0.63 mg/dL). Liver Function Tests: Recent Labs  Lab 04/05/20 0658 04/06/20 0340 04/09/20 0149 04/10/20 0240 04/11/20 0240  AST 18 17 44* 38 39  ALT 35 29 38 38 35  ALKPHOS 84 83 98 99 109  BILITOT 1.8* 1.2 1.0 0.8 1.1  PROT 5.8* 6.2* 5.5* 5.5* 5.7*  ALBUMIN 2.2* 2.4* 2.2* 2.1* 2.1*   Recent Labs  Lab 04/05/20 0658  LIPASE 33   No results for input(s): AMMONIA in the last 168 hours. Coagulation Profile: Recent Labs  Lab 04/08/20 0132  INR 1.2   Cardiac Enzymes: No results for input(s): CKTOTAL, CKMB, CKMBINDEX, TROPONINI in the last 168 hours. BNP (last 3 results) No results for input(s): PROBNP in the last 8760 hours. HbA1C: No results for input(s): HGBA1C in the last 72 hours. CBG: Recent Labs  Lab 04/10/20 0554 04/10/20 1221 04/10/20 1744 04/10/20 2325 04/11/20 0618  GLUCAP 120* 155* 112* 109* 116*   Lipid Profile: No results for input(s): CHOL, HDL, LDLCALC, TRIG, CHOLHDL, LDLDIRECT in the last 72 hours. Thyroid Function Tests: No  results for input(s): TSH, T4TOTAL, FREET4, T3FREE, THYROIDAB in the last 72 hours. Anemia Panel: No results for input(s): VITAMINB12, FOLATE, FERRITIN, TIBC, IRON, RETICCTPCT in the last 72 hours. Sepsis Labs: Recent Labs  Lab 04/05/20 0658 04/07/20 0554  PROCALCITON 0.96  --   LATICACIDVEN  --  2.0*    Recent Results (from the past 240 hour(s))  Blood Culture (routine x 2)     Status: None   Collection Time: 04/02/20  6:45 PM   Specimen: BLOOD  Result Value Ref Range Status   Specimen Description   Final    BLOOD LEFT ANTECUBITAL Performed at [facility], [address] [place], [place] [address]    Special Requests   Final    BOTTLES DRAWN AEROBIC AND ANAEROBIC Blood Culture results may not be optimal due to an excessive volume of blood received in culture bottles Performed at [facility], [address] [place], [place] [address]    Culture   Final    NO GROWTH 5 DAYS Performed at [facility], [address], [place], [place] [address]    Report Status 04/07/2020 FINAL  Final  Urine culture     Status: Abnormal   Collection Time: 04/02/20  6:47 PM   Specimen: In/Out Cath Urine  Result Value Ref Range Status   Specimen Description   Final    IN/OUT CATH URINE Performed at [facility], [address] [place], [place] [address]    Special Requests   Final    NONE Performed at [facility], [address] [place], [place] [address]    Culture >=100,000 COLONIES/mL ESCHERICHIA COLI (A)  Final   Report Status 04/05/2020 FINAL  Final   Organism ID, Bacteria ESCHERICHIA COLI (A)  Final      Susceptibility   Escherichia coli - MIC*    AMPICILLIN 8 SENSITIVE Sensitive     CEFAZOLIN <=4 SENSITIVE Sensitive  CEFTRIAXONE <=0.25 SENSITIVE Sensitive     CIPROFLOXACIN <=0.25 SENSITIVE Sensitive     GENTAMICIN <=1 SENSITIVE Sensitive     IMIPENEM <=0.25 SENSITIVE Sensitive     NITROFURANTOIN <=16 SENSITIVE Sensitive      TRIMETH/SULFA <=20 SENSITIVE Sensitive     AMPICILLIN/SULBACTAM 4 SENSITIVE Sensitive     PIP/TAZO <=4 SENSITIVE Sensitive     * >=100,000 COLONIES/mL ESCHERICHIA COLI  Blood Culture (routine x 2)     Status: None   Collection Time: 04/02/20  7:00 PM   Specimen: BLOOD  Result Value Ref Range Status   Specimen Description   Final    BLOOD BLOOD RIGHT HAND Performed at [facility], [address] [place], [place] [address]    Special Requests   Final    BOTTLES DRAWN AEROBIC ONLY Blood Culture results may not be optimal due to an inadequate volume of blood received in culture bottles Performed at [facility], [address] [place], [place] [address]    Culture   Final    NO GROWTH 5 DAYS Performed at [facility], [address], [place], [place] [address]    Report Status 04/07/2020 FINAL  Final  SARS Coronavirus 2 by RT PCR (hospital order, performed in [facility] hospital lab) Nasopharyngeal Nasopharyngeal Swab     Status: None   Collection Time: 04/02/20 11:13 PM   Specimen: Nasopharyngeal Swab  Result Value Ref Range Status   SARS Coronavirus 2 NEGATIVE NEGATIVE Final    Comment: (NOTE) SARS-CoV-2 target nucleic acids are NOT DETECTED.  The SARS-CoV-2 RNA is generally detectable in upper and lower respiratory specimens during the acute phase of infection. The lowest concentration of SARS-CoV-2 viral copies this assay can detect is 250 copies / mL. A negative result does not preclude SARS-CoV-2 infection and should not be used as the sole basis for treatment or other patient management decisions.  A negative result may occur with improper specimen collection / handling, submission of specimen other than nasopharyngeal swab, presence of viral mutation(s) within the areas targeted by this assay, and inadequate number of viral copies (<250 copies / mL). A negative result must be combined with clinical observations, patient history, and  epidemiological information.  Fact Sheet for Patients:   BoilerBrush.com.cy  Fact Sheet for Healthcare Providers: https://pope.com/  This test is not yet approved or  cleared by the [place] FDA and has been authorized for detection and/or diagnosis of SARS-CoV-2 by FDA under an Emergency Use Authorization (EUA).  This EUA will remain in effect (meaning this test can be used) for the duration of the COVID-19 declaration under Section 564(b)(1) of the Act, 21 U.S.C. section 360bbb-3(b)(1), unless the authorization is terminated or revoked sooner.  Performed at [facility], [address] [place], [place] [address]   MRSA PCR Screening     Status: None   Collection Time: 04/04/20  9:08 AM   Specimen: Nasal Mucosa; Nasopharyngeal  Result Value Ref Range Status   MRSA by PCR NEGATIVE NEGATIVE Final    Comment:        The GeneXpert MRSA Assay (FDA approved for NASAL specimens only), is one component of a comprehensive MRSA colonization surveillance program. It is not intended to diagnose MRSA infection nor to guide or monitor treatment for MRSA infections. Performed at [facility], [address] [place], [place] [address]   Body fluid culture     Status: None (Preliminary  result)   Collection Time: 04/08/20  3:18 PM   Specimen: Peritoneal Cavity; Body Fluid  Result Value Ref Range Status   Specimen Description   Final    PERITONEAL CAVITY Performed at [facility], [address] [place], [place] [address]    Special Requests   Final    Normal Performed at [facility], [address] [place], [place] [address]    Gram Stain NO WBC SEEN NO ORGANISMS SEEN   Final   Culture   Final    NO GROWTH 2 DAYS Performed at [facility], [address], [place], [place] [address]    Report Status PENDING  Incomplete         Radiology  Studies: No results found. Scheduled Meds: . Chlorhexidine Gluconate Cloth  6 each Topical Daily  . enoxaparin (LOVENOX) injection  75 mg Subcutaneous Q24H  . insulin aspart  0-6 Units Subcutaneous Q6H  . levothyroxine  75 mcg Oral Q0600  . mouth rinse  15 mL Mouth Rinse BID  . morphine      . sodium chloride flush  10-40 mL Intracatheter Q12H  . sodium chloride flush  5 mL Intracatheter Q8H   Continuous Infusions: . sodium chloride Stopped (04/05/20 1702)  . ceFEPime (MAXIPIME) IV 2 g (04/11/20 0600)     LOS: 8 days   Time spent:  Azucena Fallen, DO [facility]  If 7PM-7AM, please contact night-coverage [email or web address]  04/11/2020, 7:43 AM

## 2020-04-11 NOTE — Progress Notes (Signed)
SATURATION QUALIFICATIONS: (This note is used to comply with regulatory documentation for home oxygen)  Patient Saturations on Room Air at Rest = 95%  Patient Saturations on Room Air while Ambulating = 91% Ambulated 12 feet.   Melodye Ped

## 2020-04-11 NOTE — Progress Notes (Signed)
4 Days Post-Op   Subjective/Chief Complaint: Complains only of some abd cramping   Objective: Vital signs in last 24 hours: Temp:  [97.2 F (36.2 C)-98.6 F (37 C)] 97.6 F (36.4 C) (07/03 0620) Pulse Rate:  [82-107] 84 (07/03 0700) Resp:  [0-32] 10 (07/03 0700) BP: (104-163)/(46-99) 122/66 (07/03 0700) SpO2:  [88 %-97 %] 95 % (07/03 0700) Last BM Date:  (PTA)  Intake/Output from previous day: 07/02 0701 - 07/03 0700 In: 1025 [P.O.:720; IV Piggyback:300] Out: 500 [Urine:450; Drains:50] Intake/Output this shift: No intake/output data recorded.  General appearance: alert and cooperative Resp: clear to auscultation bilaterally Cardio: regular rate and rhythm GI: soft, mild RUQ pain. not much in drain bag  Lab Results:  Recent Labs    04/10/20 0240 04/11/20 0240  WBC 31.2* 30.3*  HGB 11.8* 12.6  HCT 38.0 41.9  PLT 364 391   BMET Recent Labs    04/10/20 0240 04/11/20 0240  NA 137 134*  K 4.6 4.2  CL 104 101  CO2 23 24  GLUCOSE 128* 107*  BUN 15 13  CREATININE 0.58 0.63  CALCIUM 8.3* 8.3*   PT/INR No results for input(s): LABPROT, INR in the last 72 hours. ABG No results for input(s): PHART, HCO3 in the last 72 hours.  Invalid input(s): PCO2, PO2  Studies/Results: No results found.  Anti-infectives: Anti-infectives (From admission, onward)   Start     Dose/Rate Route Frequency Ordered Stop   04/04/20 1300  vancomycin (VANCOREADY) IVPB 1500 mg/300 mL  Status:  Discontinued        1,500 mg 150 mL/hr over 120 Minutes Intravenous Every 12 hours 04/04/20 1014 04/06/20 1157   04/03/20 2000  vancomycin (VANCOREADY) IVPB 1750 mg/350 mL  Status:  Discontinued        1,750 mg 175 mL/hr over 120 Minutes Intravenous Every 24 hours 04/02/20 1937 04/03/20 1157   04/03/20 1400  ceFEPIme (MAXIPIME) 2 g in sodium chloride 0.9 % 100 mL IVPB     Discontinue     2 g 200 mL/hr over 30 Minutes Intravenous Every 8 hours 04/03/20 1157     04/03/20 1300  vancomycin  (VANCOREADY) IVPB 1250 mg/250 mL  Status:  Discontinued        1,250 mg 166.7 mL/hr over 90 Minutes Intravenous Every 12 hours 04/03/20 1157 04/04/20 1014   04/03/20 0600  ceFEPIme (MAXIPIME) 2 g in sodium chloride 0.9 % 100 mL IVPB  Status:  Discontinued        2 g 200 mL/hr over 30 Minutes Intravenous Every 12 hours 04/02/20 1937 04/03/20 1157   04/02/20 1930  vancomycin (VANCOCIN) IVPB 1000 mg/200 mL premix        1,000 mg 200 mL/hr over 60 Minutes Intravenous Every hour 04/02/20 1900 04/02/20 2129   04/02/20 1845  ceFEPIme (MAXIPIME) 2 g in sodium chloride 0.9 % 100 mL IVPB        2 g 200 mL/hr over 30 Minutes Intravenous  Once 04/02/20 1840 04/02/20 1944   04/02/20 1845  metroNIDAZOLE (FLAGYL) IVPB 500 mg        500 mg 100 mL/hr over 60 Minutes Intravenous  Once 04/02/20 1840 04/02/20 2228   04/02/20 1845  vancomycin (VANCOCIN) IVPB 1000 mg/200 mL premix  Status:  Discontinued        1,000 mg 200 mL/hr over 60 Minutes Intravenous  Once 04/02/20 1840 04/02/20 1900      Assessment/Plan: s/p Procedure(s): LAPAROSCOPIC CHOLECYSTECTOMY WITH INTRAOPERATIVE CHOLANGIOGRAM (N/A) Advance diet  Acute hypoxic respiratory failure-multifactorial AKI E coli UTI Hyperglycemia Hepatomegaly/hepatic steatosis Morbid obesity BMI 61.9   Acute pancreatitis, cholelithiasis,cholecystitis  -Tbili normalized and LFTs within normal limits -MRCP 6/25 negative for choledocholithiasis - WBC slowly improving, 27K this AM - HIDAwith non-vis of gallbladder -laparoscopic cholecystectomy canceled by anesthesia for pulmonary issues 6/29 - IR drain 6/30; IRparacentesis yielding 4.8 L of bilious appearing ascites- culture, cell count, bilirubin and cytology pending.  LEZ:VGJFT liquids/IV fluids ID: Vancomycin 6/24-6/26; cefepime 6/26 >> day 7 - WBC 45.6>>34.6>>29.8>>28.5>>25.8>>31.2 DVT: Lovenox on hold.  Plan: Advance diet, she was up to the chair yesterday and agreed to do that  again today and walk her this AM.  If she doesn't have a BM soon treat for constipation.    LOS: 8 days    Amber May 04/11/2020

## 2020-04-12 LAB — COMPREHENSIVE METABOLIC PANEL
ALT: 27 U/L (ref 0–44)
AST: 26 U/L (ref 15–41)
Albumin: 2.1 g/dL — ABNORMAL LOW (ref 3.5–5.0)
Alkaline Phosphatase: 88 U/L (ref 38–126)
Anion gap: 13 (ref 5–15)
BUN: 11 mg/dL (ref 6–20)
CO2: 23 mmol/L (ref 22–32)
Calcium: 8.2 mg/dL — ABNORMAL LOW (ref 8.9–10.3)
Chloride: 102 mmol/L (ref 98–111)
Creatinine, Ser: 0.59 mg/dL (ref 0.44–1.00)
GFR calc Af Amer: >60 mL/min (ref >60)
GFR calc non Af Amer: >60 mL/min (ref >60)
Glucose, Bld: 105 mg/dL — ABNORMAL HIGH (ref 70–99)
Potassium: 4.4 mmol/L (ref 3.5–5.1)
Sodium: 138 mmol/L (ref 135–145)
Total Bilirubin: 0.8 mg/dL (ref 0.3–1.2)
Total Protein: 5.2 g/dL — ABNORMAL LOW (ref 6.5–8.1)

## 2020-04-12 LAB — CBC
HCT: 40.6 % (ref 36.0–46.0)
Hemoglobin: 12.3 g/dL (ref 12.0–15.0)
MCH: 25.7 pg — ABNORMAL LOW (ref 26.0–34.0)
MCHC: 30.3 g/dL (ref 30.0–36.0)
MCV: 84.8 fL (ref 80.0–100.0)
Platelets: 368 10*3/uL (ref 150–400)
RBC: 4.79 MIL/uL (ref 3.87–5.11)
RDW: 15.6 % — ABNORMAL HIGH (ref 11.5–15.5)
WBC: 23.1 10*3/uL — ABNORMAL HIGH (ref 4.0–10.5)
nRBC: 0.4 % — ABNORMAL HIGH (ref 0.0–0.2)

## 2020-04-12 LAB — GLUCOSE, CAPILLARY
Glucose-Capillary: 97 mg/dL (ref 70–99)
Glucose-Capillary: 121 mg/dL — ABNORMAL HIGH (ref 70–99)
Glucose-Capillary: 107 mg/dL — ABNORMAL HIGH (ref 70–99)
Glucose-Capillary: 121 mg/dL — ABNORMAL HIGH (ref 70–99)

## 2020-04-12 NOTE — Progress Notes (Signed)
PHARMACY NOTE -  Cefepime  Pharmacy has been assisting with dosing of cefepime for IAI.  Dosage remains stable at 2g IV q8 hr and need for further dosage adjustment appears unlikely at present given baseline renal function  Pharmacy will sign off, following peripherally for culture results or dose adjustments. Please reconsult if a change in clinical status warrants re-evaluation of dosage.  Bernadene Person, PharmD, BCPS [phone] 04/12/2020, 10:40 AM

## 2020-04-12 NOTE — Progress Notes (Signed)
Referring Physician(s): Nat Christen  Supervising Physician: Gilmer Mor  Patient Status:  [facility] - In-pt  Chief Complaint: Abdominal pain/cholecystitis   Subjective: Patient currently sleeping; nurse reports some abdominal discomfort at drain site; also with significant output from drain; afebrile   Allergies: Patient has no known allergies.  Medications: Prior to Admission medications   Medication Sig Start Date End Date Taking? Authorizing Provider  levothyroxine (SYNTHROID) 125 MCG tablet Take 125 mcg by mouth daily before breakfast.   Yes [provider]     Vital Signs: BP 111/78 (BP Location: Right Arm)   Pulse 84   Temp 98.2 F (36.8 C) (Oral)   Resp (!) 23   Ht 5\' 2"  (1.575 m)   Wt (!) 330 lb 12.8 oz (150 kg)   SpO2 96%   BMI 60.50 kg/m   Physical Exam patient currently asleep; gallbladder drain intact, dressing dry, output 475cc slightly turbid greenish-yellow bile; drain flushed without difficulty  Imaging: IR Perc Cholecystostomy  Result Date: 04/08/2020 INDICATION: 39 year old morbidly obese Amber May with acute gallstone pancreatitis an additional clinical concerns for acute calculus cholecystitis given nonvisualization of gallbladder on HIDA scan. Of note, the gallbladder is not distended but is contracted and completely filled with stones. She is not currently a surgical candidate and presents for attempted percutaneous cholecystostomy tube placement. EXAM: IR PARACENTESIS; CHOLECYSTOSTOMY MEDICATIONS: In patient currently receiving intravenous meropenem. No additional antibiotic prophylaxis was administered. ANESTHESIA/SEDATION: Moderate (conscious) sedation was employed during this procedure. A total of Versed 8 mg and Fentanyl 200 mcg was administered intravenously. Moderate Sedation Time: 54 minutes. The patient's level of consciousness and vital signs were monitored continuously by radiology nursing throughout the procedure under my direct  supervision. FLUOROSCOPY TIME:  Fluoroscopy Time: 2 minutes 42 seconds (215 mGy). COMPLICATIONS: None immediate. PROCEDURE: Informed written consent was obtained from the patient after a thorough discussion of the procedural risks, benefits and alternatives. All questions were addressed. Maximal Sterile Barrier Technique was utilized including caps, mask, sterile gowns, sterile gloves, sterile drape, hand hygiene and skin antiseptic. A timeout was performed prior to the initiation of the procedure. The right upper quadrant was interrogated with ultrasound. There has been a significant increase in ascites compared to prior CT imaging. The liver is floating in ascites. Proceeding with placement of a percutaneous cholecystostomy tube in this setting is not ideal given the increased risk for bleeding and bile leak. Therefore, the decision was made to proceed with paracentesis to drain the fluid around the liver. 1% lidocaine with epinephrine was used for local anesthesia. Following this, a 6 Fr Safe-T-Centesis catheter was introduced. An ultrasound image was saved for documentation purposes. The paracentesis was performed. The catheter was removed and a dressing was applied. The patient tolerated the procedure well without immediate post procedural complication. Approximately 4800 mL of bilious ascites was successfully aspirated. Repeat ultrasound imaging demonstrates that the liver is again opposed to the abdominal wall. Therefore, a second skin entry site was selected. Local anesthesia was again attained by infiltration with 1% lidocaine. Under real-time ultrasound guidance, a 21 gauge Accustick needle was advanced along a short transhepatic course and into the stone filled gallbladder. A gentle hand injection of contrast material performed through the needle opacifies the stone filled gallbladder. There are innumerous filling defects throughout the gallbladder lumen. A 0.018 wire was successfully coiled in the  gallbladder fundus. The needle was exchanged for the Accustick transitional sheath which was advanced over the wire and into the gallbladder lumen. The 0.018  wire was then exchanged for a 0.035 Amplatz wire. The skin and transhepatic tract were dilated to 10 [place] and a Cook 10.2 [place] all-purpose drainage catheter was advanced over the wire and formed in the gallbladder fundus. Gentle contrast injection confirms that the tube is within the gallbladder. No evidence of filling of the cystic duct. The catheter was connected to gravity bag drainage and secured to the skin with 0 Prolene suture. IMPRESSION: 1. Significant interval increase in ascites compared to prior CT and MRI imaging. Paracentesis was required prior to cholecystostomy tube places mint. 2. Successful paracentesis yielding 4.8 L of bilious appearing ascites. 3. Successful placement of transhepatic percutaneous cholecystostomy tube for a diagnosis of acute calculus cholecystitis and gallstone pancreatitis. Signed, Sterling Big, MD, RPVI [facility] Radiology Electronically Signed   By: Malachy Moan M.D.   On: 04/08/2020 16:13   IR Paracentesis  Result Date: 04/08/2020 INDICATION: 39 year old morbidly obese Amber May with acute gallstone pancreatitis an additional clinical concerns for acute calculus cholecystitis given nonvisualization of gallbladder on HIDA scan. Of note, the gallbladder is not distended but is contracted and completely filled with stones. She is not currently a surgical candidate and presents for attempted percutaneous cholecystostomy tube placement. EXAM: IR PARACENTESIS; CHOLECYSTOSTOMY MEDICATIONS: In patient currently receiving intravenous meropenem. No additional antibiotic prophylaxis was administered. ANESTHESIA/SEDATION: Moderate (conscious) sedation was employed during this procedure. A total of Versed 8 mg and Fentanyl 200 mcg was administered intravenously.  Moderate Sedation Time: 54 minutes. The patient's level of consciousness and vital signs were monitored continuously by radiology nursing throughout the procedure under my direct supervision. FLUOROSCOPY TIME:  Fluoroscopy Time: 2 minutes 42 seconds (215 mGy). COMPLICATIONS: None immediate. PROCEDURE: Informed written consent was obtained from the patient after a thorough discussion of the procedural risks, benefits and alternatives. All questions were addressed. Maximal Sterile Barrier Technique was utilized including caps, mask, sterile gowns, sterile gloves, sterile drape, hand hygiene and skin antiseptic. A timeout was performed prior to the initiation of the procedure. The right upper quadrant was interrogated with ultrasound. There has been a significant increase in ascites compared to prior CT imaging. The liver is floating in ascites. Proceeding with placement of a percutaneous cholecystostomy tube in this setting is not ideal given the increased risk for bleeding and bile leak. Therefore, the decision was made to proceed with paracentesis to drain the fluid around the liver. 1% lidocaine with epinephrine was used for local anesthesia. Following this, a 6 Fr Safe-T-Centesis catheter was introduced. An ultrasound image was saved for documentation purposes. The paracentesis was performed. The catheter was removed and a dressing was applied. The patient tolerated the procedure well without immediate post procedural complication. Approximately 4800 mL of bilious ascites was successfully aspirated. Repeat ultrasound imaging demonstrates that the liver is again opposed to the abdominal wall. Therefore, a second skin entry site was selected. Local anesthesia was again attained by infiltration with 1% lidocaine. Under real-time ultrasound guidance, a 21 gauge Accustick needle was advanced along a short transhepatic course and into the stone filled gallbladder. A gentle hand injection of contrast material performed  through the needle opacifies the stone filled gallbladder. There are innumerous filling defects throughout the gallbladder lumen. A 0.018 wire was successfully coiled in the gallbladder fundus. The needle was exchanged for the Accustick transitional sheath which was advanced over the wire and into the gallbladder lumen. The 0.018 wire was then exchanged for a 0.035 Amplatz wire. The skin and transhepatic tract  were dilated to 10 [place] and a Cook 10.2 [place] all-purpose drainage catheter was advanced over the wire and formed in the gallbladder fundus. Gentle contrast injection confirms that the tube is within the gallbladder. No evidence of filling of the cystic duct. The catheter was connected to gravity bag drainage and secured to the skin with 0 Prolene suture. IMPRESSION: 1. Significant interval increase in ascites compared to prior CT and MRI imaging. Paracentesis was required prior to cholecystostomy tube places mint. 2. Successful paracentesis yielding 4.8 L of bilious appearing ascites. 3. Successful placement of transhepatic percutaneous cholecystostomy tube for a diagnosis of acute calculus cholecystitis and gallstone pancreatitis. Signed, Sterling Big, MD, RPVI [facility] Radiology Electronically Signed   By: Malachy Moan M.D.   On: 04/08/2020 16:13    Labs:  CBC: Recent Labs    04/09/20 0149 04/10/20 0240 04/11/20 0240 04/12/20 0528  WBC 25.8* 31.2* 30.3* 23.1*  HGB 12.4 11.8* 12.6 12.3  HCT 40.7 38.0 41.9 40.6  PLT 408* 364 391 368    COAGS: Recent Labs    04/02/20 1847 04/08/20 0132  INR  --  1.2  APTT 26  --     BMP: Recent Labs    04/09/20 0149 04/10/20 0240 04/11/20 0240 04/12/20 0528  NA 136 137 134* 138  K 4.4 4.6 4.2 4.4  CL 107 104 101 102  CO2 22 23 24 23   GLUCOSE 135* 128* 107* 105*  BUN 22* 15 13 11   CALCIUM 8.1* 8.3* 8.3* 8.2*  CREATININE 0.64 0.58 0.63 0.59  GFRNONAA >60 >60 >60 >60    GFRAA >60 >60 >60 >60    LIVER FUNCTION TESTS: Recent Labs    04/09/20 0149 04/10/20 0240 04/11/20 0240 04/12/20 0528  BILITOT 1.0 0.8 1.1 0.8  AST 44* 38 39 26  ALT 38 38 35 27  ALKPHOS 98 99 109 88  PROT 5.5* 5.5* 5.7* 5.2*  ALBUMIN 2.2* 2.1* 2.1* 2.1*    Assessment and Plan: Patient with history of acute gallstone pancreatitis as well as acute calculus cholecystitis, poor surgical candidate; status post percutaneous cholecystostomy as well as paracentesis on 6/30 yielding bilious ascites; afebrile, WBC 23.1 down from 13.3, hemoglobin stable, creatinine normal, total bilirubin /LFTs normal; ascitic fluid cytology negative for malignancy; ascitic fluid cultures; continue current treatment;  gallbladder drain will need to remain in place at least 4 to 6 weeks unless gallbladder removed surgically; consider follow-up CT abdomen/pelvis next week especially in light of bilious ascites noted during gallbladder drain placement/persistently elevated WBC.    Electronically Signed: D. 06/13/20, PA-C 04/12/2020, 3:01 PM   I spent a total of 15 minutes at the the patient's bedside AND on the patient's hospital floor or unit, greater than 50% of which was counseling/coordinating care for gallbladder drain    Patient ID: Amber May, Amber May   DOB: December 14, 1980, 39 y.o.   MRN: [id number]

## 2020-04-12 NOTE — Progress Notes (Signed)
Amber May  QZR:[id number] DOB: 24-Dec-1980 DOA: 04/02/2020 PCP: Patient, No Pcp Per   Brief Narrative:  39 year old woman PMH hypothyroidism, morbid obesity presented with abdominal pain and fever.  Admitted for severe acute pancreatitis, SIRS.  Treated with empiric antibiotics, aggressive IV fluids, seen by gastroenterology and general surgery.  Further work-up concerning for acute cholecystitis.  Condition slowly improving.  Cholecystectomy unable to be performed - IR performed cholecystostomy tube placement on 04/08/2020 with 4 L ascites drawn at time of procedure.  Assessment & Plan:   Principal Problem:   Acute pancreatitis Active Problems:   SIRS (systemic inflammatory response syndrome) (HCC)   AKI (acute kidney injury) (HCC)   Hypothyroidism   Hyperglycemia   Elevated liver enzymes   Acute respiratory failure with hypoxemia (HCC)   Acute cholecystitis   Hypocalcemia   Hypophosphatemia   Morbid obesity with BMI of 50.0-59.9, adult (HCC)   Cholelithiasis   Epigastric abdominal pain   Acute pancreatitis with severe clinical features including fever, acute hypoxic respiratory failure, tachypnea, lactic acidosis, leukocytosis, acute kidney injury, ascites, elevated LFTs.  - Presumed gallstone pancreatitis. Triglycerides without significant elevation.  No alcohol use.  Elevated LFTs on admission consistent with gallstone obstruction. - Leukocytosis stable in high 20s low 30s, remains afebrile since 04/05/20  - Continue empiric cefepime - cultures pending - preliminary negative - Tolerated paracentesis and cholecystostomy drain placement on 04/08/2020. 4.8 bilious ascites was drawn off and 46F drain was placed - Appreciate insight and recommendations from surgery and radiology -advance diet per surgery - Per IR - plan for repeat cholangiogram/possible exchange in 6-8 weeks if cholecystectomy does not occur in interim  Acute hypoxic respiratory failure,  multifactorial, resolving -Initially thought to be secondary to acute pancreatitis/cholecystitis, splinting from abdominal pain, and polypharmacy -Despite resolution of acute pancreatitis as above patient continues to have nighttime oxygen desaturation events -Patient likely has undiagnosed sleep apnea complicated by morbid obesity and probable chronic obesity hypoventilation syndrome. -Daily patient able to be weaned to room air, ambulates without hypoxia however overnight notable episodes of hypoxia requiring 2 L nasal cannula to maintain sats greater than 90% -Patient will likely need further evaluation outpatient follow-up with pulmonology for formal sleep study and likely fitting for CPAP/BiPAP   Acute cholecystitis, questionable.   - HIDA scan was abnormal as below. -Continue cefepime, status post percutaneous drainage with IR as above with adequate output and resolution of symptoms  Hypocalcemia, hypophosphatemia - Likely in the setting of poor p.o. intake -likely to improve with advancing diet as above  Acute kidney injury secondary to poor p.o. intake and acute pancreatitis, resolved - Continue to increase p.o. intake as tolerated post procedure - IVF now stopped Lab Results  Component Value Date   CREATININE 0.59 04/12/2020   CREATININE 0.63 04/11/2020   CREATININE 0.58 04/10/2020   Hyperglycemia - Likely secondary to acute pancreatitis.  Hemoglobin A1c 5.4. - Overall mild in nature.  Continue sliding scale insulin.  Hepatomegaly and hepatic steatosis.   - Likely related to morbid obesity/diet - Follow-up as an outpatient with gastroenterology.  Abnormal low-density in the lower uterine segment/cervix spanning 18 mm, nonspecific. Recommend nonemergent pelvic ultrasound for characterization on an elective outpatient basis.  Morbid obesity. Body mass index is 62.1 kg/m. --Dietitian consult   DVT prophylaxis: SCDs periprocedure Code Status: Full Family  Communication: None present Status is: Inpatient Dispo: The patient is from: Home  Anticipated d/c is to: Home              Anticipated d/c date is: 24-48h              Patient currently NOT medically stable for discharge due to ongoing need for close monitoring post procedure, IV antibiotics, and awaiting cultures  Consultants:   Gen surgery, IR  Procedures:   - Paracentesis yielding 4.8L bilious ascites.   - Placement of transhepatic 60F perc cholecystostomy tube.    Antimicrobials:  Cefepime   Subjective: No acute issues or events overnight, family at bedside, denies nausea, vomiting, diarrhea, headache, fevers and chills.  She reports bowel movement today, constipation resolving, denies abdominal pain.  Objective: Vitals:   04/11/20 1744 04/11/20 2047 04/12/20 0358 04/12/20 0500  BP: 102/80 116/62 (!) 99/57   Pulse: (!) 101 98 92   Resp:  18 20   Temp:  98.8 F (37.1 C) 97.7 F (36.5 C)   TempSrc:  Oral Oral   SpO2: 93% 90% 95%   Weight:    (!) 150 kg  Height:        Intake/Output Summary (Last 24 hours) at 04/12/2020 0802 Last data filed at 04/11/2020 2000 Gross per 24 hour  Intake 690 ml  Output 40 ml  Net 650 ml   Filed Weights   04/09/20 0451 04/10/20 0444 04/12/20 0500  Weight: (!) 149.1 kg (!) 148.8 kg (!) 150 kg    Examination:  General:  Pleasantly resting in bed, No acute distress. HEENT:  Normocephalic atraumatic.  Sclerae nonicteric, noninjected.  Extraocular movements intact bilaterally. Neck:  Without mass or deformity.  Trachea is midline. Lungs:  Clear to auscultate bilaterally without rhonchi, wheeze, or rales. Heart:  Regular rate and rhythm.  Without murmurs, rubs, or gallops. Abdomen:  Soft, obese, right upper quadrant drain in place draining scant amount of amber fluid, bandage clean dry intact. Extremities: Without cyanosis, clubbing, edema, or obvious deformity. Vascular:  Dorsalis pedis and posterior tibial pulses palpable  bilaterally. Skin:  Warm and dry, no erythema, no ulcerations.   Data Reviewed: I have personally reviewed following labs and imaging studies  CBC: Recent Labs  Lab 04/06/20 0340 04/07/20 0258 04/08/20 0132 04/09/20 0149 04/10/20 0240 04/11/20 0240 04/12/20 0528  WBC 28.2*   < > 28.1* 25.8* 31.2* 30.3* 23.1*  NEUTROABS 22.2*  --   --   --   --   --   --   HGB 13.3   < > 13.1 12.4 11.8* 12.6 12.3  HCT 44.3   < > 42.9 40.7 38.0 41.9 40.6  MCV 86.9   < > 84.6 84.8 84.8 85.7 84.8  PLT 402*   < > 424* 408* 364 391 368   < > = values in this interval not displayed.   Basic Metabolic Panel: Recent Labs  Lab 04/06/20 0340 04/07/20 0258 04/08/20 0132 04/09/20 0149 04/10/20 0240 04/11/20 0240 04/12/20 0528  NA 138   < > 135 136 137 134* 138  K 4.4   < > 4.7 4.4 4.6 4.2 4.4  CL 106   < > 104 107 104 101 102  CO2 19*   < > 21* GLUCOSE 137*   < > 125* 135* 128* 107* 105*  BUN 20   < > 23* 22* CREATININE 0.79   < > 0.69 0.64 0.58 0.63 0.59  CALCIUM 7.9*   < > 8.1* 8.1* 8.3* 8.3* 8.2*  MG 2.4  --  2.7*  --   --   --   --   PHOS 2.0*  --  3.0  --   --   --   --    < > = values in this interval not displayed.   GFR: Estimated Creatinine Clearance: 131.6 mL/min (by C-G formula based on SCr of 0.59 mg/dL). Liver Function Tests: Recent Labs  Lab 04/06/20 0340 04/09/20 0149 04/10/20 0240 04/11/20 0240 04/12/20 0528  AST 17 44* 38 39 26  ALT 29 38 38 35 27  ALKPHOS 83 98 99 109 88  BILITOT 1.2 1.0 0.8 1.1 0.8  PROT 6.2* 5.5* 5.5* 5.7* 5.2*  ALBUMIN 2.4* 2.2* 2.1* 2.1* 2.1*   No results for input(s): LIPASE, AMYLASE in the last 168 hours. No results for input(s): AMMONIA in the last 168 hours. Coagulation Profile: Recent Labs  Lab 04/08/20 0132  INR 1.2   Cardiac Enzymes: No results for input(s): CKTOTAL, CKMB, CKMBINDEX, TROPONINI in the last 168 hours. BNP (last 3 results) No results for input(s): PROBNP in the last 8760 hours. HbA1C: No  results for input(s): HGBA1C in the last 72 hours. CBG: Recent Labs  Lab 04/11/20 0618 04/11/20 1604 04/11/20 1646 04/11/20 2117 04/12/20 0714  GLUCAP 116* 131* 129* 136* 97   Lipid Profile: No results for input(s): CHOL, HDL, LDLCALC, TRIG, CHOLHDL, LDLDIRECT in the last 72 hours. Thyroid Function Tests: No results for input(s): TSH, T4TOTAL, FREET4, T3FREE, THYROIDAB in the last 72 hours. Anemia Panel: No results for input(s): VITAMINB12, FOLATE, FERRITIN, TIBC, IRON, RETICCTPCT in the last 72 hours. Sepsis Labs: Recent Labs  Lab 04/07/20 0554  LATICACIDVEN 2.0*    Recent Results (from the past 240 hour(s))  Blood Culture (routine x 2)     Status: None   Collection Time: 04/02/20  6:45 PM   Specimen: BLOOD  Result Value Ref Range Status   Specimen Description   Final    BLOOD LEFT ANTECUBITAL Performed at [facility] [place], [address] [place], [place] [address]    Special Requests   Final    BOTTLES DRAWN AEROBIC AND ANAEROBIC Blood Culture results may not be optimal due to an excessive volume of blood received in culture bottles Performed at [facility] [place], [address] [place], [place] [address]    Culture   Final    NO GROWTH 5 DAYS Performed at [facility], [address] [place], [place] [address]    Report Status 04/07/2020 FINAL  Final  Urine culture     Status: Abnormal   Collection Time: 04/02/20  6:47 PM   Specimen: In/Out Cath Urine  Result Value Ref Range Status   Specimen Description   Final    IN/OUT CATH URINE Performed at [facility] [place], [address] [place], [place] [address]    Special Requests   Final    NONE Performed at [facility] [place], [address] [place], [place] [address]    Culture >=100,000 COLONIES/mL ESCHERICHIA COLI (A)  Final   Report Status 04/05/2020 FINAL  Final   Organism ID, Bacteria ESCHERICHIA COLI (A)  Final      Susceptibility   Escherichia coli - MIC*     AMPICILLIN 8 SENSITIVE Sensitive     CEFAZOLIN <=4 SENSITIVE Sensitive     CEFTRIAXONE <=0.25 SENSITIVE Sensitive     CIPROFLOXACIN <=0.25 SENSITIVE Sensitive     GENTAMICIN <=1 SENSITIVE  Sensitive     IMIPENEM <=0.25 SENSITIVE Sensitive     NITROFURANTOIN <=16 SENSITIVE Sensitive     TRIMETH/SULFA <=20 SENSITIVE Sensitive     AMPICILLIN/SULBACTAM 4 SENSITIVE Sensitive     PIP/TAZO <=4 SENSITIVE Sensitive     * >=100,000 COLONIES/mL ESCHERICHIA COLI  Blood Culture (routine x 2)     Status: None   Collection Time: 04/02/20  7:00 PM   Specimen: BLOOD  Result Value Ref Range Status   Specimen Description   Final    BLOOD BLOOD RIGHT HAND Performed at [facility], [address] [place], [place] [address]    Special Requests   Final    BOTTLES DRAWN AEROBIC ONLY Blood Culture results may not be optimal due to an inadequate volume of blood received in culture bottles Performed at [facility], [address] [place], [place] [address]    Culture   Final    NO GROWTH 5 DAYS Performed at [facility], [address], [place], [place] [address]    Report Status 04/07/2020 FINAL  Final  SARS Coronavirus 2 by RT PCR (hospital order, performed in [facility] hospital lab) Nasopharyngeal Nasopharyngeal Swab     Status: None   Collection Time: 04/02/20 11:13 PM   Specimen: Nasopharyngeal Swab  Result Value Ref Range Status   SARS Coronavirus 2 NEGATIVE NEGATIVE Final    Comment: (NOTE) SARS-CoV-2 target nucleic acids are NOT DETECTED.  The SARS-CoV-2 RNA is generally detectable in upper and lower respiratory specimens during the acute phase of infection. The lowest concentration of SARS-CoV-2 viral copies this assay can detect is 250 copies / mL. A negative result does not preclude SARS-CoV-2 infection and should not be used as the sole basis for treatment or other patient management decisions.  A negative result may occur with improper specimen collection  / handling, submission of specimen other than nasopharyngeal swab, presence of viral mutation(s) within the areas targeted by this assay, and inadequate number of viral copies (<250 copies / mL). A negative result must be combined with clinical observations, patient history, and epidemiological information.  Fact Sheet for Patients:   BoilerBrush.com.cy  Fact Sheet for Healthcare Providers: https://pope.com/  This test is not yet approved or  cleared by the [place] FDA and has been authorized for detection and/or diagnosis of SARS-CoV-2 by FDA under an Emergency Use Authorization (EUA).  This EUA will remain in effect (meaning this test can be used) for the duration of the COVID-19 declaration under Section 564(b)(1) of the Act, 21 U.S.C. section 360bbb-3(b)(1), unless the authorization is terminated or revoked sooner.  Performed at [facility], [address] [place], [place] [address]   MRSA PCR Screening     Status: None   Collection Time: 04/04/20  9:08 AM   Specimen: Nasal Mucosa; Nasopharyngeal  Result Value Ref Range Status   MRSA by PCR NEGATIVE NEGATIVE Final    Comment:        The GeneXpert MRSA Assay (FDA approved for NASAL specimens only), is one component of a comprehensive MRSA colonization surveillance program. It is not intended to diagnose MRSA infection nor to guide or monitor treatment for MRSA infections. Performed at [facility], [address] [place], [place] [address]   Body fluid culture     Status: None   Collection Time: 04/08/20  3:18 PM   Specimen: Peritoneal Cavity; Body Fluid  Result Value Ref Range Status  Specimen Description   Final    PERITONEAL CAVITY Performed at [facility], [address], [place], [place] [address]    Special Requests   Final    Normal Performed at [facility], [address],  [place], [place] [address]    Gram Stain NO WBC SEEN NO ORGANISMS SEEN   Final   Culture   Final    NO GROWTH 3 DAYS Performed at [facility], [address] [place], [place] [address]    Report Status 04/11/2020 FINAL  Final         Radiology Studies: No results found. Scheduled Meds: . Chlorhexidine Gluconate Cloth  6 each Topical Daily  . enoxaparin (LOVENOX) injection  75 mg Subcutaneous Q24H  . insulin aspart  0-6 Units Subcutaneous TID WC  . levothyroxine  75 mcg Oral Q0600  . mouth rinse  15 mL Mouth Rinse BID  . morphine      . polyethylene glycol  17 g Oral Daily  . sodium chloride flush  10-40 mL Intracatheter Q12H  . sodium chloride flush  5 mL Intracatheter Q8H   Continuous Infusions: . sodium chloride Stopped (04/05/20 1702)  . ceFEPime (MAXIPIME) IV 2 g (04/12/20 5284)     LOS: 9 days   Time spent:  Azucena Fallen, DO [facility]  If 7PM-7AM, please contact night-coverage [email or web address]  04/12/2020, 8:02 AM

## 2020-04-12 NOTE — Progress Notes (Signed)
5 Days Post-Op   Subjective/Chief Complaint: Abd pain improving, tolerating some clears   Objective: Vital signs in last 24 hours: Temp:  [97.7 F (36.5 C)-98.8 F (37.1 C)] 97.7 F (36.5 C) (07/04 0358) Pulse Rate:  [88-101] 92 (07/04 0358) Resp:  [14-20] 20 (07/04 0358) BP: (90-133)/(40-80) 99/57 (07/04 0358) SpO2:  [90 %-95 %] 95 % (07/04 0358) Weight:  [150 kg] 150 kg (07/04 0500) Last BM Date:  (PTA)  Intake/Output from previous day: 07/03 0701 - 07/04 0700 In: 690 [P.O.:480; IV Piggyback:200] Out: 40 [Drains:40] Intake/Output this shift: No intake/output data recorded.  General appearance: alert and cooperative GI: soft, mild RUQ pain. not much in drain bag  Lab Results:  Recent Labs    04/11/20 0240 04/12/20 0528  WBC 30.3* 23.1*  HGB 12.6 12.3  HCT 41.9 40.6  PLT 391 368   BMET Recent Labs    04/11/20 0240 04/12/20 0528  NA 134* 138  K 4.2 4.4  CL 101 102  CO2 24 23  GLUCOSE 107* 105*  BUN 13 11  CREATININE 0.63 0.59  CALCIUM 8.3* 8.2*   PT/INR No results for input(s): LABPROT, INR in the last 72 hours. ABG No results for input(s): PHART, HCO3 in the last 72 hours.  Invalid input(s): PCO2, PO2  Studies/Results: No results found.  Anti-infectives: Anti-infectives (From admission, onward)   Start     Dose/Rate Route Frequency Ordered Stop   04/04/20 1300  vancomycin (VANCOREADY) IVPB 1500 mg/300 mL  Status:  Discontinued        1,500 mg 150 mL/hr over 120 Minutes Intravenous Every 12 hours 04/04/20 1014 04/06/20 1157   04/03/20 2000  vancomycin (VANCOREADY) IVPB 1750 mg/350 mL  Status:  Discontinued        1,750 mg 175 mL/hr over 120 Minutes Intravenous Every 24 hours 04/02/20 1937 04/03/20 1157   04/03/20 1400  ceFEPIme (MAXIPIME) 2 g in sodium chloride 0.9 % 100 mL IVPB     Discontinue     2 g 200 mL/hr over 30 Minutes Intravenous Every 8 hours 04/03/20 1157     04/03/20 1300  vancomycin (VANCOREADY) IVPB 1250 mg/250 mL  Status:   Discontinued        1,250 mg 166.7 mL/hr over 90 Minutes Intravenous Every 12 hours 04/03/20 1157 04/04/20 1014   04/03/20 0600  ceFEPIme (MAXIPIME) 2 g in sodium chloride 0.9 % 100 mL IVPB  Status:  Discontinued        2 g 200 mL/hr over 30 Minutes Intravenous Every 12 hours 04/02/20 1937 04/03/20 1157   04/02/20 1930  vancomycin (VANCOCIN) IVPB 1000 mg/200 mL premix        1,000 mg 200 mL/hr over 60 Minutes Intravenous Every hour 04/02/20 1900 04/02/20 2129   04/02/20 1845  ceFEPIme (MAXIPIME) 2 g in sodium chloride 0.9 % 100 mL IVPB        2 g 200 mL/hr over 30 Minutes Intravenous  Once 04/02/20 1840 04/02/20 1944   04/02/20 1845  metroNIDAZOLE (FLAGYL) IVPB 500 mg        500 mg 100 mL/hr over 60 Minutes Intravenous  Once 04/02/20 1840 04/02/20 2228   04/02/20 1845  vancomycin (VANCOCIN) IVPB 1000 mg/200 mL premix  Status:  Discontinued        1,000 mg 200 mL/hr over 60 Minutes Intravenous  Once 04/02/20 1840 04/02/20 1900      Assessment/Plan: s/p Procedure(s): LAPAROSCOPIC CHOLECYSTECTOMY WITH INTRAOPERATIVE CHOLANGIOGRAM (N/A) Cont clears until tolerating better Acute  hypoxic respiratory failure-multifactorial AKI E coli UTI Hyperglycemia Hepatomegaly/hepatic steatosis Morbid obesity BMI 61.9   Acute pancreatitis, cholelithiasis,cholecystitis  -Tbili normalized and LFTs within normal limits -MRCP 6/25 negative for choledocholithiasis - WBC slowly improving, 23K this AM - HIDAwith non-vis of gallbladder -laparoscopic cholecystectomy canceled by anesthesia for pulmonary issues 6/29 - IR drain 6/30; IRparacentesis yielding 4.8 L of bilious appearing ascites- culture, cell count, bilirubin and cytology pending.  GMW:NUUVO liquids/IV fluids ID: Vancomycin 6/24-6/26; cefepime 6/26 >> day 8 DVT: Lovenox   Plan: Ambulate.  Cont clears.  If she doesn't have a BM soon treat for constipation.    LOS: 9 days    [name] 04/12/2020

## 2020-04-13 LAB — GLUCOSE, CAPILLARY
Glucose-Capillary: 98 mg/dL (ref 70–99)
Glucose-Capillary: 108 mg/dL — ABNORMAL HIGH (ref 70–99)
Glucose-Capillary: 97 mg/dL (ref 70–99)
Glucose-Capillary: 134 mg/dL — ABNORMAL HIGH (ref 70–99)

## 2020-04-13 LAB — COMPREHENSIVE METABOLIC PANEL
ALT: 24 U/L (ref 0–44)
AST: 26 U/L (ref 15–41)
Albumin: 2.1 g/dL — ABNORMAL LOW (ref 3.5–5.0)
Alkaline Phosphatase: 81 U/L (ref 38–126)
Anion gap: 9 (ref 5–15)
BUN: 10 mg/dL (ref 6–20)
CO2: 25 mmol/L (ref 22–32)
Calcium: 8.2 mg/dL — ABNORMAL LOW (ref 8.9–10.3)
Chloride: 102 mmol/L (ref 98–111)
Creatinine, Ser: 0.59 mg/dL (ref 0.44–1.00)
GFR calc Af Amer: >60 mL/min (ref >60)
GFR calc non Af Amer: >60 mL/min (ref >60)
Glucose, Bld: 105 mg/dL — ABNORMAL HIGH (ref 70–99)
Potassium: 4.1 mmol/L (ref 3.5–5.1)
Sodium: 136 mmol/L (ref 135–145)
Total Bilirubin: 0.6 mg/dL (ref 0.3–1.2)
Total Protein: 5 g/dL — ABNORMAL LOW (ref 6.5–8.1)

## 2020-04-13 LAB — CBC
HCT: 38.4 % (ref 36.0–46.0)
Hemoglobin: 11.6 g/dL — ABNORMAL LOW (ref 12.0–15.0)
MCH: 25.5 pg — ABNORMAL LOW (ref 26.0–34.0)
MCHC: 30.2 g/dL (ref 30.0–36.0)
MCV: 84.4 fL (ref 80.0–100.0)
Platelets: 327 10*3/uL (ref 150–400)
RBC: 4.55 MIL/uL (ref 3.87–5.11)
RDW: 15.6 % — ABNORMAL HIGH (ref 11.5–15.5)
WBC: 21.9 10*3/uL — ABNORMAL HIGH (ref 4.0–10.5)
nRBC: 0.4 % — ABNORMAL HIGH (ref 0.0–0.2)

## 2020-04-13 NOTE — Progress Notes (Signed)
Amber NOTE    Amber May  NOB:[id number] DOB: [birth year] DOA: 04/02/2020 PCP: Patient, No Pcp Per   Brief Narrative:  39 year old woman PMH hypothyroidism, morbid obesity presented with abdominal pain and fever.  Admitted for severe acute pancreatitis, SIRS.  Treated with empiric antibiotics, aggressive IV fluids, seen by gastroenterology and general surgery.  Further work-up concerning for acute cholecystitis.  Condition slowly improving.  Cholecystectomy unable to be performed - IR performed cholecystostomy tube placement on 04/08/2020 with 4 L ascites drawn at time of procedure.  Assessment & Plan:   Principal Problem:   Acute pancreatitis Active Problems:   SIRS (systemic inflammatory response syndrome) (HCC)   AKI (acute kidney injury) (HCC)   Hypothyroidism   Hyperglycemia   Elevated liver enzymes   Acute respiratory failure with hypoxemia (HCC)   Acute cholecystitis   Hypocalcemia   Hypophosphatemia   Morbid obesity with BMI of 50.0-59.9, adult (HCC)   Cholelithiasis   Epigastric abdominal pain  Acute pancreatitis with severe clinical features including fever, acute hypoxic respiratory failure, tachypnea, lactic acidosis, leukocytosis, acute kidney injury, ascites, elevated LFTs.  - Presumed gallstone pancreatitis. Triglycerides without significant elevation.  No alcohol use.  Elevated LFTs on admission consistent with gallstone obstruction. - Leukocytosis stable in high 20s low 30s, remains afebrile since 04/05/20  - Continue empiric cefepime - cultures pending - preliminary negative - Tolerated paracentesis and cholecystostomy drain placement on 04/08/2020. 4.8 bilious ascites was drawn off and 27F drain was placed - Appreciate insight and recommendations from surgery and radiology -advance diet per surgery - Per IR - plan for repeat cholangiogram/possible exchange in 6-8 weeks if cholecystectomy does not occur in interim  Acute hypoxic respiratory failure,  multifactorial, resolving -Initially thought to be secondary to acute pancreatitis/cholecystitis, splinting from abdominal pain, and polypharmacy -Despite resolution of acute pancreatitis as above patient continues to have nighttime oxygen desaturation events -Patient likely has undiagnosed sleep apnea complicated by morbid obesity and probable chronic obesity hypoventilation syndrome. -Daily patient able to be weaned to room air, ambulates without hypoxia however overnight notable episodes of hypoxia requiring 2 L nasal cannula to maintain sats greater than 90% -Patient will likely need further evaluation outpatient follow-up with pulmonology for formal sleep study and likely fitting for CPAP/BiPAP   Acute cholecystitis, questionable.   - HIDA scan was abnormal as below. -Continue cefepime, status post percutaneous drainage with IR as above with adequate output and resolution of symptoms  Hypocalcemia, hypophosphatemia - Likely in the setting of poor p.o. intake -likely to improve with advancing diet as above  Acute kidney injury secondary to poor p.o. intake and acute pancreatitis, resolved - Continue to increase p.o. intake as tolerated post procedure - IVF now stopped Lab Results  Component Value Date   CREATININE 0.59 04/13/2020   CREATININE 0.59 04/12/2020   CREATININE 0.63 04/11/2020   Hyperglycemia - Likely secondary to acute pancreatitis.  Hemoglobin A1c 5.4. - Overall mild in nature.  Continue sliding scale insulin.  Ambulatory dysfunction   - Likely setting of obesity, post procedure and prolonged nonambulatory status while in bed perioperatively   - PT continues to recommend 24-hour supervision at home with home health versus SNF.  Due to discussed with case management and family over the next 24 hours  Hepatomegaly and hepatic steatosis.   - Likely related to morbid obesity/diet - Follow-up as an outpatient with gastroenterology.  Abnormal low-density in the lower  uterine segment/cervix spanning 18 mm, nonspecific. Recommend nonemergent pelvic ultrasound for characterization on  an elective outpatient basis.  Morbid obesity. Body mass index is 62.1 kg/m. --Dietitian consult   DVT prophylaxis: SCDs periprocedure Code Status: Full Family Communication: None present Status is: Inpatient Dispo: The patient is from: Home              Anticipated d/c is to: To be determined, home with 24-hour care versus SNF              Anticipated d/c date is: 24-48h              Patient currently NOT medically stable for discharge due to ongoing need for close monitoring post procedure, IV antibiotics, and awaiting cultures and requires ongoing physical therapy in the setting of ambulatory dysfunction  Consultants:   Gen surgery, IR  Procedures:   - Paracentesis yielding 4.8L bilious ascites.   - Placement of transhepatic 52F perc cholecystostomy tube.    Antimicrobials:  Cefepime   Subjective: No acute issues or events overnight, family at bedside, denies nausea, vomiting, diarrhea, headache, fevers and chills.  She reports bowel movement today, constipation resolving, denies abdominal pain.  Objective: Vitals:   04/12/20 1256 04/12/20 1319 04/12/20 2132 04/13/20 0644  BP: 107/78 111/78 121/66 114/68  Pulse: 98 84 96 89  Resp: (!) 22 (!) 23 16 20   Temp: (!) 97.5 F (36.4 C) 98.2 F (36.8 C) 98.2 F (36.8 C) 98 F (36.7 C)  TempSrc: Oral Oral Oral Oral  SpO2: 92% 96% 92% 92%  Weight:      Height:        Intake/Output Summary (Last 24 hours) at 04/13/2020 0751 Last data filed at 04/13/2020 0640 Gross per 24 hour  Intake 798.38 ml  Output 750 ml  Net 48.38 ml   Filed Weights   04/09/20 0451 04/10/20 0444 04/12/20 0500  Weight: (!) 149.1 kg (!) 148.8 kg (!) 150 kg    Examination:  General:  Pleasantly resting in bed, No acute distress. HEENT:  Normocephalic atraumatic.  Sclerae nonicteric, noninjected.  Extraocular movements intact  bilaterally. Neck:  Without mass or deformity.  Trachea is midline. Lungs:  Clear to auscultate bilaterally without rhonchi, wheeze, or rales. Heart:  Regular rate and rhythm.  Without murmurs, rubs, or gallops. Abdomen:  Soft, obese, right upper quadrant drain in place draining scant amount of amber fluid, bandage clean dry intact. Extremities: Without cyanosis, clubbing, edema, or obvious deformity. Vascular:  Dorsalis pedis and posterior tibial pulses palpable bilaterally. Skin:  Warm and dry, no erythema, no ulcerations.   Data Reviewed: I have personally reviewed following labs and imaging studies  CBC: Recent Labs  Lab 04/09/20 0149 04/10/20 0240 04/11/20 0240 04/12/20 0528 04/13/20 0458  WBC 25.8* 31.2* 30.3* 23.1* 21.9*  HGB 12.4 11.8* 12.6 12.3 11.6*  HCT 40.7 38.0 41.9 40.6 38.4  MCV 84.8 84.8 85.7 84.8 84.4  PLT 408* 364 391 368 327   Basic Metabolic Panel: Recent Labs  Lab 04/08/20 0132 04/08/20 0132 04/09/20 0149 04/10/20 0240 04/11/20 0240 04/12/20 0528 04/13/20 0458  NA 135   < > 136 137 134* 138 136  K 4.7   < > 4.4 4.6 4.2 4.4 4.1  CL 104   < > 107 104 101 102 102  CO2 21*   < > 22 23 24 23 25   GLUCOSE 125*   < > 135* 128* 107* 105* 105*  BUN 23*   < > 22* 15 13 11 10   CREATININE 0.69   < > 0.64 0.58 0.63 0.59  0.59  CALCIUM 8.1*   < > 8.1* 8.3* 8.3* 8.2* 8.2*  MG 2.7*  --   --   --   --   --   --   PHOS 3.0  --   --   --   --   --   --    < > = values in this interval not displayed.   GFR: Estimated Creatinine Clearance: 131.6 mL/min (by C-G formula based on SCr of 0.59 mg/dL). Liver Function Tests: Recent Labs  Lab 04/09/20 0149 04/10/20 0240 04/11/20 0240 04/12/20 0528 04/13/20 0458  AST 44* 38 39 26 26  ALT 38 38 35 27 24  ALKPHOS 98 99 109 88 81  BILITOT 1.0 0.8 1.1 0.8 0.6  PROT 5.5* 5.5* 5.7* 5.2* 5.0*  ALBUMIN 2.2* 2.1* 2.1* 2.1* 2.1*   No results for input(s): LIPASE, AMYLASE in the last 168 hours. No results for input(s):  AMMONIA in the last 168 hours. Coagulation Profile: Recent Labs  Lab 04/08/20 0132  INR 1.2   Cardiac Enzymes: No results for input(s): CKTOTAL, CKMB, CKMBINDEX, TROPONINI in the last 168 hours. BNP (last 3 results) No results for input(s): PROBNP in the last 8760 hours. HbA1C: No results for input(s): HGBA1C in the last 72 hours. CBG: Recent Labs  Lab 04/12/20 0714 04/12/20 1151 04/12/20 1631 04/12/20 2131 04/13/20 0732  GLUCAP 97 121* 107* 121* 98   Lipid Profile: No results for input(s): CHOL, HDL, LDLCALC, TRIG, CHOLHDL, LDLDIRECT in the last 72 hours. Thyroid Function Tests: No results for input(s): TSH, T4TOTAL, FREET4, T3FREE, THYROIDAB in the last 72 hours. Anemia Panel: No results for input(s): VITAMINB12, FOLATE, FERRITIN, TIBC, IRON, RETICCTPCT in the last 72 hours. Sepsis Labs: Recent Labs  Lab 04/07/20 0554  LATICACIDVEN 2.0*    Recent Results (from the past 240 hour(s))  MRSA PCR Screening     Status: None   Collection Time: 04/04/20  9:08 AM   Specimen: Nasal Mucosa; Nasopharyngeal  Result Value Ref Range Status   MRSA by PCR NEGATIVE NEGATIVE Final    Comment:        The GeneXpert MRSA Assay (FDA approved for NASAL specimens only), is one component of a comprehensive MRSA colonization surveillance program. It is not intended to diagnose MRSA infection nor to guide or monitor treatment for MRSA infections. Performed at [facility], [address] [place], [place] [address]   Body fluid culture     Status: None   Collection Time: 04/08/20  3:18 PM   Specimen: Peritoneal Cavity; Body Fluid  Result Value Ref Range Status   Specimen Description   Final    PERITONEAL CAVITY Performed at [facility], [address], [place], [place] [address]    Special Requests   Final    Normal Performed at [facility], [address] [place], [place] [address]    Gram Stain NO WBC SEEN NO  ORGANISMS SEEN   Final   Culture   Final    NO GROWTH 3 DAYS Performed at [facility], [address], [place], [place] [address]    Report Status 04/11/2020 FINAL  Final         Radiology Studies: No results found. Scheduled Meds: . Chlorhexidine Gluconate Cloth  6 each Topical Daily  . enoxaparin (LOVENOX) injection  75 mg Subcutaneous Q24H  . insulin aspart  0-6 Units Subcutaneous TID WC  . levothyroxine  75 mcg Oral Q0600  .  mouth rinse  15 mL Mouth Rinse BID  . morphine      . polyethylene glycol  17 g Oral Daily  . sodium chloride flush  10-40 mL Intracatheter Q12H  . sodium chloride flush  5 mL Intracatheter Q8H   Continuous Infusions: . sodium chloride Stopped (04/05/20 1702)  . ceFEPime (MAXIPIME) IV 2 g (04/13/20 3343)     LOS: 10 days   Time spent:  Azucena Fallen, DO [facility]  If 7PM-7AM, please contact night-coverage [email or web address]  04/13/2020, 7:51 AM

## 2020-04-13 NOTE — Progress Notes (Signed)
Physical Therapy Treatment Patient Details Name: Amber May MRN: [id number] DOB: 09/17/1981 Today's Date: 04/13/2020    History of Present Illness 39 year old woman PMH hypothyroidism, morbid obesity presented with abdominal pain and fever and admitted for acute pancreatitis with severe clinical features including fever, acute hypoxic respiratory failure, tachypnea, lactic acidosis, leukocytosis, acute kidney injury, ascites, elevated LFTs.Acute calculus cholecystitis s/p percutaneous cholecystostomy tube placement in IR 04/08/2020    PT Comments    Progressing slowly. Pt fatigues very quickly with ambulation. O2 90% on RA with activity. Will continue to follow and progress activity as tolerated.  Family present-reports pt will have 24/7 supervision/assist at home.   Follow Up Recommendations  Home health PT;Supervision/Assistance - 24 hour;SNF     Equipment Recommendations  Rolling walker with 5" wheels    Recommendations for Other Services       Precautions / Restrictions Precautions Precaution Comments: R side drain Restrictions Weight Bearing Restrictions: No    Mobility  Bed Mobility Overal bed mobility: Needs Assistance Bed Mobility: Sit to Supine       Sit to supine: Min guard;HOB elevated      Transfers Overall transfer level: Needs assistance Equipment used: 4-wheeled walker (bari) Transfers: Sit to/from Stand Sit to Stand: Min guard;From elevated surface         General transfer comment: Increased time. Cues for hand placement  Ambulation/Gait Ambulation/Gait assistance: Min guard Gait Distance (Feet): 25 Feet Assistive device: 4-wheeled walker (bari) Gait Pattern/deviations: Step-through pattern;Decreased stride length     General Gait Details: fatigues quickly. O2 90% on RA   Stairs             Wheelchair Mobility    Modified Rankin (Stroke Patients Only)       Balance Overall balance assessment: Needs assistance          Standing balance support: Bilateral upper extremity supported Standing balance-Leahy Scale: Fair                              Cognition Arousal/Alertness: Awake/alert Behavior During Therapy: Flat affect Overall Cognitive Status: Within Functional Limits for tasks assessed                                        Exercises      General Comments        Pertinent Vitals/Pain Pain Assessment: Faces Faces Pain Scale: Hurts even more Pain Location: R side/abdomen Pain Descriptors / Indicators: Discomfort;Sore Pain Intervention(s): Monitored during session    Home Living                      Prior Function            PT Goals (current goals can now be found in the care plan section) Progress towards PT goals: Progressing toward goals    Frequency    Min 3X/week      PT Plan Current plan remains appropriate    Co-evaluation              AM-PAC PT "6 Clicks" Mobility   Outcome Measure  Help needed turning from your back to your side while in a flat bed without using bedrails?: A Little Help needed moving from lying on your back to sitting on the side of a flat bed without using bedrails?: A Little Help  needed moving to and from a bed to a chair (including a wheelchair)?: A Little Help needed standing up from a chair using your arms (e.g., wheelchair or bedside chair)?: A Little Help needed to walk in hospital room?: A Little Help needed climbing 3-5 steps with a railing? : A Little 6 Click Score: 18    End of Session Equipment Utilized During Treatment: Gait belt Activity Tolerance: Patient limited by fatigue Patient left: in bed;with call bell/phone within reach;with family/visitor present;with bed alarm set   PT Visit Diagnosis: Difficulty in walking, not elsewhere classified (R26.2)     Time: 1607-3710 PT Time Calculation (min) (ACUTE ONLY): 12 min  Charges:  $Gait Training: 8-22 mins                         Faye Ramsay, PT Acute Rehabilitation  Office: [phone] Pager: [phone]

## 2020-04-14 ENCOUNTER — Inpatient Hospital Stay (HOSPITAL_COMMUNITY): Payer: Medicaid Other

## 2020-04-14 LAB — COMPREHENSIVE METABOLIC PANEL
ALT: 25 U/L (ref 0–44)
AST: 29 U/L (ref 15–41)
Albumin: 2.3 g/dL — ABNORMAL LOW (ref 3.5–5.0)
Alkaline Phosphatase: 83 U/L (ref 38–126)
Anion gap: 9 (ref 5–15)
BUN: 10 mg/dL (ref 6–20)
CO2: 26 mmol/L (ref 22–32)
Calcium: 8.3 mg/dL — ABNORMAL LOW (ref 8.9–10.3)
Chloride: 104 mmol/L (ref 98–111)
Creatinine, Ser: 0.7 mg/dL (ref 0.44–1.00)
GFR calc Af Amer: >60 mL/min (ref >60)
GFR calc non Af Amer: >60 mL/min (ref >60)
Glucose, Bld: 126 mg/dL — ABNORMAL HIGH (ref 70–99)
Potassium: 4.2 mmol/L (ref 3.5–5.1)
Sodium: 139 mmol/L (ref 135–145)
Total Bilirubin: 0.8 mg/dL (ref 0.3–1.2)
Total Protein: 5.5 g/dL — ABNORMAL LOW (ref 6.5–8.1)

## 2020-04-14 LAB — GLUCOSE, CAPILLARY
Glucose-Capillary: 89 mg/dL (ref 70–99)
Glucose-Capillary: 126 mg/dL — ABNORMAL HIGH (ref 70–99)
Glucose-Capillary: 105 mg/dL — ABNORMAL HIGH (ref 70–99)
Glucose-Capillary: 153 mg/dL — ABNORMAL HIGH (ref 70–99)

## 2020-04-14 MED ORDER — IOHEXOL 300 MG/ML  SOLN: 100.0000 mL | Freq: Once | INTRAMUSCULAR | Status: DC | PRN

## 2020-04-14 MED ORDER — PROSOURCE PLUS PO LIQD
30.0000 mL | Freq: Two times a day (BID) | ORAL | Status: DC
  Administered 2020-04-14 – 2020-04-15 (×2): 30 mL via ORAL
  Filled 2020-04-14 (×4): qty 30

## 2020-04-14 MED ORDER — SODIUM CHLORIDE (PF) 0.9 % IJ SOLN
INTRAMUSCULAR | Status: AC
  Filled 2020-04-14: qty 50

## 2020-04-14 MED ORDER — IOHEXOL 9 MG/ML PO SOLN
ORAL | Status: AC
  Filled 2020-04-14: qty 1000

## 2020-04-14 MED ORDER — TAB-A-VITE/IRON PO TABS
1.0000 | ORAL_TABLET | Freq: Every day | ORAL | Status: DC
  Administered 2020-04-14 – 2020-04-19 (×6): 1 via ORAL
  Filled 2020-04-14 (×6): qty 1

## 2020-04-14 MED ORDER — ENSURE MAX PROTEIN PO LIQD
11.0000 [oz_av] | Freq: Every day | ORAL | Status: DC
  Administered 2020-04-14 – 2020-04-18 (×5): 11 [oz_av] via ORAL
  Filled 2020-04-14 (×6): qty 330

## 2020-04-14 MED ORDER — IOHEXOL 9 MG/ML PO SOLN
500.0000 mL | ORAL | Status: AC
  Administered 2020-04-14: 500 mL via ORAL

## 2020-04-14 NOTE — Progress Notes (Signed)
PROGRESS NOTE    Amber May  AYT:[id number] DOB: 12-Apr-1981 DOA: 04/02/2020 PCP: Patient, No Pcp Per   Brief Narrative:  39 year old woman PMH hypothyroidism, morbid obesity presented with abdominal pain and fever.  Admitted for severe acute pancreatitis, SIRS.  Treated with empiric antibiotics, aggressive IV fluids, seen by gastroenterology and general surgery.  Further work-up concerning for acute cholecystitis.  Condition slowly improving.  Cholecystectomy unable to be performed - IR performed cholecystostomy tube placement on 04/08/2020 with 4 L ascites drawn at time of procedure.  Patient continues to improve clinically, increasing ambulation over the past 24 hours, tolerating p.o. now without difficulty, multiple bowel movements without incident.  Leukocytosis downtrending, repeat CT per surgery to rule out any concurrent postoperative infection.  Tentative disposition pending ongoing surgical evaluation, with possible placement required at SNF versus home with home health and 24-hour supervision pending further PT recommendations and discussion with family.  Assessment & Plan:   Principal Problem:   Acute pancreatitis Active Problems:   SIRS (systemic inflammatory response syndrome) (HCC)   AKI (acute kidney injury) (HCC)   Hypothyroidism   Hyperglycemia   Elevated liver enzymes   Acute respiratory failure with hypoxemia (HCC)   Acute cholecystitis   Hypocalcemia   Hypophosphatemia   Morbid obesity with BMI of 50.0-59.9, adult (HCC)   Cholelithiasis   Epigastric abdominal pain   Acute pancreatitis with severe clinical features including fever, acute hypoxic respiratory failure, tachypnea, lactic acidosis, leukocytosis, acute kidney injury, ascites, elevated LFTs.   - Patient does not meet sepsis criteria, given no clear etiology for infection at this point. - Presumed gallstone pancreatitis. Triglycerides without significant elevation.  No alcohol use.  Elevated LFTs on  admission consistent with gallstone obstruction. - Leukocytosis r continues to downtrend slowly currently 21, admitted with leukocytosis in the 40s, no previous lab work to compare, unclear if patient has chronically elevated leukocytosis or if this was truly an acute process secondary to severe pancreatitis - Continue empiric cefepime - cultures remain negative, repeat CT abdomen pelvis per surgery to further rule out any possible source of infection previously not evident - Tolerated paracentesis and cholecystostomy drain placement on 04/08/2020. 4.8 bilious ascites was drawn off and 48F drain was placed - Appreciate insight and recommendations from surgery and radiology -advance diet per surgery - Per IR - plan for repeat cholangiogram/possible exchange in 6-8 weeks if cholecystectomy does not occur in interim  Acute hypoxic respiratory failure, multifactorial, resolving -Initially thought to be secondary to acute pancreatitis/cholecystitis, splinting from abdominal pain, and polypharmacy -Despite resolution of acute pancreatitis as above patient continues to have nighttime oxygen desaturation events -Patient likely has undiagnosed sleep apnea complicated by morbid obesity and probable chronic obesity hypoventilation syndrome. -Daily patient able to be weaned to room air, ambulates without hypoxia however overnight notable episodes of hypoxia requiring 2 L nasal cannula to maintain sats greater than 90% -Patient will likely need further evaluation outpatient follow-up with pulmonology for formal sleep study and likely fitting for CPAP/BiPAP   Acute cholecystitis, questionable.   - HIDA scan was abnormal as below. -Continue cefepime, status post percutaneous drainage with IR as above with adequate output and resolution of symptoms  Hypocalcemia, hypophosphatemia - Likely in the setting of poor p.o. intake -likely to improve with advancing diet as above  Acute kidney injury secondary to poor  p.o. intake and acute pancreatitis, resolved - Continue to increase p.o. intake as tolerated post procedure - IVF now stopped Lab Results  Component Value Date  CREATININE 0.59 04/13/2020   CREATININE 0.59 04/12/2020   CREATININE 0.63 04/11/2020   Hyperglycemia - Likely secondary to acute pancreatitis.  Hemoglobin A1c 5.4. - Overall mild in nature.  Continue sliding scale insulin.  Ambulatory dysfunction, improving  - Likely setting of obesity, post procedure and prolonged nonambulatory status while in bed perioperatively   - PT continues to recommend 24-hour supervision at home with home health versus SNF.    - Continue to discuss with case management and family, patient appears to have 24-hour care at home, given improving ambulation notable today with patient walking outside in the hallway she may be safe for discharge home in the next 24 to 48 hours versus SNF if she does not continue to progress or requires more assistance than she did today given her family cannot support her weight if she requires more than minimal assistance.  Hepatomegaly and hepatic steatosis.   - Likely related to morbid obesity/diet -CMP continues to be within normal limits other than mildly depressed albumin and protein levels. - Follow-up as an outpatient with gastroenterology.  Abnormal low-density in the lower uterine segment/cervix spanning 18 mm, nonspecific.  - Recommend nonemergent pelvic ultrasound for characterization on an elective outpatient basis.  Morbid obesity with concurrent moderate protein caloric malnutrition.  -- Body mass index is 62.1 kg/m. --Dietitian consult --Patient's albumin and protein remain low, encourage high-protein low-fat diet at this point with calorie restriction given morbid obesity, would benefit from further dietary nutrition consult in the outpatient setting after discharge, close follow-up with PCP for weight loss goals and activity regimen as ambulatory  dysfunction resolves as above.   DVT prophylaxis: Lovenox Code Status: Full Family Communication: None present Status is: Inpatient Dispo: The patient is from: Home              Anticipated d/c is to: To be determined, home with 24-hour care versus SNF              Anticipated d/c date is: 24-48h              Patient currently NOT medically stable for discharge due to ongoing need for close monitoring post procedure, IV antibiotics, and awaiting cultures and requires ongoing physical therapy in the setting of ambulatory dysfunction  Consultants:   Gen surgery, IR  Procedures:   - Paracentesis yielding 4.8L bilious ascites.   - Placement of transhepatic 62F perc cholecystostomy tube.    Antimicrobials:  Cefepime   Subjective: No acute issues or events overnight, family at bedside, patient called ambulating in the hallway with physical therapy today able to make at least 25 to 30 feet from her room before needing assistance and wheelchair.  She denies any shortness of breath, chest pain, nausea, vomiting, diarrhea, constipation with ambulation or overnight.  Objective: Vitals:   04/13/20 2016 04/14/20 0000 04/14/20 0500 04/14/20 0616  BP: 120/65   (!) 97/55  Pulse: 90   78  Resp: 19 19  18   Temp: 97.8 F (36.6 C)   97.6 F (36.4 C)  TempSrc: Oral   Oral  SpO2: 91%   (!) 88%  Weight:   (!) 150 kg   Height:        Intake/Output Summary (Last 24 hours) at 04/14/2020 0810 Last data filed at 04/14/2020 0700 Gross per 24 hour  Intake 445 ml  Output 260 ml  Net 185 ml   Filed Weights   04/10/20 0444 04/12/20 0500 04/14/20 0500  Weight: (!) 148.8 kg (!) 150  kg (!) 150 kg    Examination:  General:  Pleasantly resting in bed, No acute distress. HEENT:  Normocephalic atraumatic.  Sclerae nonicteric, noninjected.  Extraocular movements intact bilaterally. Neck:  Without mass or deformity.  Trachea is midline. Lungs:  Clear to auscultate bilaterally without rhonchi, wheeze, or  rales. Heart:  Regular rate and rhythm.  Without murmurs, rubs, or gallops. Abdomen:  Soft, obese, right upper quadrant drain in place draining scant amount of amber fluid, bandage clean dry intact. Extremities: Without cyanosis, clubbing, edema, or obvious deformity. Vascular:  Dorsalis pedis and posterior tibial pulses palpable bilaterally. Skin:  Warm and dry, no erythema, no ulcerations.   Data Reviewed: I have personally reviewed following labs and imaging studies  CBC: Recent Labs  Lab 04/09/20 0149 04/10/20 0240 04/11/20 0240 04/12/20 0528 04/13/20 0458  WBC 25.8* 31.2* 30.3* 23.1* 21.9*  HGB 12.4 11.8* 12.6 12.3 11.6*  HCT 40.7 38.0 41.9 40.6 38.4  MCV 84.8 84.8 85.7 84.8 84.4  PLT 408* 364 391 368 327   Basic Metabolic Panel: Recent Labs  Lab 04/08/20 0132 04/08/20 0132 04/09/20 0149 04/10/20 0240 04/11/20 0240 04/12/20 0528 04/13/20 0458  NA 135   < > 136 137 134* 138 136  K 4.7   < > 4.4 4.6 4.2 4.4 4.1  CL 104   < > 107 104 101 102 102  CO2 21*   < > 22 23 24 23 25   GLUCOSE 125*   < > 135* 128* 107* 105* 105*  BUN 23*   < > 22* 15 13 11 10   CREATININE 0.69   < > 0.64 0.58 0.63 0.59 0.59  CALCIUM 8.1*   < > 8.1* 8.3* 8.3* 8.2* 8.2*  MG 2.7*  --   --   --   --   --   --   PHOS 3.0  --   --   --   --   --   --    < > = values in this interval not displayed.   GFR: Estimated Creatinine Clearance: 131.6 mL/min (by C-G formula based on SCr of 0.59 mg/dL). Liver Function Tests: Recent Labs  Lab 04/09/20 0149 04/10/20 0240 04/11/20 0240 04/12/20 0528 04/13/20 0458  AST 44* 38 39 26 26  ALT 38 38 35 27 24  ALKPHOS 98 99 109 88 81  BILITOT 1.0 0.8 1.1 0.8 0.6  PROT 5.5* 5.5* 5.7* 5.2* 5.0*  ALBUMIN 2.2* 2.1* 2.1* 2.1* 2.1*   No results for input(s): LIPASE, AMYLASE in the last 168 hours. No results for input(s): AMMONIA in the last 168 hours. Coagulation Profile: Recent Labs  Lab 04/08/20 0132  INR 1.2   Cardiac Enzymes: No results for  input(s): CKTOTAL, CKMB, CKMBINDEX, TROPONINI in the last 168 hours. BNP (last 3 results) No results for input(s): PROBNP in the last 8760 hours. HbA1C: No results for input(s): HGBA1C in the last 72 hours. CBG: Recent Labs  Lab 04/13/20 0732 04/13/20 1147 04/13/20 1654 04/13/20 2016 04/14/20 0739  GLUCAP 98 108* 97 134* 89   Lipid Profile: No results for input(s): CHOL, HDL, LDLCALC, TRIG, CHOLHDL, LDLDIRECT in the last 72 hours. Thyroid Function Tests: No results for input(s): TSH, T4TOTAL, FREET4, T3FREE, THYROIDAB in the last 72 hours. Anemia Panel: No results for input(s): VITAMINB12, FOLATE, FERRITIN, TIBC, IRON, RETICCTPCT in the last 72 hours. Sepsis Labs: No results for input(s): PROCALCITON, LATICACIDVEN in the last 168 hours.  Recent Results (from the past 240 hour(s))  MRSA PCR  Screening     Status: None   Collection Time: 04/04/20  9:08 AM   Specimen: Nasal Mucosa; Nasopharyngeal  Result Value Ref Range Status   MRSA by PCR NEGATIVE NEGATIVE Final    Comment:        The GeneXpert MRSA Assay (FDA approved for NASAL specimens only), is one component of a comprehensive MRSA colonization surveillance program. It is not intended to diagnose MRSA infection nor to guide or monitor treatment for MRSA infections. Performed at [facility], [address] [place], [place] [address]   Body fluid culture     Status: None   Collection Time: 04/08/20  3:18 PM   Specimen: Peritoneal Cavity; Body Fluid  Result Value Ref Range Status   Specimen Description   Final    PERITONEAL CAVITY Performed at [facility], [address], [place], [place] [address]    Special Requests   Final    Normal Performed at [facility], [address], [place], [place] [address]    Gram Stain NO WBC SEEN NO ORGANISMS SEEN   Final   Culture   Final    NO GROWTH 3 DAYS Performed at [facility], [address]  [place], [place] [address]    Report Status 04/11/2020 FINAL  Final      Radiology Studies: No results found. Scheduled Meds: . Chlorhexidine Gluconate Cloth  6 each Topical Daily  . enoxaparin (LOVENOX) injection  75 mg Subcutaneous Q24H  . insulin aspart  0-6 Units Subcutaneous TID WC  . levothyroxine  75 mcg Oral Q0600  . mouth rinse  15 mL Mouth Rinse BID  . morphine      . polyethylene glycol  17 g Oral Daily  . sodium chloride flush  10-40 mL Intracatheter Q12H  . sodium chloride flush  5 mL Intracatheter Q8H   Continuous Infusions: . sodium chloride Stopped (04/05/20 1702)  . ceFEPime (MAXIPIME) IV 2 g (04/14/20 0544)     LOS: 11 days   Time spent:  Azucena Fallen, DO [facility]  If 7PM-7AM, please contact night-coverage [email or web address]  04/14/2020, 8:10 AM

## 2020-04-14 NOTE — Progress Notes (Addendum)
Physical Therapy Treatment Patient Details Name: Amber May MRN: [id number] DOB: 10/27/1980 Today's Date: 04/14/2020    History of Present Illness 39 year old woman PMH hypothyroidism, morbid obesity presented with abdominal pain and fever and admitted for acute pancreatitis with severe clinical features including fever, acute hypoxic respiratory failure, tachypnea, lactic acidosis, leukocytosis, acute kidney injury, ascites, elevated LFTs.Acute calculus cholecystitis s/p percutaneous cholecystostomy tube placement in IR 04/08/2020    PT Comments    Progressing with mobility.    Follow Up Recommendations  Home health PT;Supervision/Assistance - 24 hour;SNF (depending on family decision)     Equipment Recommendations  Rolling walker with 5" wheels    Recommendations for Other Services       Precautions / Restrictions Precautions Precautions: Fall Precaution Comments: R side drain Restrictions Weight Bearing Restrictions: No    Mobility  Bed Mobility Overal bed mobility: Needs Assistance Bed Mobility: Supine to Sit          General bed mobility comments: oob in recliner  Transfers Overall transfer level: Needs assistance Equipment used: 4-wheeled walker Transfers: Sit to/from Stand Sit to Stand: Supervision        General transfer comment: Increased time. Cues for hand placement  Ambulation/Gait Ambulation/Gait assistance: May guard Gait Distance (Feet): 30 Feet (15'x2, 30'x2) Assistive device: 4-wheeled walker(bari) Gait Pattern/deviations: Step-through pattern;Decreased stride length     General Gait Details: fatigues quickly. O2 95% on RA. Dyspnea 2/4   Stairs             Wheelchair Mobility    Modified Rankin (Stroke Patients Only)       Balance Overall balance assessment: Needs assistance Sitting-balance support: No upper extremity supported;Feet supported Sitting balance-Leahy Scale: Good     Standing balance support: Bilateral  upper extremity supported Standing balance-Leahy Scale: Poor                              Cognition Arousal/Alertness: Awake/alert Behavior During Therapy: Flat affect Overall Cognitive Status: Within Functional Limits for tasks assessed                                 General Comments: Responses delayed, but contect appropraite. Occasional stutter.      Exercises      General Comments        Pertinent Vitals/Pain Pain Assessment: Faces Pain Score: 8  Faces Pain Scale: Hurts even more Pain Location: low back Pain Descriptors / Indicators: Discomfort;Sore Pain Intervention(s): Monitored during session;Repositioned    Home Living                      Prior Function            PT Goals (current goals can now be found in the care plan section) Acute Rehab PT Goals Patient Stated Goal: "Get back to 100%" Progress towards PT goals: Progressing toward goals    Frequency    May 3X/week      PT Plan Current plan remains appropriate    Co-evaluation       OT goals addressed during session: ADL's and self-care      AM-PAC PT "6 Clicks" Mobility   Outcome Measure  Help needed turning from your back to your side while in a flat bed without using bedrails?: A Little Help needed moving from lying on your back to sitting on the side  of a flat bed without using bedrails?: A Little Help needed moving to and from a bed to a chair (including a wheelchair)?: A Little Help needed standing up from a chair using your arms (e.g., wheelchair or bedside chair)?: A Little Help needed to walk in hospital room?: A Little Help needed climbing 3-5 steps with a railing? : A Little 6 Click Score: 18    End of Session   Activity Tolerance: Patient limited by fatigue Patient left: in chair;with call bell/phone within reach   PT Visit Diagnosis: Difficulty in walking, not elsewhere classified (R26.2)     Time: 3545-6256 PT Time Calculation  (May) (ACUTE ONLY): 27 May  Charges:  $Gait Training: 23-37 mins                         Faye Ramsay, PT Acute Rehabilitation  Office: [phone] Pager: [phone]

## 2020-04-14 NOTE — Progress Notes (Signed)
Nutrition Follow-up  DOCUMENTATION CODES:   Morbid obesity  INTERVENTION:  - will order 30 ml Prosource Plus BID, each supplement provides 100 kcal and 15 grams protein. - will order Ensure Max once/day, each supplement provides 150 kcal and 30 grams protein. - will order 1 tablet multivitamin with minerals/day.   NUTRITION DIAGNOSIS:   Increased nutrient needs related to acute illness as evidenced by estimated needs. -ongoing  GOAL:   Patient will meet greater than or equal to 90% of their needs -improving  MONITOR:   PO intake, Supplement acceptance, Labs, Weight trends  ASSESSMENT:   39 year old female with medical history of hypothyroidism and morbid obesity. She presented with abdominal pain and fever. She was admitted for severe acute pancreatitis and SIRS. Further work-up concerning for acute cholecystitis. Cholecystectomy unable to be performed - IR evaluating for cholecystostomy tube placement.  Weight has fluctuated throughout hospitalization. She ate 100% of lunch on 7/4 and 50% of breakfast and lunch yesterday. Diet advanced from CLD to FLD on 7/2 and to Heart Healthy later that same day. Will order supplements as outlined above.   Per notes: - IR drain placed on 6/30 d/t acute pancreatitis, cholelithiasis, and cholecystitis - s/p paracentesis with 4.8L drained  - plan for CT with contrast today    Labs reviewed; CBG: 89 mg/dl, Ca: 8.2 mg/dl. Medications reviewed; sliding scale novolog, 75 mcg oral synthroid/day, 17 g miralax/day.   Diet Order:   Diet Order            Diet Heart Room service appropriate? Yes; Fluid consistency: Thin  Diet effective 1400                 EDUCATION NEEDS:   Not appropriate for education at this time  Skin:  Skin Assessment: Reviewed RN Assessment  Last BM:  7/5  Height:   Ht Readings from Last 1 Encounters:  04/03/20 5\' 2"  (1.575 m)    Weight:   Wt Readings from Last 1 Encounters:  04/14/20 (!) 150 kg      Estimated Nutritional Needs:  Kcal:  2300-2500 kcal Protein:  120-135 grams Fluid:  >/= 2.3 L/day     06/15/20, MS, RD, LDN, CNSC Inpatient Clinical Dietitian RD pager # available in AMION  After hours/weekend pager # available in [facility]

## 2020-04-14 NOTE — Progress Notes (Signed)
7 Days Post-Op    CC: Abdominal pain  Subjective: She is up and moving.  She was initially going to the bathroom and actually caught her in the hallway after that.  She is feeling better, normal soreness.  The biliary drainage is a pea soup color.  Is not normal-appearing bile.  Objective: Vital signs in last 24 hours: Temp:  [97.6 F (36.4 C)-97.8 F (36.6 C)] 97.6 F (36.4 C) (07/06 0616) Pulse Rate:  [78-90] 78 (07/06 0616) Resp:  [18-26] 18 (07/06 0616) BP: (97-120)/(50-65) 97/55 (07/06 0616) SpO2:  [88 %-93 %] 88 % (07/06 0616) Weight:  [150 kg] 150 kg (07/06 0500) Last BM Date: 04/13/20 240 p.o. recorded 200 IV recorded Urine 100 recorded Drain 160 BM x1 recorded O2 sat down to 88% this a.m. 91 to 93% for most of yesterday on room air. No labs this a.m.  WBC yesterday was 21.9  Intake/Output from previous day: 07/05 0701 - 07/06 0700 In: 445 [P.O.:240; IV Piggyback:200] Out: 260 [Urine:100; Drains:160] Intake/Output this shift: No intake/output data recorded.  General appearance: alert, cooperative and no distress Resp: Ambulating without oxygen. GI: Soft, sore, drains in place.  She does not seem abnormally tender considering she has a drain present.  The drainage is a pea soup color.  Lab Results:  Recent Labs    04/12/20 0528 04/13/20 0458  WBC 23.1* 21.9*  HGB 12.3 11.6*  HCT 40.6 38.4  PLT 368 327    BMET Recent Labs    04/12/20 0528 04/13/20 0458  NA 138 136  K 4.4 4.1  CL 102 102  CO2 23 25  GLUCOSE 105* 105*  BUN 11 10  CREATININE 0.59 0.59  CALCIUM 8.2* 8.2*   PT/INR No results for input(s): LABPROT, INR in the last 72 hours.  Recent Labs  Lab 04/09/20 0149 04/10/20 0240 04/11/20 0240 04/12/20 0528 04/13/20 0458  AST 44* 38 39 26 26  ALT 38 38 35 27 24  ALKPHOS 98 99 109 88 81  BILITOT 1.0 0.8 1.1 0.8 0.6  PROT 5.5* 5.5* 5.7* 5.2* 5.0*  ALBUMIN 2.2* 2.1* 2.1* 2.1* 2.1*     Lipase     Component Value Date/Time   LIPASE  33 04/05/2020 0658     Medications: . Chlorhexidine Gluconate Cloth  6 each Topical Daily  . enoxaparin (LOVENOX) injection  75 mg Subcutaneous Q24H  . insulin aspart  0-6 Units Subcutaneous TID WC  . levothyroxine  75 mcg Oral Q0600  . mouth rinse  15 mL Mouth Rinse BID  . morphine      . polyethylene glycol  17 g Oral Daily  . sodium chloride flush  10-40 mL Intracatheter Q12H  . sodium chloride flush  5 mL Intracatheter Q8H    Assessment/Plan Acute hypoxic respiratory failure-multifactorial AKI  -Creatinine 1.98>> 1.01>> 0.64>> 0.59 E coli UTI Hyperglycemia Hepatomegaly/hepatic steatosis Morbid obesity BMI 61.9   Acute pancreatitis, cholelithiasis,cholecystitis  -Tbili normalized and LFTs within normal limits -MRCP 6/25 negative for choledocholithiasis - WBC slowly improving, 23K this AM - HIDAwith non-vis of gallbladder -laparoscopic cholecystectomy canceled by anesthesia for pulmonary issues 6/29 - IR drain 6/30; IRparacentesis yielding 4.8 L of bilious appearing ascites- culture, cell count, bilirubin and cytology pending.  ZSW:[id number] liquids/IV fluids ID: Vancomycin 6/24-6/26; cefepime 6/26 >> day   - WBC 45.6>>34.6>>29.8>>28.5>>25.8>>30.3>>21.9 DVT: Lovenox    Plan: We will obtain a CT with contrast today.  Recheck all labs in the a.m.  LOS: 11 days  Aleighna Wojtas 04/14/2020 Please see Amion

## 2020-04-14 NOTE — Progress Notes (Signed)
Occupational Therapy Treatment Patient Details Name: Amber May MRN: [id number] DOB: 20-Jan-1981 Today's Date: 04/14/2020    History of present illness 39 year old woman PMH hypothyroidism, morbid obesity presented with abdominal pain and fever and admitted for acute pancreatitis with severe clinical features including fever, acute hypoxic respiratory failure, tachypnea, lactic acidosis, leukocytosis, acute kidney injury, ascites, elevated LFTs.Acute calculus cholecystitis s/p percutaneous cholecystostomy tube placement in IR 04/08/2020   OT comments  Patient progressing and showed improved bed mobility and ability to transfer from sit<>stand compared to previous session. Patient limited by LT lower back and hip pain along with deficits noted below. Pt continues to demonstrate good rehab potential and would benefit from continued skilled OT to increase safety and independence with ADLs and functional transfers to allow pt to return home safely and reduce caregiver burden and fall risk.   Follow Up Recommendations  SNF (Pt reports goal to return home with home health PT and OT.)    Equipment Recommendations   (Will continue to assess)    Recommendations for Other Services      Precautions / Restrictions Precautions Precautions: Fall Precaution Comments: R side drain Restrictions Weight Bearing Restrictions: No       Mobility Bed Mobility Overal bed mobility: Needs Assistance Bed Mobility: Supine to Sit     Supine to sit: Min guard;HOB elevated     General bed mobility comments: Increased time.  Transfers Overall transfer level: Needs assistance Equipment used: Rolling walker (2 wheeled) Transfers: Sit to/from [employer] Sit to Stand: Min guard;From elevated surface Stand pivot transfers: Supervision       General transfer comment: Increased time. Cues for hand placement    Balance Overall balance assessment: Needs assistance Sitting-balance  support: No upper extremity supported;Feet supported Sitting balance-Leahy Scale: Good     Standing balance support: Bilateral upper extremity supported Standing balance-Leahy Scale: Fair                  ADL either performed or assessed with clinical judgement   ADL Overall ADL's : Needs assistance/impaired Eating/Feeding: Independent Eating/Feeding Details (indicate cue type and reason): Independent after setup of breakfast tray. Grooming: Set up;Wash/dry face;Wash/dry hands Grooming Details (indicate cue type and reason): Pt declined standing at sink to groom. Performed face and hand hygiene in recliner with setup.                 Toilet Transfer: Doctor, general practice Details (indicate cue type and reason): Simulated to recliner.         Functional mobility during ADLs: Min guard;Minimal assistance;Set up;Cueing for safety;Rolling walker General ADL Comments: Verbal cues for hand placement with sit<>stands     Vision Baseline Vision/History: No visual deficits Patient Visual Report: No change from baseline Vision Assessment?: No apparent visual deficits   Perception     Praxis      Cognition Arousal/Alertness: Awake/alert Behavior During Therapy: Flat affect Overall Cognitive Status: Within Functional Limits for tasks assessed                                 General Comments: Responses delayed, but contect appropraite. Occasional stutter.        Exercises     Shoulder Instructions       General Comments      Pertinent Vitals/ Pain       Pain Assessment: 0-10 Pain Score: 8  Pain Location: LT lower back/hip.  Pain Intervention(s): Repositioned;Limited activity within patient's tolerance;Premedicated before session                                                          Frequency  Min 2X/week        Progress Toward Goals  OT Goals(current goals can now be found in the care  plan section)  Progress towards OT goals: Progressing toward goals  Acute Rehab OT Goals Patient Stated Goal: "Get back to 100%" OT Goal Formulation: With patient Time For Goal Achievement: 04/24/20 Potential to Achieve Goals: Good ADL Goals Pt Will Perform Grooming: with modified independence;standing (3/3) Pt Will Perform Lower Body Dressing: with modified independence;with set-up;with adaptive equipment Pt Will Transfer to Toilet: with modified independence;grab bars Pt Will Perform Toileting - Clothing Manipulation and hygiene: with modified independence;sit to/from stand;sitting/lateral leans Pt/caregiver will Perform Home Exercise Program: Both right and left upper extremity;With theraband;Increased strength  Plan Discharge plan needs to be updated    Co-evaluation          OT goals addressed during session: ADL's and self-care      AM-PAC OT "6 Clicks" Daily Activity     Outcome Measure   Help from another person eating meals?: None Help from another person taking care of personal grooming?: A Little Help from another person toileting, which includes using toliet, bedpan, or urinal?: A Lot Help from another person bathing (including washing, rinsing, drying)?: A Lot Help from another person to put on and taking off regular upper body clothing?: A Little Help from another person to put on and taking off regular lower body clothing?: A Lot 6 Click Score: 16    End of Session Equipment Utilized During Treatment: Gait belt;Rolling walker  OT Visit Diagnosis: Muscle weakness (generalized) (M62.81);Pain Pain - Right/Left: Left Pain - part of body: Hip   Activity Tolerance Patient limited by fatigue;Patient tolerated treatment well;Patient limited by pain   Patient Left in chair;with call bell/phone within reach;with chair alarm set   Nurse Communication Mobility status        Time: 4098-1191 OT Time Calculation (min): 29 min  Charges: OT General Charges $OT  Visit: 1 Visit OT Treatments $Self Care/Home Management : 8-22 mins $Therapeutic Activity: 8-22 mins  Victorino Dike, OT Acute Rehab Services Office: [phone] 04/14/2020    Theodoro Clock 04/14/2020, 10:44 AM

## 2020-04-15 LAB — GLUCOSE, CAPILLARY
Glucose-Capillary: 90 mg/dL (ref 70–99)
Glucose-Capillary: 86 mg/dL (ref 70–99)
Glucose-Capillary: 102 mg/dL — ABNORMAL HIGH (ref 70–99)
Glucose-Capillary: 111 mg/dL — ABNORMAL HIGH (ref 70–99)

## 2020-04-15 LAB — CBC
HCT: 40.2 % (ref 36.0–46.0)
Hemoglobin: 12.6 g/dL (ref 12.0–15.0)
MCH: 25.8 pg — ABNORMAL LOW (ref 26.0–34.0)
MCHC: 31.3 g/dL (ref 30.0–36.0)
MCV: 82.2 fL (ref 80.0–100.0)
Platelets: 219 10*3/uL (ref 150–400)
RBC: 4.89 MIL/uL (ref 3.87–5.11)
RDW: 15.9 % — ABNORMAL HIGH (ref 11.5–15.5)
WBC: 18.2 10*3/uL — ABNORMAL HIGH (ref 4.0–10.5)
nRBC: 0.3 % — ABNORMAL HIGH (ref 0.0–0.2)

## 2020-04-15 MED ORDER — PROSOURCE PLUS PO LIQD
30.0000 mL | Freq: Two times a day (BID) | ORAL | Status: DC
  Administered 2020-04-15 – 2020-04-16 (×3): 30 mL via ORAL
  Filled 2020-04-15 (×4): qty 30

## 2020-04-15 MED ORDER — ENOXAPARIN SODIUM 80 MG/0.8ML ~~LOC~~ SOLN
75.0000 mg | SUBCUTANEOUS | Status: DC
  Administered 2020-04-17 – 2020-04-19 (×3): 75 mg via SUBCUTANEOUS
  Filled 2020-04-15 (×3): qty 0.8

## 2020-04-15 NOTE — Progress Notes (Signed)
PROGRESS NOTE  Amber May  ZOX:[id number] DOB: 1980/11/12 DOA: 04/02/2020 PCP: Patient, No Pcp Per   Brief Narrative: Amber May is a 39 y.o. female with a history of morbid obesity (BMI 60), and hypothyroidism who presented 6/24 with abdominal pain and fever found to have SIRS due to severe acute pancreatitis treated with empiric antibiotics, aggressive IV fluids, seen by gastroenterology and general surgery.Further work-up concerning for acute cholecystitis for which percutaneous cholecystostomy drain was placed 6/30 at the same time as 4L paracentesis. With continued supportive care, leukocytosis has improved but not resolved. CT abdomen/pelvis ordered 7/6 showed large left-sided abdominal fluid collection for which IR was reconsulted, planning drain exchange 7/8  Assessment & Plan: Principal Problem:   Acute pancreatitis Active Problems:   SIRS (systemic inflammatory response syndrome) (HCC)   AKI (acute kidney injury) (HCC)   Hypothyroidism   Hyperglycemia   Elevated liver enzymes   Acute respiratory failure with hypoxemia (HCC)   Acute cholecystitis   Hypocalcemia   Hypophosphatemia   Morbid obesity with BMI of 50.0-59.9, adult (HCC)   Cholelithiasis   Epigastric abdominal pain   Acute pancreatitis with severe clinical features including fever, acute hypoxic respiratory failure, tachypnea, lactic acidosis, leukocytosis, acute kidney injury, ascites, elevated LFTs.  Improving symptomatically, imaging confirms improved inflammatory changes.  - Patient does not meet sepsis criteria, given no clear etiology for infection at this point. -Presumed gallstone pancreatitis. Triglycerides without significant elevation. No alcohol use. Elevated LFTs on admission consistent withgallstone obstruction. - Continue empiric cefepime- cultures remain negative, repeat CT abdomen pelvis per surgery to further rule out any possible source of infection previously not evident - Tolerated  paracentesis and cholecystostomy drain placement on 04/08/2020. 4.8 bilious ascites was drawn off and 6F drain was placed - Appreciate insight and recommendations from surgery and radiology-advance diet per surgery  Intraabdominal fluid collection:  - IR to exchange bile drain which still has turbid fluid. Will continue abx and await their recommendations, possible fluid collection drainage. Monitor leukocytosis   Acute hypoxic respiratory failure, multifactorial, resolving -Initially thought to be secondary to acute pancreatitis/cholecystitis, splinting from abdominal pain, and polypharmacy -Despite resolution of acute pancreatitis as above patient continues to have nighttime oxygen desaturation events -Patient likely has undiagnosed sleep apnea complicated by morbid obesity and probable chronic obesity hypoventilation syndrome. -Daily patient able to be weaned to room air, ambulates without hypoxia however overnight notable episodes of hypoxia requiring 2 L nasal cannula to maintain sats greater than 90% -Patient will likely need further evaluation outpatient follow-up with pulmonology for formal sleep study and likely fitting for CPAP/BiPAP   Acute cholecystitis, questionable. - HIDA scan was abnormal as below. -Continue cefepime, status post percutaneous drainage with IR as above with adequate output and resolution of symptoms  Hypocalcemia, hypophosphatemia - Likely in the setting of poor p.o. intake -likely to improve with advancing diet as above  Acute kidney injury secondary to poor p.o. intake and acute pancreatitis, resolved  Hyperglycemia - Likely secondary to acute pancreatitis. Hemoglobin A1c 5.4. - Overall mild in nature. Continue sliding scale insulin.  Ambulatory dysfunction, improving: May be able to DC home with 24 hr assistance.   Hepatomegaly and hepatic steatosis: LFTs stable.  - Likely related to morbid obesity/diet - Follow-up as an outpatient with  gastroenterology.  Abnormal low-density in the lower uterine segment/cervix spanning 18 mm, nonspecific. - Recommend nonemergent pelvic ultrasound for characterization on an elective outpatient basis.  Morbid obesity with concurrent moderate protein caloric malnutrition. -- Body  mass index is 62.1 kg/m. --Dietitianconsult --Patient's albumin and protein remain low, encourage high-protein low-fat diet at this point with calorie restriction given morbid obesity, would benefit from further dietary nutrition consult in the outpatient setting after discharge, close follow-up with PCP for weight loss goals and activity regimen as ambulatory dysfunction resolves as above.   DVT prophylaxis: Lovenox Code Status: Full Family Communication: None at bedside Disposition Plan:  Status is: Inpatient  Remains inpatient appropriate because:Ongoing diagnostic testing needed not appropriate for outpatient work up   Dispo:  Patient From: Home  Planned Disposition: To be determined  Expected discharge date: 04/17/2020  Medically stable for discharge: No  Consultants:   IR  Surgery  Procedures:   - Paracentesis yielding 4.8L bilious ascites.   - Placement of transhepatic 84F perc cholecystostomy tube.  Antimicrobials:  Cefepime   Subjective: Feels better day by day, abdominal pain much improved, tolerating some diet. No fevers. No new complaints. Able to ambulate more yesterday than previously and eager to continue progressing.   Objective: Vitals:   04/14/20 1031 04/14/20 1235 04/14/20 1935 04/15/20 1414  BP: 114/60 103/76 128/61 99/86  Pulse: 82 94 93 89  Resp:  18 (!) 24   Temp:  (!) 97.5 F (36.4 C) 98.7 F (37.1 C) 98.5 F (36.9 C)  TempSrc:  Oral Oral Oral  SpO2: 96% 93% 93% 94%  Weight:      Height:        Intake/Output Summary (Last 24 hours) at 04/15/2020 1847 Last data filed at 04/15/2020 1800 Gross per 24 hour  Intake 955 ml  Output 75 ml  Net 880 ml    Filed Weights   04/10/20 0444 04/12/20 0500 04/14/20 0500  Weight: (!) 148.8 kg (!) 150 kg (!) 150 kg    Gen: 39 y.o. female in no distress  Pulm: Non-labored breathing. Clear to auscultation bilaterally.  CV: Regular rate and rhythm. No murmur, rub, or gallop. No JVD, no pedal edema. GI: Abdomen soft, obese, minimally tender, diffusely, non-distended, with normoactive bowel sounds. No organomegaly or masses felt. Ext: Warm, no deformities Skin: No rashes, lesions or ulcers. Bile drain site c/d/i Neuro: Alert and oriented. No focal neurological deficits. Psych: Judgement and insight appear normal. Mood & affect appropriate.   Data Reviewed: I have personally reviewed following labs and imaging studies  CBC: Recent Labs  Lab 04/10/20 0240 04/11/20 0240 04/12/20 0528 04/13/20 0458 04/15/20 0434  WBC 31.2* 30.3* 23.1* 21.9* 18.2*  HGB 11.8* 12.6 12.3 11.6* 12.6  HCT 38.0 41.9 40.6 38.4 40.2  MCV 84.8 85.7 84.8 84.4 82.2  PLT 364 391 368 327 219   Basic Metabolic Panel: Recent Labs  Lab 04/10/20 0240 04/11/20 0240 04/12/20 0528 04/13/20 0458 04/14/20 1158  NA 137 134* 138 136 139  K 4.6 4.2 4.4 4.1 4.2  CL 104 101 102 102 104  CO2 23 24 23 25 26   GLUCOSE 128* 107* 105* 105* 126*  BUN 15 13 11 10 10   CREATININE 0.58 0.63 0.59 0.59 0.70  CALCIUM 8.3* 8.3* 8.2* 8.2* 8.3*   GFR: Estimated Creatinine Clearance: 131.6 mL/min (by C-G formula based on SCr of 0.7 mg/dL). Liver Function Tests: Recent Labs  Lab 04/10/20 0240 04/11/20 0240 04/12/20 0528 04/13/20 0458 04/14/20 1158  AST 38 39 26 26 29   ALT 38 35 27 24 25   ALKPHOS 99 109 88 81 83  BILITOT 0.8 1.1 0.8 0.6 0.8  PROT 5.5* 5.7* 5.2* 5.0* 5.5*  ALBUMIN 2.1* 2.1*  2.1* 2.1* 2.3*   No results for input(s): LIPASE, AMYLASE in the last 168 hours. No results for input(s): AMMONIA in the last 168 hours. Coagulation Profile: No results for input(s): INR, PROTIME in the last 168 hours. Cardiac Enzymes: No  results for input(s): CKTOTAL, CKMB, CKMBINDEX, TROPONINI in the last 168 hours. BNP (last 3 results) No results for input(s): PROBNP in the last 8760 hours. HbA1C: No results for input(s): HGBA1C in the last 72 hours. CBG: Recent Labs  Lab 04/14/20 1648 04/14/20 1932 04/15/20 0754 04/15/20 1136 04/15/20 1652  GLUCAP 105* 153* 90 86 102*   Lipid Profile: No results for input(s): CHOL, HDL, LDLCALC, TRIG, CHOLHDL, LDLDIRECT in the last 72 hours. Thyroid Function Tests: No results for input(s): TSH, T4TOTAL, FREET4, T3FREE, THYROIDAB in the last 72 hours. Anemia Panel: No results for input(s): VITAMINB12, FOLATE, FERRITIN, TIBC, IRON, RETICCTPCT in the last 72 hours. Urine analysis: No results found for: COLORURINE, APPEARANCEUR, LABSPEC, PHURINE, GLUCOSEU, HGBUR, BILIRUBINUR, KETONESUR, PROTEINUR, UROBILINOGEN, NITRITE, LEUKOCYTESUR Recent Results (from the past 240 hour(s))  Body fluid culture     Status: None   Collection Time: 04/08/20  3:18 PM   Specimen: Peritoneal Cavity; Body Fluid  Result Value Ref Range Status   Specimen Description   Final    PERITONEAL CAVITY Performed at [facility], [address], [place], [place] [address]    Special Requests   Final    Normal Performed at [facility], [address] [place], [place] [address]    Gram Stain NO WBC SEEN NO ORGANISMS SEEN   Final   Culture   Final    NO GROWTH 3 DAYS Performed at [facility], [address], [place], [place] [address]    Report Status 04/11/2020 FINAL  Final      Radiology Studies: CT ABDOMEN PELVIS WO CONTRAST  Result Date: 04/14/2020 CLINICAL DATA:  39 year old female with cholecystitis status post percutaneous cholecystostomy. Continued leukocytosis. EXAM: CT ABDOMEN AND PELVIS WITHOUT CONTRAST TECHNIQUE: Multidetector CT imaging of the abdomen and pelvis was performed following the standard protocol without IV contrast. COMPARISON:  CT  abdomen pelvis dated 04/02/2020. FINDINGS: Evaluation of this exam is limited in the absence of intravenous contrast. Lower chest: There are trace bilateral pleural effusions with bibasilar patchy densities which may represent atelectasis or infiltrate. Clinical correlation is recommended. There is mild eventration of the left hemidiaphragm. Coronary vascular calcifications noted. No intra-abdominal free air. There is a large fluid collection lateral to the spleen extending inferiorly to the anterior pelvis. This fluid measures approximately 11 x 13 cm in axial dimensions and 34 cm in craniocaudal length. There is associated mass effect on the peritoneal structures. In addition there is a smaller fluid in the right abdomen extending from the subhepatic area inferiorly. Hepatobiliary: The liver is unremarkable. No intrahepatic biliary ductal dilatation. The gallbladder is contracted. A percutaneous cholecystostomy is noted with pigtail tip in the region of the gallbladder fundus. No calcified gallstone. Pancreas: Unremarkable. No pancreatic ductal dilatation or surrounding inflammatory changes. Spleen: The spleen is mildly displaced medially secondary to mass effect caused by adjacent fluid. The spleen is otherwise unremarkable. Adrenals/Urinary Tract: The adrenal glands unremarkable. There is no hydronephrosis or nephrolithiasis on either side. The visualized ureters and urinary bladder appear unremarkable. Stomach/Bowel: There is no bowel obstruction or active inflammation. There is displacement of the bowel by the abdominal fluid collection. Vascular/Lymphatic: The abdominal aorta and IVC are grossly unremarkable. No portal venous gas.  There is no adenopathy. Reproductive: The uterus is anteverted and grossly unremarkable. No adnexal masses. Other: There is subcutaneous edema. Musculoskeletal: Mild degenerative changes of the spine. No acute osseous pathology. IMPRESSION: 1. Large fluid collection lateral to the  spleen extending inferiorly to the anterior pelvis with associated mass effect on the peritoneal structures. 2. Percutaneous cholecystostomy with pigtail tip in the region of the gallbladder fundus. 3. No bowel obstruction. 4. Trace bilateral pleural effusions with bibasilar patchy densities which may represent atelectasis or infiltrate. Clinical correlation is recommended. 5. Aortic Atherosclerosis (ICD10-I70.0). Electronically Signed   By: Elgie Collard M.D.   On: 04/14/2020 19:56    Scheduled Meds: . (feeding supplement) PROSource Plus  30 mL Oral BID BM  . Chlorhexidine Gluconate Cloth  6 each Topical Daily  . [START ON 04/17/2020] enoxaparin (LOVENOX) injection  75 mg Subcutaneous Q24H  . insulin aspart  0-6 Units Subcutaneous TID WC  . levothyroxine  75 mcg Oral Q0600  . mouth rinse  15 mL Mouth Rinse BID  . morphine      . multivitamins with iron  1 tablet Oral Daily  . polyethylene glycol  17 g Oral Daily  . Ensure Max Protein  11 oz Oral Daily  . sodium chloride flush  10-40 mL Intracatheter Q12H  . sodium chloride flush  5 mL Intracatheter Q8H   Continuous Infusions: . sodium chloride Stopped (04/05/20 1702)  . ceFEPime (MAXIPIME) IV 2 g (04/15/20 1311)     LOS: 12 days   Time spent: 25 minutes.  Tyrone Nine, MD [facility] [email or web address] 04/15/2020, 6:47 PM

## 2020-04-15 NOTE — Progress Notes (Signed)
[facility] Progress Note  8 Days Post-Op  Subjective: Patient reports she is tolerating diet and having bowel function. Some abdominal pain. Breathing feels much improved from last week.   Objective: Vital signs in last 24 hours: Temp:  [97.5 F (36.4 C)-98.7 F (37.1 C)] 98.7 F (37.1 C) (07/06 1935) Pulse Rate:  [82-94] 93 (07/06 1935) Resp:  [18-24] 24 (07/06 1935) BP: (103-128)/(60-76) 128/61 (07/06 1935) SpO2:  [93 %-96 %] 93 % (07/06 1935) Last BM Date: 04/13/20  Intake/Output from previous day: 07/06 0701 - 07/07 0700 In: 1475 [P.O.:1070; IV Piggyback:400] Out: 425 [Drains:425] Intake/Output this shift: No intake/output data recorded.  PE: General: pleasant, WD, morbidly obese white female who is sitting in chair, NAD HEENT: Sclera are anicteric.  PERRL.  Ears and nose without any masses or lesions.  Mouth is pink and moist Heart: regular, rate, and rhythm.  Normal s1,s2. No obvious murmurs, gallops, or rubs noted.  Palpable radial and pedal pulses bilaterally Lungs: on room air, no tachypnea, no wheezing  Abd: soft, mildly ttp in epigastrium and LUQ, ND, drain in RUQ with thick bilious drainage    Lab Results:  Recent Labs    04/13/20 0458 04/15/20 0434  WBC 21.9* 18.2*  HGB 11.6* 12.6  HCT 38.4 40.2  PLT 327 219   BMET Recent Labs    04/13/20 0458 04/14/20 1158  NA 136 139  K 4.1 4.2  CL 102 104  CO2 25 26  GLUCOSE 105* 126*  BUN 10 10  CREATININE 0.59 0.70  CALCIUM 8.2* 8.3*   PT/INR No results for input(s): LABPROT, INR in the last 72 hours. CMP     Component Value Date/Time   NA 139 04/14/2020 1158   K 4.2 04/14/2020 1158   CL 104 04/14/2020 1158   CO2 26 04/14/2020 1158   GLUCOSE 126 (H) 04/14/2020 1158   BUN 10 04/14/2020 1158   CREATININE 0.70 04/14/2020 1158   CALCIUM 8.3 (L) 04/14/2020 1158   PROT 5.5 (L) 04/14/2020 1158   ALBUMIN 2.3 (L) 04/14/2020 1158   AST 29 04/14/2020 1158   ALT 25 04/14/2020 1158    ALKPHOS 83 04/14/2020 1158   BILITOT 0.8 04/14/2020 1158   GFRNONAA >60 04/14/2020 1158   GFRAA >60 04/14/2020 1158   Lipase     Component Value Date/Time   LIPASE 33 04/05/2020 0658       Studies/Results: CT ABDOMEN PELVIS WO CONTRAST  Result Date: 04/14/2020 CLINICAL DATA:  39 year old female with cholecystitis status post percutaneous cholecystostomy. Continued leukocytosis. EXAM: CT ABDOMEN AND PELVIS WITHOUT CONTRAST TECHNIQUE: Multidetector CT imaging of the abdomen and pelvis was performed following the standard protocol without IV contrast. COMPARISON:  CT abdomen pelvis dated 04/02/2020. FINDINGS: Evaluation of this exam is limited in the absence of intravenous contrast. Lower chest: There are trace bilateral pleural effusions with bibasilar patchy densities which may represent atelectasis or infiltrate. Clinical correlation is recommended. There is mild eventration of the left hemidiaphragm. Coronary vascular calcifications noted. No intra-abdominal free air. There is a large fluid collection lateral to the spleen extending inferiorly to the anterior pelvis. This fluid measures approximately 11 x 13 cm in axial dimensions and 34 cm in craniocaudal length. There is associated mass effect on the peritoneal structures. In addition there is a smaller fluid in the right abdomen extending from the subhepatic area inferiorly. Hepatobiliary: The liver is unremarkable. No intrahepatic biliary ductal dilatation. The gallbladder is contracted. A percutaneous cholecystostomy is noted with pigtail  tip in the region of the gallbladder fundus. No calcified gallstone. Pancreas: Unremarkable. No pancreatic ductal dilatation or surrounding inflammatory changes. Spleen: The spleen is mildly displaced medially secondary to mass effect caused by adjacent fluid. The spleen is otherwise unremarkable. Adrenals/Urinary Tract: The adrenal glands unremarkable. There is no hydronephrosis or nephrolithiasis on either  side. The visualized ureters and urinary bladder appear unremarkable. Stomach/Bowel: There is no bowel obstruction or active inflammation. There is displacement of the bowel by the abdominal fluid collection. Vascular/Lymphatic: The abdominal aorta and IVC are grossly unremarkable. No portal venous gas. There is no adenopathy. Reproductive: The uterus is anteverted and grossly unremarkable. No adnexal masses. Other: There is subcutaneous edema. Musculoskeletal: Mild degenerative changes of the spine. No acute osseous pathology. IMPRESSION: 1. Large fluid collection lateral to the spleen extending inferiorly to the anterior pelvis with associated mass effect on the peritoneal structures. 2. Percutaneous cholecystostomy with pigtail tip in the region of the gallbladder fundus. 3. No bowel obstruction. 4. Trace bilateral pleural effusions with bibasilar patchy densities which may represent atelectasis or infiltrate. Clinical correlation is recommended. 5. Aortic Atherosclerosis (ICD10-I70.0). Electronically Signed   By: Elgie Collard M.D.   On: 04/14/2020 19:56    Anti-infectives: Anti-infectives (From admission, onward)   Start     Dose/Rate Route Frequency Ordered Stop   04/04/20 1300  vancomycin (VANCOREADY) IVPB 1500 mg/300 mL  Status:  Discontinued        1,500 mg 150 mL/hr over 120 Minutes Intravenous Every 12 hours 04/04/20 1014 04/06/20 1157   04/03/20 2000  vancomycin (VANCOREADY) IVPB 1750 mg/350 mL  Status:  Discontinued        1,750 mg 175 mL/hr over 120 Minutes Intravenous Every 24 hours 04/02/20 1937 04/03/20 1157   04/03/20 1400  ceFEPIme (MAXIPIME) 2 g in sodium chloride 0.9 % 100 mL IVPB     Discontinue     2 g 200 mL/hr over 30 Minutes Intravenous Every 8 hours 04/03/20 1157     04/03/20 1300  vancomycin (VANCOREADY) IVPB 1250 mg/250 mL  Status:  Discontinued        1,250 mg 166.7 mL/hr over 90 Minutes Intravenous Every 12 hours 04/03/20 1157 04/04/20 1014   04/03/20 0600   ceFEPIme (MAXIPIME) 2 g in sodium chloride 0.9 % 100 mL IVPB  Status:  Discontinued        2 g 200 mL/hr over 30 Minutes Intravenous Every 12 hours 04/02/20 1937 04/03/20 1157   04/02/20 1930  vancomycin (VANCOCIN) IVPB 1000 mg/200 mL premix        1,000 mg 200 mL/hr over 60 Minutes Intravenous Every hour 04/02/20 1900 04/02/20 2129   04/02/20 1845  ceFEPIme (MAXIPIME) 2 g in sodium chloride 0.9 % 100 mL IVPB        2 g 200 mL/hr over 30 Minutes Intravenous  Once 04/02/20 1840 04/02/20 1944   04/02/20 1845  metroNIDAZOLE (FLAGYL) IVPB 500 mg        500 mg 100 mL/hr over 60 Minutes Intravenous  Once 04/02/20 1840 04/02/20 2228   04/02/20 1845  vancomycin (VANCOCIN) IVPB 1000 mg/200 mL premix  Status:  Discontinued        1,000 mg 200 mL/hr over 60 Minutes Intravenous  Once 04/02/20 1840 04/02/20 1900       Assessment/Plan Acute hypoxic respiratory failure- multifactorial, improving  AKI - Creatinine 1.98>> 1.01>> 0.64>> 0.59 E coli UTI Hyperglycemia Hepatomegaly/hepatic steatosis Morbid obesity BMI 61.9   Acute pancreatitis, cholelithiasis,cholecystitis  -Tbili normalized  and LFTs within normal limits -MRCP 6/25 negative for choledocholithiasis - WBC slowly improving, 18 this AM - HIDAwith non-vis of gallbladder -laparoscopic cholecystectomy canceled by anesthesia for pulmonary issues 6/29 - IR drain 6/30; IRparacentesis yielding 4.8 L of bilious appearing ascites- culture without any growth, cytology and Tbili consistent with bilious ascites  - CT 7/6 with large perisplenic collection and small amount fluid in subhepatic space, cholecystostomy seems to be in place, pancreas seems improved - will need to discuss further with attending and IR - ?possibly needs para vs another drain vs cholangiogram to ensure position of cholecystostomy   FEN:HH diet  ID: Vancomycin 6/24-6/26; cefepime 6/26 >>  DVT: Lovenox   LOS: 12 days    Juliet Rude , [facility] 04/15/2020, 10:06 AM Please see Amion for pager number during day hours 7:00am-4:30pm

## 2020-04-15 NOTE — Progress Notes (Addendum)
Referring Physician(s): Nat Christen  Supervising Physician: Malachy Moan  Patient Status:  [facility] - In-pt  Chief Complaint: Abdominal pain/cholecystitis   Subjective: Patient currently eating lunch; states she has some mild epigastric discomfort; denies fever, respiratory issues, nausea, vomiting or bleeding   Allergies: Patient has no known allergies.  Medications: Prior to Admission medications   Medication Sig Start Date End Date Taking? Authorizing Provider  levothyroxine (SYNTHROID) 125 MCG tablet Take 125 mcg by mouth daily before breakfast.   Yes [provider]     Vital Signs: BP 99/86 (BP Location: Left Arm)   Pulse 89   Temp 98.5 F (36.9 C) (Oral)   Resp (!) 24   Ht 5\' 2"  (1.575 m)   Wt (!) 330 lb 12.8 oz (150 kg)   SpO2 94%   BMI 60.50 kg/m   Physical Exam awake, alert.  Chest with slightly diminished breath sounds bases.  Heart with regular rate and rhythm.  Abdomen soft, gallbladder drain intact, insertion site okay, mildly tender to palpation, output 425 cc of turbid green fluid  Imaging: CT ABDOMEN PELVIS WO CONTRAST  Result Date: 04/14/2020 CLINICAL DATA:  39 year old female with cholecystitis status post percutaneous cholecystostomy. Continued leukocytosis. EXAM: CT ABDOMEN AND PELVIS WITHOUT CONTRAST TECHNIQUE: Multidetector CT imaging of the abdomen and pelvis was performed following the standard protocol without IV contrast. COMPARISON:  CT abdomen pelvis dated 04/02/2020. FINDINGS: Evaluation of this exam is limited in the absence of intravenous contrast. Lower chest: There are trace bilateral pleural effusions with bibasilar patchy densities which may represent atelectasis or infiltrate. Clinical correlation is recommended. There is mild eventration of the left hemidiaphragm. Coronary vascular calcifications noted. No intra-abdominal free air. There is a large fluid collection lateral to the spleen extending inferiorly to the anterior  pelvis. This fluid measures approximately 11 x 13 cm in axial dimensions and 34 cm in craniocaudal length. There is associated mass effect on the peritoneal structures. In addition there is a smaller fluid in the right abdomen extending from the subhepatic area inferiorly. Hepatobiliary: The liver is unremarkable. No intrahepatic biliary ductal dilatation. The gallbladder is contracted. A percutaneous cholecystostomy is noted with pigtail tip in the region of the gallbladder fundus. No calcified gallstone. Pancreas: Unremarkable. No pancreatic ductal dilatation or surrounding inflammatory changes. Spleen: The spleen is mildly displaced medially secondary to mass effect caused by adjacent fluid. The spleen is otherwise unremarkable. Adrenals/Urinary Tract: The adrenal glands unremarkable. There is no hydronephrosis or nephrolithiasis on either side. The visualized ureters and urinary bladder appear unremarkable. Stomach/Bowel: There is no bowel obstruction or active inflammation. There is displacement of the bowel by the abdominal fluid collection. Vascular/Lymphatic: The abdominal aorta and IVC are grossly unremarkable. No portal venous gas. There is no adenopathy. Reproductive: The uterus is anteverted and grossly unremarkable. No adnexal masses. Other: There is subcutaneous edema. Musculoskeletal: Mild degenerative changes of the spine. No acute osseous pathology. IMPRESSION: 1. Large fluid collection lateral to the spleen extending inferiorly to the anterior pelvis with associated mass effect on the peritoneal structures. 2. Percutaneous cholecystostomy with pigtail tip in the region of the gallbladder fundus. 3. No bowel obstruction. 4. Trace bilateral pleural effusions with bibasilar patchy densities which may represent atelectasis or infiltrate. Clinical correlation is recommended. 5. Aortic Atherosclerosis (ICD10-I70.0). Electronically Signed   By: 04/04/2020 M.D.   On: 04/14/2020 19:56     Labs:  CBC: Recent Labs    04/11/20 0240 04/12/20 0528 04/13/20 0458 04/15/20 0434  WBC  30.3* 23.1* 21.9* 18.2*  HGB 12.6 12.3 11.6* 12.6  HCT 41.9 40.6 38.4 40.2  PLT 391 368 327 219    COAGS: Recent Labs    04/02/20 1847 04/08/20 0132  INR  --  1.2  APTT 26  --     BMP: Recent Labs    04/11/20 0240 04/12/20 0528 04/13/20 0458 04/14/20 1158  NA 134* 138 136 139  K 4.2 4.4 4.1 4.2  CL 101 102 102 104  CO2 24 23 25 26   GLUCOSE 107* 105* 105* 126*  BUN 13 11 10 10   CALCIUM 8.3* 8.2* 8.2* 8.3*  CREATININE 0.63 0.59 0.59 0.70  GFRNONAA >60 >60 >60 >60  GFRAA >60 >60 >60 >60    LIVER FUNCTION TESTS: Recent Labs    04/11/20 0240 04/12/20 0528 04/13/20 0458 04/14/20 1158  BILITOT 1.1 0.8 0.6 0.8  AST 39 26 26 29   ALT 35 27 24 25   ALKPHOS 109 88 81 83  PROT 5.7* 5.2* 5.0* 5.5*  ALBUMIN 2.1* 2.1* 2.1* 2.3*    Assessment and Plan: Patient with history of acute gallstone pancreatitis as well as acute calculus cholecystitis, poor surgical candidate;status post percutaneous cholecystostomy as well as paracentesis on 6/30 yielding bilious ascites- cx/cytology neg; afebrile, WBC 18.2 down from 21.9, hemoglobin stable; follow-up CT abdomen pelvis yesterday revealed large fluid collection lateral to the spleen, bladder drained with tip in region of gallbladder fundus, no bowel obstruction, trace bilateral pleural effusions; a portion of gallbladder drain with sideholes appears outside gallbladder, therefore we will plan gallbladder drain exchange as well as image guided aspiration and possible drainage of perisplenic fluid collection on 7/8.  Details/risks of procedures, including but not limited to, internal bleeding, infection, injury to adjacent structures discussed with patient and mother with their understanding and consent.  Case has been discussed with CCS.   Electronically Signed: D. , PA-C 04/15/2020, 3:06 PM   I spent a total of 20 minutes  at the the patient's bedside AND on the patient's hospital floor or unit, greater than 50% of which was counseling/coordinating care for gallbladder drain in place image guided aspiration/drainage of    Patient ID: [id number], female   DOB: 06/30/1981, 39 y.o.   MRN: 06/16/2020

## 2020-04-15 NOTE — Progress Notes (Signed)
Physical Therapy Treatment Patient Details Name: Amber May MRN: [id number] DOB: December 18, 1980 Today's Date: 04/15/2020    History of Present Illness 39 year old woman PMH hypothyroidism, morbid obesity presented with abdominal pain and fever and admitted for acute pancreatitis with severe clinical features including fever, acute hypoxic respiratory failure, tachypnea, lactic acidosis, leukocytosis, acute kidney injury, ascites, elevated LFTs.Acute calculus cholecystitis s/p percutaneous cholecystostomy tube placement in IR 04/08/2020    PT Comments    Progressing slowly with mobility. Pt walked a total of ~110 feet on today however she completes this in short bursts with frequent rest breaks. Will continue to follow and progress activity as tolerated.    Follow Up Recommendations  Home health PT;Supervision/Assistance - 24 hour vs SNF (depending on family decision)     Equipment Recommendations  None recommended by PT    Recommendations for Other Services       Precautions / Restrictions Precautions Precautions: Fall Precaution Comments: R side drain Restrictions Weight Bearing Restrictions: No    Mobility  Bed Mobility Overal bed mobility: Needs Assistance Bed Mobility: Supine to Sit;Sit to Supine     Supine to sit: Supervision;HOB elevated Sit to supine: Supervision;HOB elevated      Transfers Overall transfer level: Needs assistance Equipment used: 4-wheeled walker Transfers: Sit to/from Stand Sit to Stand: Min guard         General transfer comment: Increased time. Cues for hand placement  Ambulation/Gait Ambulation/Gait assistance: Min guard Gait Distance (Feet): total of ~110 feet (45'x1, 25'x1, 20'x2) Assistive device: 4-wheeled walker Gait Pattern/deviations: Step-through pattern;Decreased stride length     General Gait Details: fatigues quickly-frequent rest breaks. O2 95% on RA. Dyspnea 2/4   Stairs             Wheelchair Mobility     Modified Rankin (Stroke Patients Only)       Balance Overall balance assessment: Needs assistance         Standing balance support: Bilateral upper extremity supported Standing balance-Leahy Scale: Poor                              Cognition Arousal/Alertness: Awake/alert Behavior During Therapy: Flat affect Overall Cognitive Status: Within Functional Limits for tasks assessed                                        Exercises      General Comments        Pertinent Vitals/Pain Pain Assessment: Faces Faces Pain Scale: Hurts little more Pain Location: low back Pain Descriptors / Indicators: Discomfort;Sore Pain Intervention(s): Monitored during session;Repositioned    Home Living                      Prior Function            PT Goals (current goals can now be found in the care plan section) Progress towards PT goals: Progressing toward goals    Frequency    Min 3X/week      PT Plan Current plan remains appropriate    Co-evaluation              AM-PAC PT "6 Clicks" Mobility   Outcome Measure  Help needed turning from your back to your side while in a flat bed without using bedrails?: A Little Help needed moving from lying on  your back to sitting on the side of a flat bed without using bedrails?: A Little Help needed moving to and from a bed to a chair (including a wheelchair)?: A Little Help needed standing up from a chair using your arms (e.g., wheelchair or bedside chair)?: A Little Help needed to walk in hospital room?: A Little Help needed climbing 3-5 steps with a railing? : A Lot 6 Click Score: 17    End of Session   Activity Tolerance: Patient limited by fatigue Patient left: in bed;with call bell/phone within reach;with bed alarm set   PT Visit Diagnosis: Difficulty in walking, not elsewhere classified (R26.2)     Time: 1505-6979 PT Time Calculation (min) (ACUTE ONLY): 36 min  Charges:   $Gait Training: 23-37 mins                        Faye Ramsay, PT Acute Rehabilitation  Office: [phone] Pager: [phone]

## 2020-04-16 ENCOUNTER — Inpatient Hospital Stay (HOSPITAL_COMMUNITY): Payer: Medicaid Other

## 2020-04-16 HISTORY — PX: IR EXCHANGE BILIARY DRAIN: IMG6046

## 2020-04-16 LAB — CBC WITH DIFFERENTIAL/PLATELET
Abs Immature Granulocytes: 2.4 10*3/uL — ABNORMAL HIGH (ref 0.00–0.07)
Band Neutrophils: 6 %
Basophils Absolute: 0 10*3/uL (ref 0.0–0.1)
Basophils Relative: 0 %
Eosinophils Absolute: 0.3 10*3/uL (ref 0.0–0.5)
Eosinophils Relative: 2 %
HCT: 37 % (ref 36.0–46.0)
Hemoglobin: 11.5 g/dL — ABNORMAL LOW (ref 12.0–15.0)
Lymphocytes Relative: 13 %
Lymphs Abs: 2 10*3/uL (ref 0.7–4.0)
MCH: 25.8 pg — ABNORMAL LOW (ref 26.0–34.0)
MCHC: 31.1 g/dL (ref 30.0–36.0)
MCV: 83.1 fL (ref 80.0–100.0)
Metamyelocytes Relative: 3 %
Monocytes Absolute: 0.9 10*3/uL (ref 0.1–1.0)
Monocytes Relative: 6 %
Myelocytes: 12 %
Neutro Abs: 10 10*3/uL — ABNORMAL HIGH (ref 1.7–7.7)
Neutrophils Relative %: 58 %
Platelets: 255 10*3/uL (ref 150–400)
RBC: 4.45 MIL/uL (ref 3.87–5.11)
RDW: 15.7 % — ABNORMAL HIGH (ref 11.5–15.5)
WBC: 15.7 10*3/uL — ABNORMAL HIGH (ref 4.0–10.5)
nRBC: 0.2 % (ref 0.0–0.2)

## 2020-04-16 LAB — COMPREHENSIVE METABOLIC PANEL
ALT: 21 U/L (ref 0–44)
AST: 23 U/L (ref 15–41)
Albumin: 2.3 g/dL — ABNORMAL LOW (ref 3.5–5.0)
Alkaline Phosphatase: 67 U/L (ref 38–126)
Anion gap: 9 (ref 5–15)
BUN: 11 mg/dL (ref 6–20)
CO2: 27 mmol/L (ref 22–32)
Calcium: 8.4 mg/dL — ABNORMAL LOW (ref 8.9–10.3)
Chloride: 104 mmol/L (ref 98–111)
Creatinine, Ser: 0.64 mg/dL (ref 0.44–1.00)
GFR calc Af Amer: >60 mL/min (ref >60)
GFR calc non Af Amer: >60 mL/min (ref >60)
Glucose, Bld: 102 mg/dL — ABNORMAL HIGH (ref 70–99)
Potassium: 3.9 mmol/L (ref 3.5–5.1)
Sodium: 140 mmol/L (ref 135–145)
Total Bilirubin: 0.8 mg/dL (ref 0.3–1.2)
Total Protein: 5.4 g/dL — ABNORMAL LOW (ref 6.5–8.1)

## 2020-04-16 LAB — GLUCOSE, CAPILLARY
Glucose-Capillary: 101 mg/dL — ABNORMAL HIGH (ref 70–99)
Glucose-Capillary: 66 mg/dL — ABNORMAL LOW (ref 70–99)
Glucose-Capillary: 83 mg/dL (ref 70–99)

## 2020-04-16 MED ORDER — MIDAZOLAM HCL 2 MG/2ML IJ SOLN
INTRAMUSCULAR | Status: AC | PRN
  Administered 2020-04-16 (×2): 1 mg via INTRAVENOUS

## 2020-04-16 MED ORDER — PIPERACILLIN-TAZOBACTAM 3.375 G IVPB
INTRAVENOUS | Status: AC
  Administered 2020-04-16: 3.375 g via INTRAVENOUS
  Filled 2020-04-16: qty 50

## 2020-04-16 MED ORDER — LEVOTHYROXINE SODIUM 25 MCG PO TABS
125.0000 ug | ORAL_TABLET | Freq: Every day | ORAL | Status: DC
  Administered 2020-04-17 – 2020-04-19 (×3): 125 ug via ORAL
  Filled 2020-04-16 (×3): qty 1

## 2020-04-16 MED ORDER — FENTANYL CITRATE (PF) 100 MCG/2ML IJ SOLN
INTRAMUSCULAR | Status: AC
  Filled 2020-04-16: qty 2

## 2020-04-16 MED ORDER — IOHEXOL 300 MG/ML  SOLN
50.0000 mL | Freq: Once | INTRAMUSCULAR | Status: AC | PRN
  Administered 2020-04-16: 10 mL

## 2020-04-16 MED ORDER — LIDOCAINE HCL 1 % IJ SOLN
INTRAMUSCULAR | Status: AC
  Filled 2020-04-16: qty 20

## 2020-04-16 MED ORDER — LIDOCAINE HCL (PF) 1 % IJ SOLN
INTRAMUSCULAR | Status: AC | PRN
  Administered 2020-04-16: 5 mL
  Administered 2020-04-16: 10 mL

## 2020-04-16 MED ORDER — PIPERACILLIN-TAZOBACTAM 3.375 G IVPB 30 MIN: 3.3750 g | Freq: Once | INTRAVENOUS | Status: AC

## 2020-04-16 MED ORDER — FENTANYL CITRATE (PF) 100 MCG/2ML IJ SOLN
INTRAMUSCULAR | Status: AC | PRN
  Administered 2020-04-16 (×2): 50 ug via INTRAVENOUS

## 2020-04-16 MED ORDER — MIDAZOLAM HCL 2 MG/2ML IJ SOLN
INTRAMUSCULAR | Status: AC
  Filled 2020-04-16: qty 4

## 2020-04-16 NOTE — Progress Notes (Signed)
Occupational Therapy Treatment Patient Details Name: Amber May MRN: [id number] DOB: 09/24/1981 Today's Date: 04/16/2020    History of present illness 39 year old woman PMH hypothyroidism, morbid obesity presented with abdominal pain and fever and admitted for acute pancreatitis with severe clinical features including fever, acute hypoxic respiratory failure, tachypnea, lactic acidosis, leukocytosis, acute kidney injury, ascites, elevated LFTs.Acute calculus cholecystitis s/p percutaneous cholecystostomy tube placement in IR 04/08/2020. Exchange of percutaneous cholecystostomy tube and placement of left abdominal drain with aspiration of nearly 1L green bilious fluid with debris on 7/8   OT comments  Patient continues to make progress towards goals. Discussed POC with patient and family and report they want to go home at discharge. Patient able to donn underwear with set up, ambulate to bathroom, perform toileting and toilet transfer with min guard. Patient's o2 sats maintained WFL with ambulation and improved slightly with activity. Patient provided with encouragement and instruction on the use of the incentive spirometer. Discussed the use of a shower chair at home due to patient's decreased activity tolerance - especially with standing in place (patient required sitting break after pulling up underwear). Discussed using a chair at home for safety and rest breaks and also performing more activity to improve strength. Also the use of a long handled reacher for grabbing items off the floor. Patient demonstrated skills in order to return home at discharge.    Follow Up Recommendations  Home health OT    Equipment Recommendations  None recommended by OT    Recommendations for Other Services      Precautions / Restrictions Precautions Precautions: Fall Precaution Comments: R and L side drain Restrictions Weight Bearing Restrictions: No       Mobility Bed Mobility   Bed Mobility: Supine  to Sit;Sit to Supine     Supine to sit: Supervision;HOB elevated        Transfers   Equipment used: Rolling walker (2 wheeled) Transfers: Sit to/from Stand Sit to Stand: Min guard         General transfer comment: Min guard for ambulation and toilet transfer and return to recliner. o2 sats between 92-93% with ambulation. 90-91% at rest.    Balance     Sitting balance-Leahy Scale: Good     Standing balance support: During functional activity Standing balance-Leahy Scale: Good Standing balance comment: Patient able to stand without upper extremity support for toileting and lower body dressing.                           ADL either performed or assessed with clinical judgement   ADL                       Lower Body Dressing: Set up Lower Body Dressing Details (indicate cue type and reason): Patient demonstrated ability to donn underwear sitting at side of bed. Patient reports typically not wearing socks at home. Toilet Transfer: Hydrographic surveyor Details (indicate cue type and reason): Patient ambulated to the bathroom, approx 10 feet, and performed toilet transfer with min guard and RW. Toileting- Architect and Hygiene: Min guard;Sit to/from stand Toileting - Clothing Manipulation Details (indicate cue type and reason): Min guard for toileting. Patient able to manage clothing.   Tub/Shower Transfer Details (indicate cue type and reason): Did not perform shower transfer but discussed how to perform at home. Reports they have a walk in shower and shower chair. Recommended use of shower  chair due to patient's limited standing tolerance. Functional mobility during ADLs: Health and safety inspector     Praxis      Cognition Arousal/Alertness: Awake/alert Behavior During Therapy: Flat affect Overall Cognitive Status: Within Functional Limits for tasks assessed                                  General Comments: Occasional stutter.        Exercises     Shoulder Instructions       General Comments      Pertinent Vitals/ Pain       Pain Assessment: Faces Faces Pain Scale: Hurts a little bit Pain Location: drain site Pain Descriptors / Indicators: Discomfort;Sore Pain Intervention(s): Monitored during session  Home Living                                          Prior Functioning/Environment              Frequency  Min 2X/week        Progress Toward Goals  OT Goals(current goals can now be found in the care plan section)  Progress towards OT goals: Progressing toward goals  Acute Rehab OT Goals Patient Stated Goal: "Get back to 100%" OT Goal Formulation: With patient Time For Goal Achievement: 04/24/20 Potential to Achieve Goals: Good  Plan Discharge plan needs to be updated    Co-evaluation          OT goals addressed during session: ADL's and self-care      AM-PAC OT "6 Clicks" Daily Activity     Outcome Measure   Help from another person eating meals?: None Help from another person taking care of personal grooming?: A Little Help from another person toileting, which includes using toliet, bedpan, or urinal?: A Little Help from another person bathing (including washing, rinsing, drying)?: A Little Help from another person to put on and taking off regular upper body clothing?: A Little Help from another person to put on and taking off regular lower body clothing?: A Little 6 Click Score: 19    End of Session Equipment Utilized During Treatment: Gait belt;Rolling walker  OT Visit Diagnosis: Muscle weakness (generalized) (M62.81);Pain Pain - Right/Left:  (bilateral drain sites)   Activity Tolerance Patient tolerated treatment well   Patient Left in chair;with call bell/phone within reach;with family/visitor present   Nurse Communication Mobility status (o2 sats)         Time: 8315-1761 OT Time Calculation (min): 23 min  Charges: OT General Charges $OT Visit: 1 Visit OT Treatments $Self Care/Home Management : 23-37 mins  Donya Tomaro, OTR/L Acute Care Rehab Services  Office [phone] Pager: [phone]    Kelli Churn 04/16/2020, 5:07 PM

## 2020-04-16 NOTE — Progress Notes (Signed)
SATURATION QUALIFICATIONS: (This note is used to comply with regulatory documentation for home oxygen)  Patient Saturations on Room Air at Rest = 90%  Patient Saturations on Room Air while Ambulating = 92%  Patient Saturations on n/a Liters of oxygen while Ambulating = n/a  Please briefly explain why patient needs home oxygen: patient does not require oxygen, maintains oxygen level above 90% even with ambulation

## 2020-04-16 NOTE — Progress Notes (Signed)
[facility] Progress Note  9 Days Post-Op  Subjective: Patient just returned from IR procedures. Reportedly a lot of purulent/bilious looking material drained from LUQ. Cholecystostomy exchanged as well. Patient denies worsened abdominal pain. Tolerating diet and having bowel function   Objective: Vital signs in last 24 hours: Temp:  [97.7 F (36.5 C)-98.5 F (36.9 C)] 97.7 F (36.5 C) (07/08 0611) Pulse Rate:  [82-95] 87 (07/08 0910) Resp:  [14-18] 15 (07/08 0910) BP: (99-153)/(61-98) 153/93 (07/08 0910) SpO2:  [90 %-97 %] 97 % (07/08 0910) Last BM Date: 04/14/20  Intake/Output from previous day: 07/07 0701 - 07/08 0700 In: 305 [I.V.:5; IV Piggyback:300] Out: 220 [Drains:220] Intake/Output this shift: No intake/output data recorded.  PE: General: pleasant, WD,morbidly obesewhite female who issitting in chair, NAD HEENT: Sclera areanicteric. PERRL. Ears and nose without any masses or lesions. Mouth is pink and moist Heart: regular, rate, and rhythm. Normal s1,s2. No obvious murmurs, gallops, or rubs noted. Palpable radial and pedal pulses bilaterally Lungs:on room air, no tachypnea, no wheezing Abd: soft, mildly ttp in epigastrium and LUQ,ND, drain in RUQ with scant bilious drainage, drain in LUQ    Lab Results:  Recent Labs    04/15/20 0434 04/16/20 0332  WBC 18.2* 15.7*  HGB 12.6 11.5*  HCT 40.2 37.0  PLT 219 255   BMET Recent Labs    04/14/20 1158 04/16/20 0332  NA 139 140  K 4.2 3.9  CL 104 104  CO2 26 27  GLUCOSE 126* 102*  BUN 10 11  CREATININE 0.70 0.64  CALCIUM 8.3* 8.4*   PT/INR No results for input(s): LABPROT, INR in the last 72 hours. CMP     Component Value Date/Time   NA 140 04/16/2020 0332   K 3.9 04/16/2020 0332   CL 104 04/16/2020 0332   CO2 27 04/16/2020 0332   GLUCOSE 102 (H) 04/16/2020 0332   BUN 11 04/16/2020 0332   CREATININE 0.64 04/16/2020 0332   CALCIUM 8.4 (L) 04/16/2020 0332   PROT 5.4 (L)  04/16/2020 0332   ALBUMIN 2.3 (L) 04/16/2020 0332   AST 23 04/16/2020 0332   ALT 21 04/16/2020 0332   ALKPHOS 67 04/16/2020 0332   BILITOT 0.8 04/16/2020 0332   GFRNONAA >60 04/16/2020 0332   GFRAA >60 04/16/2020 0332   Lipase     Component Value Date/Time   LIPASE 33 04/05/2020 0658       Studies/Results: CT ABDOMEN PELVIS WO CONTRAST  Result Date: 04/14/2020 CLINICAL DATA:  39 year old female with cholecystitis status post percutaneous cholecystostomy. Continued leukocytosis. EXAM: CT ABDOMEN AND PELVIS WITHOUT CONTRAST TECHNIQUE: Multidetector CT imaging of the abdomen and pelvis was performed following the standard protocol without IV contrast. COMPARISON:  CT abdomen pelvis dated 04/02/2020. FINDINGS: Evaluation of this exam is limited in the absence of intravenous contrast. Lower chest: There are trace bilateral pleural effusions with bibasilar patchy densities which may represent atelectasis or infiltrate. Clinical correlation is recommended. There is mild eventration of the left hemidiaphragm. Coronary vascular calcifications noted. No intra-abdominal free air. There is a large fluid collection lateral to the spleen extending inferiorly to the anterior pelvis. This fluid measures approximately 11 x 13 cm in axial dimensions and 34 cm in craniocaudal length. There is associated mass effect on the peritoneal structures. In addition there is a smaller fluid in the right abdomen extending from the subhepatic area inferiorly. Hepatobiliary: The liver is unremarkable. No intrahepatic biliary ductal dilatation. The gallbladder is contracted. A percutaneous cholecystostomy is noted  with pigtail tip in the region of the gallbladder fundus. No calcified gallstone. Pancreas: Unremarkable. No pancreatic ductal dilatation or surrounding inflammatory changes. Spleen: The spleen is mildly displaced medially secondary to mass effect caused by adjacent fluid. The spleen is otherwise unremarkable.  Adrenals/Urinary Tract: The adrenal glands unremarkable. There is no hydronephrosis or nephrolithiasis on either side. The visualized ureters and urinary bladder appear unremarkable. Stomach/Bowel: There is no bowel obstruction or active inflammation. There is displacement of the bowel by the abdominal fluid collection. Vascular/Lymphatic: The abdominal aorta and IVC are grossly unremarkable. No portal venous gas. There is no adenopathy. Reproductive: The uterus is anteverted and grossly unremarkable. No adnexal masses. Other: There is subcutaneous edema. Musculoskeletal: Mild degenerative changes of the spine. No acute osseous pathology. IMPRESSION: 1. Large fluid collection lateral to the spleen extending inferiorly to the anterior pelvis with associated mass effect on the peritoneal structures. 2. Percutaneous cholecystostomy with pigtail tip in the region of the gallbladder fundus. 3. No bowel obstruction. 4. Trace bilateral pleural effusions with bibasilar patchy densities which may represent atelectasis or infiltrate. Clinical correlation is recommended. 5. Aortic Atherosclerosis (ICD10-I70.0). Electronically Signed   By: Elgie Collard M.D.   On: 04/14/2020 19:56    Anti-infectives: Anti-infectives (From admission, onward)   Start     Dose/Rate Route Frequency Ordered Stop   04/16/20 0810  piperacillin-tazobactam (ZOSYN) IVPB 3.375 g        3.375 g 100 mL/hr over 30 Minutes Intravenous  Once 04/16/20 0809 04/16/20 0900   04/04/20 1300  vancomycin (VANCOREADY) IVPB 1500 mg/300 mL  Status:  Discontinued        1,500 mg 150 mL/hr over 120 Minutes Intravenous Every 12 hours 04/04/20 1014 04/06/20 1157   04/03/20 2000  vancomycin (VANCOREADY) IVPB 1750 mg/350 mL  Status:  Discontinued        1,750 mg 175 mL/hr over 120 Minutes Intravenous Every 24 hours 04/02/20 1937 04/03/20 1157   04/03/20 1400  ceFEPIme (MAXIPIME) 2 g in sodium chloride 0.9 % 100 mL IVPB     Discontinue     2 g 200 mL/hr over  30 Minutes Intravenous Every 8 hours 04/03/20 1157     04/03/20 1300  vancomycin (VANCOREADY) IVPB 1250 mg/250 mL  Status:  Discontinued        1,250 mg 166.7 mL/hr over 90 Minutes Intravenous Every 12 hours 04/03/20 1157 04/04/20 1014   04/03/20 0600  ceFEPIme (MAXIPIME) 2 g in sodium chloride 0.9 % 100 mL IVPB  Status:  Discontinued        2 g 200 mL/hr over 30 Minutes Intravenous Every 12 hours 04/02/20 1937 04/03/20 1157   04/02/20 1930  vancomycin (VANCOCIN) IVPB 1000 mg/200 mL premix        1,000 mg 200 mL/hr over 60 Minutes Intravenous Every hour 04/02/20 1900 04/02/20 2129   04/02/20 1845  ceFEPIme (MAXIPIME) 2 g in sodium chloride 0.9 % 100 mL IVPB        2 g 200 mL/hr over 30 Minutes Intravenous  Once 04/02/20 1840 04/02/20 1944   04/02/20 1845  metroNIDAZOLE (FLAGYL) IVPB 500 mg        500 mg 100 mL/hr over 60 Minutes Intravenous  Once 04/02/20 1840 04/02/20 2228   04/02/20 1845  vancomycin (VANCOCIN) IVPB 1000 mg/200 mL premix  Status:  Discontinued        1,000 mg 200 mL/hr over 60 Minutes Intravenous  Once 04/02/20 1840 04/02/20 1900  Assessment/Plan Acute hypoxic respiratory failure- multifactorial, improving  E. Coli UTI Hyperglycemia Hepatomegaly/hepatic steatosis Morbid obesity BMI 61.9   Acute pancreatitis, cholelithiasis,cholecystitis  -Tbili normalized and LFTs within normal limits -MRCP 6/25 negative for choledocholithiasis - WBC slowly improving, 15 this AM - HIDAwith non-vis of gallbladder -laparoscopic cholecystectomy canceled by anesthesia for pulmonary issues 6/29 - IR drain 6/30; IRparacentesis yielding 4.8 L of bilious appearing ascites- culture without any growth, cytology and Tbili consistent with bilious ascites  - CT 7/6 with large perisplenic collection and small amount fluid in subhepatic space, cholecystostomy seems to be in place, pancreas seems improved - s/p IR drain exchange for cholecystostomy and IR drainage of LUQ  collection today - repeat labs in AM, diet as tolerated  FEN:HH diet  ID: Vancomycin 6/24-6/26; cefepime 6/26 >>  DVT: Lovenox  LOS: 13 days    Juliet Rude , [facility] 04/16/2020, 10:02 AM Please see Amion for pager number during day hours 7:00am-4:30pm

## 2020-04-16 NOTE — Progress Notes (Signed)
Educated mother on care of biliary drains including how to flush and how to empty. Mother felt she would be able to do this if ordered when patient goes home.

## 2020-04-16 NOTE — TOC Progression Note (Signed)
Transition of Care [facility]) - Progression Note    Patient Details  Name: Amber May MRN: [id number] Date of Birth: [birth year]  Transition of Care [facility]) CM/SW Contact  Geni Bers, RN Phone Number: 04/16/2020, 3:29 PM  Clinical Narrative:    Spoke with pt and pt's mother at bedside concerning HH vs SNF. Pt's mother states that pt will go home with 24/7 care, mother was trained on drain care. Mother continued to say that she is comfortable and know how to care for pt's drains.    Expected Discharge Plan: (P) Home w Home Health Services Barriers to Discharge: (P) No Barriers Identified  Expected Discharge Plan and Services Expected Discharge Plan: (P) Home w Home Health Services   Discharge Planning Services: CM Consult   Living arrangements for the past 2 months: (P) Single Family Home                                       Social Determinants of Health (SDOH) Interventions    Readmission Risk Interventions No flowsheet data found.

## 2020-04-16 NOTE — Progress Notes (Signed)
PROGRESS NOTE  Amber May  QJF:[id number] DOB: [birth year] DOA: 04/02/2020 PCP: Patient, No Pcp Per   Brief Narrative: Amber May is a 39 y.o. female with a history of morbid obesity (BMI 60), and hypothyroidism who presented 6/24 with abdominal pain and fever found to have SIRS due to severe acute pancreatitis treated with empiric antibiotics, aggressive IV fluids, seen by gastroenterology and general surgery.Further work-up concerning for acute cholecystitis for which percutaneous cholecystostomy drain was placed 6/30 at the same time as 4L paracentesis. With continued supportive care, leukocytosis has improved but not resolved. CT abdomen/pelvis ordered 7/6 showed large left-sided abdominal fluid collection for which IR was reconsulted. They performed exchange of percutaneous cholecystostomy tube and placement of left abdominal drain with aspiration of nearly 1L green bilious fluid with debris on 7/8.  Assessment & Plan: Principal Problem:   Acute pancreatitis Active Problems:   SIRS (systemic inflammatory response syndrome) (HCC)   AKI (acute kidney injury) (HCC)   Hypothyroidism   Hyperglycemia   Elevated liver enzymes   Acute respiratory failure with hypoxemia (HCC)   Acute cholecystitis   Hypocalcemia   Hypophosphatemia   Morbid obesity with BMI of 50.0-59.9, adult (HCC)   Cholelithiasis   Epigastric abdominal pain   Acute pancreatitis, calculous cholecystitis: LFTs normalized s/p percutaneous cholecystostomy drain 6/30. No choledocholithiasis on MRCP, HIDA with nonvisualization of GB. Cultures negative to date. Presumed to be gallstone pancreatitis without EtOH history or severe hypertriglyceridemia.   - Cefepime continued. Will follow clinical response and leukocytosis now that fluid has been drained. - Surgery following, lap cholecystectomy was cancelled by anesthesia due to pulmonary issues, though these have resolved. Anticipate eventual cholecystectomy. - s/p  paracentesis and cholecystostomy drain placement on 04/08/2020. 4.8 bilious ascites was drawn off and 55F drain was placed - Diet per IR/surgery.  Intraabdominal fluid collection:  - As above, continuing IV abx. - Drain placed 7/8, care per IR.  Acute hypoxic respiratory failure, multifactorial: Secondary to acute pancreatitis/cholecystitis, splinting from abdominal pain, and polypharmacy. With some nocturnal desaturations, suspect undiagnosed OSA, probable OHS with atelectasis from prolonged hospitalization.  - Continue to wean from oxygen, recommend sleep study as outpatient.  - Will touch base with surgery about cholecystectomy with more stable pulmonary status at this point.  Hypocalcemia, hypophosphatemia: Improved.  - Continue monitoring with advancing diet.   AKI: Resolved.   Hyperglycemia: due to stress/infection/acute pancreatitis. Hemoglobin A1c 5.4%.  - No insulin required in past 6 days. Will stop CBG checks.   Ambulatory dysfunction: Improving. Continue PT/OT efforts.   Hepatomegaly and hepatic steatosis: LFTs normalized.   Abnormal low-density in the lower uterine segment/cervix spanning 18 mm, nonspecific. - Recommend nonemergent pelvic ultrasound after discharge.  Morbid obesity with concurrent moderate protein caloric malnutrition. - Body mass index is 60.5 kg/m.  - Supplement protein, dietitian consulted.   Telemetry personally reviewed from hospitalization: Showing early sinus tachycardia with only NSR for several days. No significant arrhythmias. Will DC telemetry to facilitate mobility.   Hypothyroidism: Resume home dose synthroid.   DVT prophylaxis: Lovenox Code Status: Full Family Communication: None at bedside Disposition Plan:  Status is: Inpatient  Remains inpatient appropriate because:Ongoing diagnostic testing needed not appropriate for outpatient work up   Dispo:  Patient From: Home  Planned Disposition: Home  Expected discharge  date: 1-2 days.  Medically stable for discharge: No  Consultants:   IR  Surgery  Procedures:   - Paracentesis yielding 4.8L bilious ascites.   - Placement of transhepatic 55F perc  cholecystostomy tube. - 7/8 Dr. Archer AsaMcCullough Exchange of perc chole tube and dew drain placed into left abdomen, 40F biliary drain with multiple side holes. Aspiration nearly 1L green billious fluid with debris.   Antimicrobials:  Cefepime >> Zosyn x1 7/8  Subjective: Back from procedure which she tolerated well. She's tired but in no significant pain. On room air, denies chest pain, dyspnea, palpitations. Abdominal soreness is stable. No N/V/D. Walking in the halls more often.   Objective: Vitals:   04/16/20 0900 04/16/20 0905 04/16/20 0910 04/16/20 1232  BP: (!) 149/96 (!) 145/96 (!) 153/93 131/60  Pulse: 85 88 87 87  Resp: 14 18 15  (!) 23  Temp:    97.8 F (36.6 C)  TempSrc:    Oral  SpO2: 95% 97% 97% 90%  Weight:      Height:        Intake/Output Summary (Last 24 hours) at 04/16/2020 1453 Last data filed at 04/16/2020 1440 Gross per 24 hour  Intake 460 ml  Output 670 ml  Net -210 ml   Filed Weights   04/10/20 0444 04/12/20 0500 04/14/20 0500  Weight: (!) 148.8 kg (!) 150 kg (!) 150 kg   Gen: 39 y.o. female in no distress Pulm: Nonlabored breathing room air. Clear. CV: Regular rate and rhythm. No murmur, rub, or gallop. No JVD, no dependent edema. GI: Abdomen soft, minimally tender with drains in left and right abdomen c/d/i. Normoactive bowel sounds.  Ext: Warm, no deformities Skin: No rashes, lesions or ulcers on visualized skin. Neuro: Alert and oriented. No focal neurological deficits. Psych: Judgement and insight appear fair. Mood euthymic & affect congruent. Behavior is appropriate.    Data Reviewed: I have personally reviewed following labs and imaging studies  CBC: Recent Labs  Lab 04/11/20 0240 04/12/20 0528 04/13/20 0458 04/15/20 0434 04/16/20 0332  WBC 30.3* 23.1*  21.9* 18.2* 15.7*  NEUTROABS  --   --   --   --  10.0*  HGB 12.6 12.3 11.6* 12.6 11.5*  HCT 41.9 40.6 38.4 40.2 37.0  MCV 85.7 84.8 84.4 82.2 83.1  PLT 391 368 327 219 255   Basic Metabolic Panel: Recent Labs  Lab 04/11/20 0240 04/12/20 0528 04/13/20 0458 04/14/20 1158 04/16/20 0332  NA 134* 138 136 139 140  K 4.2 4.4 4.1 4.2 3.9  CL 101 102 102 104 104  CO2 24 23 25 26 27   GLUCOSE 107* 105* 105* 126* 102*  BUN 13 11 10 10 11   CREATININE 0.63 0.59 0.59 0.70 0.64  CALCIUM 8.3* 8.2* 8.2* 8.3* 8.4*   GFR: Estimated Creatinine Clearance: 131.6 mL/min (by C-G formula based on SCr of 0.64 mg/dL). Liver Function Tests: Recent Labs  Lab 04/11/20 0240 04/12/20 0528 04/13/20 0458 04/14/20 1158 04/16/20 0332  AST 39 26 26 29 23   ALT 35 27 24 25 21   ALKPHOS 109 88 81 83 67  BILITOT 1.1 0.8 0.6 0.8 0.8  PROT 5.7* 5.2* 5.0* 5.5* 5.4*  ALBUMIN 2.1* 2.1* 2.1* 2.3* 2.3*   No results for input(s): LIPASE, AMYLASE in the last 168 hours. No results for input(s): AMMONIA in the last 168 hours. Coagulation Profile: No results for input(s): INR, PROTIME in the last 168 hours. Cardiac Enzymes: No results for input(s): CKTOTAL, CKMB, CKMBINDEX, TROPONINI in the last 168 hours. BNP (last 3 results) No results for input(s): PROBNP in the last 8760 hours. HbA1C: No results for input(s): HGBA1C in the last 72 hours. CBG: Recent Labs  Lab 04/15/20 1652  04/15/20 2138 04/16/20 0731 04/16/20 1126 04/16/20 1209  GLUCAP 102* 111* 101* 66* 83   Lipid Profile: No results for input(s): CHOL, HDL, LDLCALC, TRIG, CHOLHDL, LDLDIRECT in the last 72 hours. Thyroid Function Tests: No results for input(s): TSH, T4TOTAL, FREET4, T3FREE, THYROIDAB in the last 72 hours. Anemia Panel: No results for input(s): VITAMINB12, FOLATE, FERRITIN, TIBC, IRON, RETICCTPCT in the last 72 hours. Urine analysis: No results found for: COLORURINE, APPEARANCEUR, LABSPEC, PHURINE, GLUCOSEU, HGBUR, BILIRUBINUR,  KETONESUR, PROTEINUR, UROBILINOGEN, NITRITE, LEUKOCYTESUR Recent Results (from the past 240 hour(s))  Body fluid culture     Status: None   Collection Time: 04/08/20  3:18 PM   Specimen: Peritoneal Cavity; Body Fluid  Result Value Ref Range Status   Specimen Description   Final    PERITONEAL CAVITY Performed at [facility], [address], [place], [place] [address]    Special Requests   Final    Normal Performed at [facility], [address] [place], [place] [address]    Gram Stain NO WBC SEEN NO ORGANISMS SEEN   Final   Culture   Final    NO GROWTH 3 DAYS Performed at [facility], [address] [place], [place] [address]    Report Status 04/11/2020 FINAL  Final      Radiology Studies: CT ABDOMEN PELVIS WO CONTRAST  Result Date: 04/14/2020 CLINICAL DATA:  39 year old female with cholecystitis status post percutaneous cholecystostomy. Continued leukocytosis. EXAM: CT ABDOMEN AND PELVIS WITHOUT CONTRAST TECHNIQUE: Multidetector CT imaging of the abdomen and pelvis was performed following the standard protocol without IV contrast. COMPARISON:  CT abdomen pelvis dated 04/02/2020. FINDINGS: Evaluation of this exam is limited in the absence of intravenous contrast. Lower chest: There are trace bilateral pleural effusions with bibasilar patchy densities which may represent atelectasis or infiltrate. Clinical correlation is recommended. There is mild eventration of the left hemidiaphragm. Coronary vascular calcifications noted. No intra-abdominal free air. There is a large fluid collection lateral to the spleen extending inferiorly to the anterior pelvis. This fluid measures approximately 11 x 13 cm in axial dimensions and 34 cm in craniocaudal length. There is associated mass effect on the peritoneal structures. In addition there is a smaller fluid in the right abdomen extending from the subhepatic area inferiorly. Hepatobiliary: The liver is  unremarkable. No intrahepatic biliary ductal dilatation. The gallbladder is contracted. A percutaneous cholecystostomy is noted with pigtail tip in the region of the gallbladder fundus. No calcified gallstone. Pancreas: Unremarkable. No pancreatic ductal dilatation or surrounding inflammatory changes. Spleen: The spleen is mildly displaced medially secondary to mass effect caused by adjacent fluid. The spleen is otherwise unremarkable. Adrenals/Urinary Tract: The adrenal glands unremarkable. There is no hydronephrosis or nephrolithiasis on either side. The visualized ureters and urinary bladder appear unremarkable. Stomach/Bowel: There is no bowel obstruction or active inflammation. There is displacement of the bowel by the abdominal fluid collection. Vascular/Lymphatic: The abdominal aorta and IVC are grossly unremarkable. No portal venous gas. There is no adenopathy. Reproductive: The uterus is anteverted and grossly unremarkable. No adnexal masses. Other: There is subcutaneous edema. Musculoskeletal: Mild degenerative changes of the spine. No acute osseous pathology. IMPRESSION: 1. Large fluid collection lateral to the spleen extending inferiorly to the anterior pelvis with associated mass effect on the peritoneal structures. 2. Percutaneous cholecystostomy with pigtail tip in the region of the gallbladder fundus. 3. No bowel obstruction. 4. Trace bilateral pleural effusions with bibasilar patchy densities which may represent atelectasis or infiltrate.  Clinical correlation is recommended. 5. Aortic Atherosclerosis (ICD10-I70.0). Electronically Signed   By: Elgie Collard M.D.   On: 04/14/2020 19:56    Scheduled Meds: . (feeding supplement) PROSource Plus  30 mL Oral BID BM  . Chlorhexidine Gluconate Cloth  6 each Topical Daily  . [START ON 04/17/2020] enoxaparin (LOVENOX) injection  75 mg Subcutaneous Q24H  . fentaNYL      . insulin aspart  0-6 Units Subcutaneous TID WC  . levothyroxine  75 mcg Oral  Q0600  . lidocaine      . mouth rinse  15 mL Mouth Rinse BID  . midazolam      . morphine      . multivitamins with iron  1 tablet Oral Daily  . polyethylene glycol  17 g Oral Daily  . Ensure Max Protein  11 oz Oral Daily  . sodium chloride flush  10-40 mL Intracatheter Q12H  . sodium chloride flush  5 mL Intracatheter Q8H   Continuous Infusions: . sodium chloride Stopped (04/05/20 1702)  . ceFEPime (MAXIPIME) IV 2 g (04/16/20 1339)     LOS: 13 days   Time spent: 25 minutes.  Tyrone Nine, MD [facility] [email or web address] 04/16/2020, 2:53 PM

## 2020-04-16 NOTE — Procedures (Signed)
Interventional Radiology Procedure Note  Procedure:  1.) Exchange of perc chole tube 2.) New drain placed into the left abdomen, 48F biliary drain with multiple side holes.  Aspiration yields nearly 1 L of green bilious fluid with debris.   Complications: None  Estimated Blood Loss: None  Recommendations: - Drains to bag drainage   Signed,  Sterling Big, MD

## 2020-04-17 LAB — CBC WITH DIFFERENTIAL/PLATELET
Abs Immature Granulocytes: 2.09 10*3/uL — ABNORMAL HIGH (ref 0.00–0.07)
Basophils Absolute: 0.2 10*3/uL — ABNORMAL HIGH (ref 0.0–0.1)
Basophils Relative: 1 %
Eosinophils Absolute: 0.3 10*3/uL (ref 0.0–0.5)
Eosinophils Relative: 2 %
HCT: 36.8 % (ref 36.0–46.0)
Hemoglobin: 11.1 g/dL — ABNORMAL LOW (ref 12.0–15.0)
Immature Granulocytes: 16 %
Lymphocytes Relative: 15 %
Lymphs Abs: 2 10*3/uL (ref 0.7–4.0)
MCH: 25.5 pg — ABNORMAL LOW (ref 26.0–34.0)
MCHC: 30.2 g/dL (ref 30.0–36.0)
MCV: 84.6 fL (ref 80.0–100.0)
Monocytes Absolute: 1.2 10*3/uL — ABNORMAL HIGH (ref 0.1–1.0)
Monocytes Relative: 9 %
Neutro Abs: 7.5 10*3/uL (ref 1.7–7.7)
Neutrophils Relative %: 57 %
Platelets: 221 10*3/uL (ref 150–400)
RBC: 4.35 MIL/uL (ref 3.87–5.11)
RDW: 15.7 % — ABNORMAL HIGH (ref 11.5–15.5)
WBC Morphology: INCREASED
WBC: 13.3 10*3/uL — ABNORMAL HIGH (ref 4.0–10.5)
nRBC: 0 % (ref 0.0–0.2)

## 2020-04-17 LAB — COMPREHENSIVE METABOLIC PANEL
ALT: 19 U/L (ref 0–44)
AST: 22 U/L (ref 15–41)
Albumin: 2.3 g/dL — ABNORMAL LOW (ref 3.5–5.0)
Alkaline Phosphatase: 63 U/L (ref 38–126)
Anion gap: 10 (ref 5–15)
BUN: 9 mg/dL (ref 6–20)
CO2: 26 mmol/L (ref 22–32)
Calcium: 8.5 mg/dL — ABNORMAL LOW (ref 8.9–10.3)
Chloride: 104 mmol/L (ref 98–111)
Creatinine, Ser: 0.62 mg/dL (ref 0.44–1.00)
GFR calc Af Amer: >60 mL/min (ref >60)
GFR calc non Af Amer: >60 mL/min (ref >60)
Glucose, Bld: 94 mg/dL (ref 70–99)
Potassium: 4.1 mmol/L (ref 3.5–5.1)
Sodium: 140 mmol/L (ref 135–145)
Total Bilirubin: 0.8 mg/dL (ref 0.3–1.2)
Total Protein: 5.2 g/dL — ABNORMAL LOW (ref 6.5–8.1)

## 2020-04-17 MED ORDER — PRO-STAT SUGAR FREE PO LIQD
30.0000 mL | Freq: Two times a day (BID) | ORAL | Status: DC
  Administered 2020-04-17 – 2020-04-19 (×5): 30 mL via ORAL
  Filled 2020-04-17 (×5): qty 30

## 2020-04-17 NOTE — Progress Notes (Signed)
Referring Physician(s): Dr. Donell Beers  Supervising Physician: Richarda Overlie  Patient Status:  [facility] - In-pt  Chief Complaint: Acute pancreatitis, cholelithiasis, choecystitis  Subjective: Patient reports abdominal pain this AM.  Getting pain medication. Right and left drains in place. No complaints related to drains.    Allergies: Patient has no known allergies.  Medications: Prior to Admission medications   Medication Sig Start Date End Date Taking? Authorizing Provider  levothyroxine (SYNTHROID) 125 MCG tablet Take 125 mcg by mouth daily before breakfast.   Yes [provider]     Vital Signs: BP 107/75 (BP Location: Right Arm)   Pulse 92   Temp 98.6 F (37 C) (Oral)   Resp (!) 23   Ht 5\' 2"  (1.575 m)   Wt (!) 320 lb 12.3 oz (145.5 kg)   SpO2 94%   BMI 58.67 kg/m   Physical Exam  NAD, alert Abdomen: RUQ drain in place.  Flushes easily with aspiration of green, bilious material.  Insertion site with some drainage, no blood.  Left lateral drain in place with thick, bright bilious output.  Flushes easily.  Insertion site c/d/i.  Imaging: CT ABDOMEN PELVIS WO CONTRAST  Result Date: 04/14/2020 CLINICAL DATA:  39 year old female with cholecystitis status post percutaneous cholecystostomy. Continued leukocytosis. EXAM: CT ABDOMEN AND PELVIS WITHOUT CONTRAST TECHNIQUE: Multidetector CT imaging of the abdomen and pelvis was performed following the standard protocol without IV contrast. COMPARISON:  CT abdomen pelvis dated 04/02/2020. FINDINGS: Evaluation of this exam is limited in the absence of intravenous contrast. Lower chest: There are trace bilateral pleural effusions with bibasilar patchy densities which may represent atelectasis or infiltrate. Clinical correlation is recommended. There is mild eventration of the left hemidiaphragm. Coronary vascular calcifications noted. No intra-abdominal free air. There is a large fluid collection lateral to the spleen extending  inferiorly to the anterior pelvis. This fluid measures approximately 11 x 13 cm in axial dimensions and 34 cm in craniocaudal length. There is associated mass effect on the peritoneal structures. In addition there is a smaller fluid in the right abdomen extending from the subhepatic area inferiorly. Hepatobiliary: The liver is unremarkable. No intrahepatic biliary ductal dilatation. The gallbladder is contracted. A percutaneous cholecystostomy is noted with pigtail tip in the region of the gallbladder fundus. No calcified gallstone. Pancreas: Unremarkable. No pancreatic ductal dilatation or surrounding inflammatory changes. Spleen: The spleen is mildly displaced medially secondary to mass effect caused by adjacent fluid. The spleen is otherwise unremarkable. Adrenals/Urinary Tract: The adrenal glands unremarkable. There is no hydronephrosis or nephrolithiasis on either side. The visualized ureters and urinary bladder appear unremarkable. Stomach/Bowel: There is no bowel obstruction or active inflammation. There is displacement of the bowel by the abdominal fluid collection. Vascular/Lymphatic: The abdominal aorta and IVC are grossly unremarkable. No portal venous gas. There is no adenopathy. Reproductive: The uterus is anteverted and grossly unremarkable. No adnexal masses. Other: There is subcutaneous edema. Musculoskeletal: Mild degenerative changes of the spine. No acute osseous pathology. IMPRESSION: 1. Large fluid collection lateral to the spleen extending inferiorly to the anterior pelvis with associated mass effect on the peritoneal structures. 2. Percutaneous cholecystostomy with pigtail tip in the region of the gallbladder fundus. 3. No bowel obstruction. 4. Trace bilateral pleural effusions with bibasilar patchy densities which may represent atelectasis or infiltrate. Clinical correlation is recommended. 5. Aortic Atherosclerosis (ICD10-I70.0). Electronically Signed   By: 04/04/2020 M.D.   On:  04/14/2020 19:56   IR EXCHANGE BILIARY DRAIN  Result Date: 04/16/2020  INDICATION: 39 year old female with acute calculus cholecystitis. A percutaneous cholecystostomy tube was placed on 04/08/2020. Her gallbladder is densely packed with stones and the tube could just be formed within the gallbladder fundus. Recent CT imaging demonstrates that the tube is slowly migrating out of the gallbladder. She presents for tube repositioning and exchange. EXAM: Cholecystostomy tube exchange MEDICATIONS: 3.375 g Zosyn; The antibiotic was administered within an appropriate time frame prior to the initiation of the procedure. ANESTHESIA/SEDATION: Moderate (conscious) sedation was employed during this procedure. A total of Versed 2 mg and Fentanyl 100 mcg was administered intravenously. Moderate Sedation Time: 27 minutes. The patient's level of consciousness and vital signs were monitored continuously by radiology nursing throughout the procedure under my direct supervision. FLUOROSCOPY TIME:  Fluoroscopy Time: 0 minutes 48 seconds (7 mGy). COMPLICATIONS: None immediate. PROCEDURE: Informed written consent was obtained from the patient after a thorough discussion of the procedural risks, benefits and alternatives. All questions were addressed. Maximal Sterile Barrier Technique was utilized including caps, mask, sterile gowns, sterile gloves, sterile drape, hand hygiene and skin antiseptic. A timeout was performed prior to the initiation of the procedure. I hand injection of contrast material through the existing tube demonstrates that the tube remains within the gallbladder lumen. The gallbladder remains packed with stones. The tube was cut and removed over an Amplatz wire. The wire was manipulated in till it extended toward the neck of the gallbladder. A new Cook 10.2 [place] all-purpose drainage catheter was then advanced over the wire and formed in the region of the gallbladder neck. The tube was secured to the skin with 0  Prolene suture. An image was obtained and stored for the medical record. The patient tolerated the procedure well. IMPRESSION: Successful exchange and reposition for a new 10.2 French percutaneous transhepatic cholecystostomy tube. The tube is now position deeper within the gallbladder lumen nearly at the gallbladder neck. Electronically Signed   By: Malachy Moan M.D.   On: 04/16/2020 15:04   IR IMAGE GUIDED DRAINAGE BY PERCUTANEOUS CATHETER  Result Date: 04/16/2020 INDICATION: 39 year old female with acute calculus cholecystitis and gallstone pancreatitis complicated by intra-abdominal fluid collections. Recent paracentesis performed 04/08/2020 yielded several L of bilious ascites. She now has a large loculated fluid collection in the left hemiabdomen and presents for drain placement. EXAM: Drain placement utilizing ultrasound guidance MEDICATIONS: The patient is currently admitted to the hospital and receiving intravenous antibiotics. The antibiotics were administered within an appropriate time frame prior to the initiation of the procedure. ANESTHESIA/SEDATION: None COMPLICATIONS: None immediate. PROCEDURE: Informed written consent was obtained from the patient after a thorough discussion of the procedural risks, benefits and alternatives. All questions were addressed. Maximal Sterile Barrier Technique was utilized including caps, mask, sterile gowns, sterile gloves, sterile drape, hand hygiene and skin antiseptic. A timeout was performed prior to the initiation of the procedure. The left upper quadrant was interrogated with ultrasound. A large complex fluid collection is identified. Local anesthesia was attained by infiltration with 1% lidocaine. A small dermatotomy was made. Under real-time ultrasound guidance, the fluid collection was accessed with a 15 cm 18 gauge trocar needle. A wire was advanced into the fluid collection. The skin tract was then dilated to 12 [place]. A Cook 12 [place] biliary  drainage catheter was then advanced over the wire and formed. Aspiration yields nearly 1 L of dark green bilious fluid containing debris. The catheter was secured to the skin with 0 Prolene suture. IMPRESSION: Successful placement of a 74 French biliary drainage catheter  into the large left abdominal fluid collection. Aspiration yields nearly 1 L of dark green bilious fluid and debris. Electronically Signed   By: Malachy Moan M.D.   On: 04/16/2020 15:06    Labs:  CBC: Recent Labs    04/13/20 0458 04/15/20 0434 04/16/20 0332 04/17/20 0352  WBC 21.9* 18.2* 15.7* 13.3*  HGB 11.6* 12.6 11.5* 11.1*  HCT 38.4 40.2 37.0 36.8  PLT 327 219 255 221    COAGS: Recent Labs    04/02/20 1847 04/08/20 0132  INR  --  1.2  APTT 26  --     BMP: Recent Labs    04/13/20 0458 04/14/20 1158 04/16/20 0332 04/17/20 0352  NA 136 139 140 140  K 4.1 4.2 3.9 4.1  CL 102 104 104 104  CO2 25 26 27 26   GLUCOSE 105* 126* 102* 94  BUN 10 10 11 9   CALCIUM 8.2* 8.3* 8.4* 8.5*  CREATININE 0.59 0.70 0.64 0.62  GFRNONAA >60 >60 >60 >60  GFRAA >60 >60 >60 >60    LIVER FUNCTION TESTS: Recent Labs    04/13/20 0458 04/14/20 1158 04/16/20 0332 04/17/20 0352  BILITOT 0.6 0.8 0.8 0.8  AST 26 29 23 22   ALT 24 25 21 19   ALKPHOS 81 83 67 63  PROT 5.0* 5.5* 5.4* 5.2*  ALBUMIN 2.1* 2.3* 2.3* 2.3*    Assessment and Plan: Calculous cholecystitis s/p cholecystostomy tube placement 6/30 with exchange/repositioning 7/8 S/p loculated fluid collection with drain placement in the left lateral abdomen Cholecystostomy tube in place.  Insertion site with minimal drainage.  Flushes easily.  Left lateral drain in place with thick bilious output.   Both drains will need to be flushed at discharge.  Placed discharge care orders.   Plan for CT Abdomen Pelvis with contrast for left lateral drain assessment as well as cholecystostomy tube injection in 10-14 days.  Early cholecystostomy injection indicated  based on amount of stones with early drain exchange as an inpatient due to tube dislodgement.   Schedulers will contact patient with date and time of appointment.   Electronically Signed: , PA 04/17/2020, 3:26 PM   I spent a total of 15 Minutes at the the patient's bedside AND on the patient's hospital floor or unit, greater than 50% of which was counseling/coordinating care for cholecystitis.

## 2020-04-17 NOTE — Progress Notes (Signed)
PROGRESS NOTE  Amber May  KNL:[id number] DOB: Oct 28, 1980 DOA: 04/02/2020 PCP: Patient, No Pcp Per   Brief Narrative: Amber May is a 39 y.o. female with a history of morbid obesity (BMI 60), and hypothyroidism who presented 6/24 with abdominal pain and fever found to have SIRS due to severe acute pancreatitis treated with empiric antibiotics, aggressive IV fluids, seen by gastroenterology and general surgery.Further work-up concerning for acute cholecystitis for which percutaneous cholecystostomy drain was placed 6/30 at the same time as 4L paracentesis. With continued supportive care, leukocytosis has improved but not resolved. CT abdomen/pelvis ordered 7/6 showed large left-sided abdominal fluid collection for which IR was reconsulted. They performed exchange of percutaneous cholecystostomy tube and placement of left abdominal drain with aspiration of nearly 1L green bilious fluid with debris on 7/8. Drain output has continued to be significant with thick bilious material.   Assessment & Plan: Principal Problem:   Acute pancreatitis Active Problems:   SIRS (systemic inflammatory response syndrome) (HCC)   AKI (acute kidney injury) (HCC)   Hypothyroidism   Hyperglycemia   Elevated liver enzymes   Acute respiratory failure with hypoxemia (HCC)   Acute cholecystitis   Hypocalcemia   Hypophosphatemia   Morbid obesity with BMI of 50.0-59.9, adult (HCC)   Cholelithiasis   Epigastric abdominal pain   Acute pancreatitis, calculous cholecystitis: LFTs normalized s/p percutaneous cholecystostomy drain 6/30. No choledocholithiasis on MRCP, HIDA with nonvisualization of GB. Cultures negative to date. Presumed to be gallstone pancreatitis without EtOH history or severe hypertriglyceridemia.   - Cefepime continued. Will follow clinical response and leukocytosis now that fluid has been drained. Has had prolonged course of cefepime.  - Surgery planning follow up with bariatric surgeon after  discharge for eventual cholecystectomy.  - Cholecystostomy drain care understood by pt's mother who will care for them. - s/p paracentesis and cholecystostomy drain placement on 04/08/2020. 4.8L bilious ascites was drawn off and 48F drain was placed - Diet tolerated.  Intraabdominal fluid collection:  - As above, continuing IV abx. - Drain placed 7/8, care per IR. Planning CT in 10-14 days which they will schedule with patient.  Acute hypoxic respiratory failure, multifactorial: Secondary to acute pancreatitis/cholecystitis, splinting from abdominal pain, and polypharmacy. With some nocturnal desaturations, suspect undiagnosed OSA, probable OHS with atelectasis from prolonged hospitalization.  - Continue to wean from oxygen, recommend sleep study as outpatient.  - Will touch base with surgery about cholecystectomy with more stable pulmonary status at this point.  Hypocalcemia, hypophosphatemia: Improved.  - Continue monitoring with advancing diet.   AKI: Resolved.   Hyperglycemia: due to stress/infection/acute pancreatitis. Hemoglobin A1c 5.4%.  - No insulin required in past 6 days. Will stop CBG checks.   Ambulatory dysfunction: Improving. Continue PT/OT efforts.   Hepatomegaly and hepatic steatosis: LFTs normalized.   Abnormal low-density in the lower uterine segment/cervix spanning 18 mm, nonspecific. - Recommend nonemergent pelvic ultrasound after discharge.  Morbid obesity with concurrent moderate protein caloric malnutrition. - Body mass index is 58.67 kg/m.  - Supplement protein, dietitian consulted.   Hypothyroidism: Resume home dose synthroid.   DVT prophylaxis: Lovenox Code Status: Full Family Communication: None at bedside; spoke with mother by phone at bedside. Disposition Plan:  Status is: Inpatient  Remains inpatient appropriate because:Ongoing diagnostic testing needed not appropriate for outpatient work up   Dispo:  Patient From: Home  Planned  Disposition: Home  Expected discharge date: 1-2 days.  Medically stable for discharge: No  Consultants:   IR  Surgery  Procedures:   - Paracentesis yielding 4.8L bilious ascites.   - Placement of transhepatic 31F perc cholecystostomy tube. - 7/8 Dr. Archer Asa Exchange of perc chole tube and dew drain placed into left abdomen, 48F biliary drain with multiple side holes. Aspiration nearly 1L green billious fluid with debris.   Antimicrobials:  Cefepime >> Zosyn x1 7/8  Subjective: Had 1.7L drain output over previous 24 hours. Reporting increase in diffuse abdominal pain no worse with food. Also having discomfort on her bottom where she's laying. No fevers.   Objective: Vitals:   04/16/20 2049 04/17/20 0449 04/17/20 0500 04/17/20 1236  BP: 121/67 117/66  107/75  Pulse: 93 90  92  Resp: (!) 21 20  (!) 23  Temp: 98.5 F (36.9 C) 98.2 F (36.8 C)  98.6 F (37 C)  TempSrc: Oral Oral  Oral  SpO2: 91% 91%  94%  Weight:   (!) 145.5 kg   Height:        Intake/Output Summary (Last 24 hours) at 04/17/2020 1832 Last data filed at 04/17/2020 0646 Gross per 24 hour  Intake 319.87 ml  Output 790 ml  Net -470.13 ml   Filed Weights   04/12/20 0500 04/14/20 0500 04/17/20 0500  Weight: (!) 150 kg (!) 150 kg (!) 145.5 kg   Gen: 39 y.o. female in no distress Pulm: Nonlabored breathing room air. Clear. CV: Regular rate and rhythm. No murmur, rub, or gallop. No JVD, no dependent edema. GI: Abdomen soft, diffusely tender mildly, non-distended, with normoactive bowel sounds.  Ext: Warm, no deformities Skin: No new rashes, lesions or ulcers on visualized skin. Drain sites without exudate, mild discharge. Neuro: Alert and oriented. No focal neurological deficits. Psych: Judgement and insight appear fair. Mood euthymic & affect congruent. Behavior is appropriate.    Data Reviewed: I have personally reviewed following labs and imaging studies  CBC: Recent Labs  Lab 04/12/20 0528  04/13/20 0458 04/15/20 0434 04/16/20 0332 04/17/20 0352  WBC 23.1* 21.9* 18.2* 15.7* 13.3*  NEUTROABS  --   --   --  10.0* 7.5  HGB 12.3 11.6* 12.6 11.5* 11.1*  HCT 40.6 38.4 40.2 37.0 36.8  MCV 84.8 84.4 82.2 83.1 84.6  PLT 368 327 219 255 221   Basic Metabolic Panel: Recent Labs  Lab 04/12/20 0528 04/13/20 0458 04/14/20 1158 04/16/20 0332 04/17/20 0352  NA 138 136 139 140 140  K 4.4 4.1 4.2 3.9 4.1  CL 102 102 104 104 104  CO2 23 25 26 27 26   GLUCOSE 105* 105* 126* 102* 94  BUN 11 10 10 11 9   CREATININE 0.59 0.59 0.70 0.64 0.62  CALCIUM 8.2* 8.2* 8.3* 8.4* 8.5*   GFR: Estimated Creatinine Clearance: 129 mL/min (by C-G formula based on SCr of 0.62 mg/dL). Liver Function Tests: Recent Labs  Lab 04/12/20 0528 04/13/20 0458 04/14/20 1158 04/16/20 0332 04/17/20 0352  AST 26 26 29 23 22   ALT 27 24 25 21 19   ALKPHOS 88 81 83 67 63  BILITOT 0.8 0.6 0.8 0.8 0.8  PROT 5.2* 5.0* 5.5* 5.4* 5.2*  ALBUMIN 2.1* 2.1* 2.3* 2.3* 2.3*   No results for input(s): LIPASE, AMYLASE in the last 168 hours. No results for input(s): AMMONIA in the last 168 hours. Coagulation Profile: No results for input(s): INR, PROTIME in the last 168 hours. Cardiac Enzymes: No results for input(s): CKTOTAL, CKMB, CKMBINDEX, TROPONINI in the last 168 hours. BNP (last 3 results) No results for input(s): PROBNP in the last 8760 hours.  HbA1C: No results for input(s): HGBA1C in the last 72 hours. CBG: Recent Labs  Lab 04/15/20 1652 04/15/20 2138 04/16/20 0731 04/16/20 1126 04/16/20 1209  GLUCAP 102* 111* 101* 66* 83   Lipid Profile: No results for input(s): CHOL, HDL, LDLCALC, TRIG, CHOLHDL, LDLDIRECT in the last 72 hours. Thyroid Function Tests: No results for input(s): TSH, T4TOTAL, FREET4, T3FREE, THYROIDAB in the last 72 hours. Anemia Panel: No results for input(s): VITAMINB12, FOLATE, FERRITIN, TIBC, IRON, RETICCTPCT in the last 72 hours. Urine analysis: No results found for:  COLORURINE, APPEARANCEUR, LABSPEC, PHURINE, GLUCOSEU, HGBUR, BILIRUBINUR, KETONESUR, PROTEINUR, UROBILINOGEN, NITRITE, LEUKOCYTESUR Recent Results (from the past 240 hour(s))  Body fluid culture     Status: None   Collection Time: 04/08/20  3:18 PM   Specimen: Peritoneal Cavity; Body Fluid  Result Value Ref Range Status   Specimen Description   Final    PERITONEAL CAVITY Performed at [facility], [address] [place], [place] [address]    Special Requests   Final    Normal Performed at [facility], [address] [place], [place] [address]    Gram Stain NO WBC SEEN NO ORGANISMS SEEN   Final   Culture   Final    NO GROWTH 3 DAYS Performed at [facility], [address], [place], [place] [address]    Report Status 04/11/2020 FINAL  Final      Radiology Studies: IR EXCHANGE BILIARY DRAIN  Result Date: 04/16/2020 INDICATION: 39 year old female with acute calculus cholecystitis. A percutaneous cholecystostomy tube was placed on 04/08/2020. Her gallbladder is densely packed with stones and the tube could just be formed within the gallbladder fundus. Recent CT imaging demonstrates that the tube is slowly migrating out of the gallbladder. She presents for tube repositioning and exchange. EXAM: Cholecystostomy tube exchange MEDICATIONS: 3.375 g Zosyn; The antibiotic was administered within an appropriate time frame prior to the initiation of the procedure. ANESTHESIA/SEDATION: Moderate (conscious) sedation was employed during this procedure. A total of Versed 2 mg and Fentanyl 100 mcg was administered intravenously. Moderate Sedation Time: 27 minutes. The patient's level of consciousness and vital signs were monitored continuously by radiology nursing throughout the procedure under my direct supervision. FLUOROSCOPY TIME:  Fluoroscopy Time: 0 minutes 48 seconds (7 mGy). COMPLICATIONS: None immediate. PROCEDURE: Informed written consent was obtained  from the patient after a thorough discussion of the procedural risks, benefits and alternatives. All questions were addressed. Maximal Sterile Barrier Technique was utilized including caps, mask, sterile gowns, sterile gloves, sterile drape, hand hygiene and skin antiseptic. A timeout was performed prior to the initiation of the procedure. I hand injection of contrast material through the existing tube demonstrates that the tube remains within the gallbladder lumen. The gallbladder remains packed with stones. The tube was cut and removed over an Amplatz wire. The wire was manipulated in till it extended toward the neck of the gallbladder. A new Cook 10.2 [place] all-purpose drainage catheter was then advanced over the wire and formed in the region of the gallbladder neck. The tube was secured to the skin with 0 Prolene suture. An image was obtained and stored for the medical record. The patient tolerated the procedure well. IMPRESSION: Successful exchange and reposition for a new 10.2 French percutaneous transhepatic cholecystostomy tube. The tube is now position deeper within the gallbladder lumen nearly at the gallbladder neck. Electronically Signed   By: Malachy Moan M.D.   On: 04/16/2020 15:04   IR IMAGE GUIDED  DRAINAGE BY PERCUTANEOUS CATHETER  Result Date: 04/16/2020 INDICATION: 39 year old female with acute calculus cholecystitis and gallstone pancreatitis complicated by intra-abdominal fluid collections. Recent paracentesis performed 04/08/2020 yielded several L of bilious ascites. She now has a large loculated fluid collection in the left hemiabdomen and presents for drain placement. EXAM: Drain placement utilizing ultrasound guidance MEDICATIONS: The patient is currently admitted to the hospital and receiving intravenous antibiotics. The antibiotics were administered within an appropriate time frame prior to the initiation of the procedure. ANESTHESIA/SEDATION: None COMPLICATIONS: None immediate.  PROCEDURE: Informed written consent was obtained from the patient after a thorough discussion of the procedural risks, benefits and alternatives. All questions were addressed. Maximal Sterile Barrier Technique was utilized including caps, mask, sterile gowns, sterile gloves, sterile drape, hand hygiene and skin antiseptic. A timeout was performed prior to the initiation of the procedure. The left upper quadrant was interrogated with ultrasound. A large complex fluid collection is identified. Local anesthesia was attained by infiltration with 1% lidocaine. A small dermatotomy was made. Under real-time ultrasound guidance, the fluid collection was accessed with a 15 cm 18 gauge trocar needle. A wire was advanced into the fluid collection. The skin tract was then dilated to 12 [place]. A Cook 12 [place] biliary drainage catheter was then advanced over the wire and formed. Aspiration yields nearly 1 L of dark green bilious fluid containing debris. The catheter was secured to the skin with 0 Prolene suture. IMPRESSION: Successful placement of a 4712 French biliary drainage catheter into the large left abdominal fluid collection. Aspiration yields nearly 1 L of dark green bilious fluid and debris. Electronically Signed   By: Malachy MoanHeath  McCullough M.D.   On: 04/16/2020 15:06    Scheduled Meds: . Chlorhexidine Gluconate Cloth  6 each Topical Daily  . enoxaparin (LOVENOX) injection  75 mg Subcutaneous Q24H  . feeding supplement (PRO-STAT SUGAR FREE 64)  30 mL Oral BID BM  . levothyroxine  125 mcg Oral Q0600  . mouth rinse  15 mL Mouth Rinse BID  . morphine      . multivitamins with iron  1 tablet Oral Daily  . polyethylene glycol  17 g Oral Daily  . Ensure Max Protein  11 oz Oral Daily  . sodium chloride flush  10-40 mL Intracatheter Q12H  . sodium chloride flush  5 mL Intracatheter Q8H   Continuous Infusions: . sodium chloride Stopped (04/05/20 1702)  . ceFEPime (MAXIPIME) IV 2 g (04/17/20 1359)     LOS: 14  days   Time spent: 25 minutes.  Tyrone Nineyan B Brehanna Deveny, MD [facility] [email or web address] 04/17/2020, 6:32 PM

## 2020-04-17 NOTE — Progress Notes (Signed)
[facility] Progress Note  10 Days Post-Op  Subjective: No new complaints. Tolerating diet and having bowel function.   Objective: Vital signs in last 24 hours: Temp:  [97.8 F (36.6 C)-98.5 F (36.9 C)] 98.2 F (36.8 C) (07/09 0449) Pulse Rate:  [87-93] 90 (07/09 0449) Resp:  [20-23] 20 (07/09 0449) BP: (117-131)/(60-67) 117/66 (07/09 0449) SpO2:  [90 %-91 %] 91 % (07/09 0449) Weight:  [145.5 kg] 145.5 kg (07/09 0500) Last BM Date: 04/14/20  Intake/Output from previous day: 07/08 0701 - 07/09 0700 In: 679.9 [P.O.:480; IV Piggyback:199.9] Out: 2390 [Urine:650; Drains:1740] Intake/Output this shift: No intake/output data recorded.  PE: General: pleasant, WD,morbidly obesewhite female who issitting in chair, NAD HEENT: Sclera areanicteric. PERRL. Ears and nose without any masses or lesions. Mouth is pink and moist Heart: regular, rate, and rhythm. Normal s1,s2. No obvious murmurs, gallops, or rubs noted. Palpable radial and pedal pulses bilaterally Lungs:onroom air,notachypnea, no wheezing Abd: soft,mildly ttp in epigastrium and LUQ,ND, drain in RUQ with scant bilious drainage, drain in LUQ with bilious looking drainage and some air   Lab Results:  Recent Labs    04/16/20 0332 04/17/20 0352  WBC 15.7* 13.3*  HGB 11.5* 11.1*  HCT 37.0 36.8  PLT 255 221   BMET Recent Labs    04/16/20 0332 04/17/20 0352  NA 140 140  K 3.9 4.1  CL 104 104  CO2 27 26  GLUCOSE 102* 94  BUN 11 9  CREATININE 0.64 0.62  CALCIUM 8.4* 8.5*   PT/INR No results for input(s): LABPROT, INR in the last 72 hours. CMP     Component Value Date/Time   NA 140 04/17/2020 0352   K 4.1 04/17/2020 0352   CL 104 04/17/2020 0352   CO2 26 04/17/2020 0352   GLUCOSE 94 04/17/2020 0352   BUN 9 04/17/2020 0352   CREATININE 0.62 04/17/2020 0352   CALCIUM 8.5 (L) 04/17/2020 0352   PROT 5.2 (L) 04/17/2020 0352   ALBUMIN 2.3 (L) 04/17/2020 0352   AST 22 04/17/2020 0352    ALT 19 04/17/2020 0352   ALKPHOS 63 04/17/2020 0352   BILITOT 0.8 04/17/2020 0352   GFRNONAA >60 04/17/2020 0352   GFRAA >60 04/17/2020 0352   Lipase     Component Value Date/Time   LIPASE 33 04/05/2020 0658       Studies/Results: IR EXCHANGE BILIARY DRAIN  Result Date: 04/16/2020 INDICATION: 39 year old female with acute calculus cholecystitis. A percutaneous cholecystostomy tube was placed on 04/08/2020. Her gallbladder is densely packed with stones and the tube could just be formed within the gallbladder fundus. Recent CT imaging demonstrates that the tube is slowly migrating out of the gallbladder. She presents for tube repositioning and exchange. EXAM: Cholecystostomy tube exchange MEDICATIONS: 3.375 g Zosyn; The antibiotic was administered within an appropriate time frame prior to the initiation of the procedure. ANESTHESIA/SEDATION: Moderate (conscious) sedation was employed during this procedure. A total of Versed 2 mg and Fentanyl 100 mcg was administered intravenously. Moderate Sedation Time: 27 minutes. The patient's level of consciousness and vital signs were monitored continuously by radiology nursing throughout the procedure under my direct supervision. FLUOROSCOPY TIME:  Fluoroscopy Time: 0 minutes 48 seconds (7 mGy). COMPLICATIONS: None immediate. PROCEDURE: Informed written consent was obtained from the patient after a thorough discussion of the procedural risks, benefits and alternatives. All questions were addressed. Maximal Sterile Barrier Technique was utilized including caps, mask, sterile gowns, sterile gloves, sterile drape, hand hygiene and skin antiseptic. A timeout was  performed prior to the initiation of the procedure. I hand injection of contrast material through the existing tube demonstrates that the tube remains within the gallbladder lumen. The gallbladder remains packed with stones. The tube was cut and removed over an Amplatz wire. The wire was manipulated in  till it extended toward the neck of the gallbladder. A new Cook 10.2 [place] all-purpose drainage catheter was then advanced over the wire and formed in the region of the gallbladder neck. The tube was secured to the skin with 0 Prolene suture. An image was obtained and stored for the medical record. The patient tolerated the procedure well. IMPRESSION: Successful exchange and reposition for a new 10.2 French percutaneous transhepatic cholecystostomy tube. The tube is now position deeper within the gallbladder lumen nearly at the gallbladder neck. Electronically Signed   By: Malachy Moan M.D.   On: 04/16/2020 15:04   IR IMAGE GUIDED DRAINAGE BY PERCUTANEOUS CATHETER  Result Date: 04/16/2020 INDICATION: 39 year old female with acute calculus cholecystitis and gallstone pancreatitis complicated by intra-abdominal fluid collections. Recent paracentesis performed 04/08/2020 yielded several L of bilious ascites. She now has a large loculated fluid collection in the left hemiabdomen and presents for drain placement. EXAM: Drain placement utilizing ultrasound guidance MEDICATIONS: The patient is currently admitted to the hospital and receiving intravenous antibiotics. The antibiotics were administered within an appropriate time frame prior to the initiation of the procedure. ANESTHESIA/SEDATION: None COMPLICATIONS: None immediate. PROCEDURE: Informed written consent was obtained from the patient after a thorough discussion of the procedural risks, benefits and alternatives. All questions were addressed. Maximal Sterile Barrier Technique was utilized including caps, mask, sterile gowns, sterile gloves, sterile drape, hand hygiene and skin antiseptic. A timeout was performed prior to the initiation of the procedure. The left upper quadrant was interrogated with ultrasound. A large complex fluid collection is identified. Local anesthesia was attained by infiltration with 1% lidocaine. A small dermatotomy was made.  Under real-time ultrasound guidance, the fluid collection was accessed with a 15 cm 18 gauge trocar needle. A wire was advanced into the fluid collection. The skin tract was then dilated to 12 [place]. A Cook 12 [place] biliary drainage catheter was then advanced over the wire and formed. Aspiration yields nearly 1 L of dark green bilious fluid containing debris. The catheter was secured to the skin with 0 Prolene suture. IMPRESSION: Successful placement of a 45 French biliary drainage catheter into the large left abdominal fluid collection. Aspiration yields nearly 1 L of dark green bilious fluid and debris. Electronically Signed   By: Malachy Moan M.D.   On: 04/16/2020 15:06    Anti-infectives: Anti-infectives (From admission, onward)   Start     Dose/Rate Route Frequency Ordered Stop   04/16/20 0810  piperacillin-tazobactam (ZOSYN) IVPB 3.375 g        3.375 g 100 mL/hr over 30 Minutes Intravenous  Once 04/16/20 0809 04/16/20 0900   04/04/20 1300  vancomycin (VANCOREADY) IVPB 1500 mg/300 mL  Status:  Discontinued        1,500 mg 150 mL/hr over 120 Minutes Intravenous Every 12 hours 04/04/20 1014 04/06/20 1157   04/03/20 2000  vancomycin (VANCOREADY) IVPB 1750 mg/350 mL  Status:  Discontinued        1,750 mg 175 mL/hr over 120 Minutes Intravenous Every 24 hours 04/02/20 1937 04/03/20 1157   04/03/20 1400  ceFEPIme (MAXIPIME) 2 g in sodium chloride 0.9 % 100 mL IVPB     Discontinue     2 g 200  mL/hr over 30 Minutes Intravenous Every 8 hours 04/03/20 1157     04/03/20 1300  vancomycin (VANCOREADY) IVPB 1250 mg/250 mL  Status:  Discontinued        1,250 mg 166.7 mL/hr over 90 Minutes Intravenous Every 12 hours 04/03/20 1157 04/04/20 1014   04/03/20 0600  ceFEPIme (MAXIPIME) 2 g in sodium chloride 0.9 % 100 mL IVPB  Status:  Discontinued        2 g 200 mL/hr over 30 Minutes Intravenous Every 12 hours 04/02/20 1937 04/03/20 1157   04/02/20 1930  vancomycin (VANCOCIN) IVPB 1000 mg/200 mL premix         1,000 mg 200 mL/hr over 60 Minutes Intravenous Every hour 04/02/20 1900 04/02/20 2129   04/02/20 1845  ceFEPIme (MAXIPIME) 2 g in sodium chloride 0.9 % 100 mL IVPB        2 g 200 mL/hr over 30 Minutes Intravenous  Once 04/02/20 1840 04/02/20 1944   04/02/20 1845  metroNIDAZOLE (FLAGYL) IVPB 500 mg        500 mg 100 mL/hr over 60 Minutes Intravenous  Once 04/02/20 1840 04/02/20 2228   04/02/20 1845  vancomycin (VANCOCIN) IVPB 1000 mg/200 mL premix  Status:  Discontinued        1,000 mg 200 mL/hr over 60 Minutes Intravenous  Once 04/02/20 1840 04/02/20 1900       Assessment/Plan Acute hypoxic respiratory failure- multifactorial, improving E. Coli UTI Hyperglycemia Hepatomegaly/hepatic steatosis Morbid obesity BMI 61.9   Acute pancreatitis, cholelithiasis,cholecystitis  -Tbili normalized and LFTs within normal limits -MRCP 6/25 negative for choledocholithiasis - WBC slowly improving,13 this AM - HIDAwith non-vis of gallbladder -laparoscopic cholecystectomy canceled by anesthesia for pulmonary issues 6/29 - IR drain 6/30; IRparacentesis yielding 4.8 L of bilious appearing ascites- culturewithout any growth, cytology and Tbili consistent with bilious ascites  - CT 7/6 with large perisplenic collection and small amount fluid in subhepatic space, cholecystostomy seems to be in place, pancreas seems improved - s/p IR drain exchange for cholecystostomy and IR drainage of LUQ collection today - no indication for emergent surgical intervention, I think this patient could go home over the weekend if drain output comes down and follow up in IR drain clinic and with Dr. Daphine Deutscher in the office to discuss eventual cholecystectomy   FEN:HH diet ID: Vancomycin 6/24-6/26; cefepime 6/26 >> DVT: Lovenox  LOS: 14 days    Juliet Rude , [facility] Surgery 04/17/2020, 10:49 AM Please see Amion for pager number during day hours 7:00am-4:30pm

## 2020-04-18 LAB — COMPREHENSIVE METABOLIC PANEL
ALT: 19 U/L (ref 0–44)
AST: 21 U/L (ref 15–41)
Albumin: 2.7 g/dL — ABNORMAL LOW (ref 3.5–5.0)
Alkaline Phosphatase: 61 U/L (ref 38–126)
Anion gap: 10 (ref 5–15)
BUN: 10 mg/dL (ref 6–20)
CO2: 24 mmol/L (ref 22–32)
Calcium: 8.5 mg/dL — ABNORMAL LOW (ref 8.9–10.3)
Chloride: 105 mmol/L (ref 98–111)
Creatinine, Ser: 0.61 mg/dL (ref 0.44–1.00)
GFR calc Af Amer: >60 mL/min (ref >60)
GFR calc non Af Amer: >60 mL/min (ref >60)
Glucose, Bld: 97 mg/dL (ref 70–99)
Potassium: 4.4 mmol/L (ref 3.5–5.1)
Sodium: 139 mmol/L (ref 135–145)
Total Bilirubin: 0.8 mg/dL (ref 0.3–1.2)
Total Protein: 5.8 g/dL — ABNORMAL LOW (ref 6.5–8.1)

## 2020-04-18 LAB — CBC WITH DIFFERENTIAL/PLATELET
Abs Immature Granulocytes: 0.3 10*3/uL — ABNORMAL HIGH (ref 0.00–0.07)
Band Neutrophils: 4 %
Basophils Absolute: 0 10*3/uL (ref 0.0–0.1)
Basophils Relative: 0 %
Eosinophils Absolute: 0 10*3/uL (ref 0.0–0.5)
Eosinophils Relative: 0 %
HCT: 39.3 % (ref 36.0–46.0)
Hemoglobin: 11.8 g/dL — ABNORMAL LOW (ref 12.0–15.0)
Lymphocytes Relative: 25 %
Lymphs Abs: 3.2 10*3/uL (ref 0.7–4.0)
MCH: 25.3 pg — ABNORMAL LOW (ref 26.0–34.0)
MCHC: 30 g/dL (ref 30.0–36.0)
MCV: 84.3 fL (ref 80.0–100.0)
Monocytes Absolute: 1.3 10*3/uL — ABNORMAL HIGH (ref 0.1–1.0)
Monocytes Relative: 10 %
Myelocytes: 2 %
Neutro Abs: 8.1 10*3/uL — ABNORMAL HIGH (ref 1.7–7.7)
Neutrophils Relative %: 59 %
Platelets: 246 10*3/uL (ref 150–400)
RBC: 4.66 MIL/uL (ref 3.87–5.11)
RDW: 15.9 % — ABNORMAL HIGH (ref 11.5–15.5)
WBC: 12.9 10*3/uL — ABNORMAL HIGH (ref 4.0–10.5)
nRBC: 0.2 % (ref 0.0–0.2)

## 2020-04-18 NOTE — Plan of Care (Signed)

## 2020-04-18 NOTE — Progress Notes (Signed)
PROGRESS NOTE  Amber May  QJF:[id number] DOB: [birth year] DOA: 04/02/2020 PCP: Patient, No Pcp Per   Brief Narrative: Amber May is a 39 y.o. female with a history of morbid obesity (BMI 60), and hypothyroidism who presented 6/24 with abdominal pain and fever found to have SIRS due to severe acute pancreatitis treated with empiric antibiotics, aggressive IV fluids, seen by gastroenterology and general surgery.Further work-up concerning for acute cholecystitis for which percutaneous cholecystostomy drain was placed 6/30 at the same time as 4L paracentesis. With continued supportive care, leukocytosis has improved but not resolved. CT abdomen/pelvis ordered 7/6 showed large left-sided abdominal fluid collection for which IR was reconsulted. They performed exchange of percutaneous cholecystostomy tube and placement of left abdominal drain with aspiration of nearly 1L green bilious fluid with debris on 7/8. Drain output has continued to be significant with thick bilious material.   Assessment & Plan: Principal Problem:   Acute pancreatitis Active Problems:   SIRS (systemic inflammatory response syndrome) (HCC)   AKI (acute kidney injury) (HCC)   Hypothyroidism   Hyperglycemia   Elevated liver enzymes   Acute respiratory failure with hypoxemia (HCC)   Acute cholecystitis   Hypocalcemia   Hypophosphatemia   Morbid obesity with BMI of 50.0-59.9, adult (HCC)   Cholelithiasis   Epigastric abdominal pain   Acute pancreatitis, calculous cholecystitis: LFTs normalized s/p percutaneous cholecystostomy drain 6/30. No choledocholithiasis on MRCP, HIDA with nonvisualization of GB. Cultures negative to date. Presumed to be gallstone pancreatitis without EtOH history or severe hypertriglyceridemia.   - Cefepime continued. Leukocytosis remains, though improving. Abdominal pain remains and has seemingly worsened. No peritoneal signs, so will monitor for another day, encourage ambulation, avoid  dilaudid if at all possible.If abdominal pain worsens or leukocytosis worsens, would need repeat CT abd.  - Surgery planning follow up with bariatric surgeon after discharge for eventual cholecystectomy.  - Cholecystostomy drain care understood by pt's mother who will care for them. - s/p paracentesis and cholecystostomy drain placement on 04/08/2020. 4.8L bilious ascites was drawn off and 69F drain was placed - Diet tolerated.  Intraabdominal fluid collection:  - As above, continuing IV abx. - Drain placed 7/8, care per IR. Planning CT in 10-14 days which they will schedule with patient.  Acute hypoxic respiratory failure, multifactorial: Secondary to acute pancreatitis/cholecystitis, splinting from abdominal pain, abdominal fluid collection and polypharmacy. With some nocturnal desaturations, suspect undiagnosed OSA, probable OHS with atelectasis from prolonged hospitalization.  - Recommend sleep study as outpatient.   Hypocalcemia, hypophosphatemia: Improved.  - Continue monitoring with advancing diet.   AKI: Resolved.   Hyperglycemia: due to stress/infection/acute pancreatitis. Hemoglobin A1c 5.4%.  - No insulin required in past 6 days. Will stop CBG checks.   Ambulatory dysfunction: Improving. Continue PT/OT efforts.   Hepatomegaly and hepatic steatosis: LFTs normalized.   Abnormal low-density in the lower uterine segment/cervix spanning 18 mm, nonspecific. - Recommend nonemergent pelvic ultrasound after discharge.  Morbid obesity with concurrent moderate protein caloric malnutrition. - Body mass index is 57.89 kg/m.  - Supplement protein, dietitian consulted.   Hypothyroidism: Resume home dose synthroid.   DVT prophylaxis: Lovenox Code Status: Full Family Communication: None at bedside  Disposition Plan:  Status is: Inpatient  Remains inpatient appropriate because:Ongoing diagnostic testing needed not appropriate for outpatient work up   Dispo:  Patient  From: Home  Planned Disposition: Home  Expected discharge date: 1-2 days.  Medically stable for discharge: No  Consultants:   IR  Surgery  Procedures:   -  Paracentesis yielding 4.8L bilious ascites.   - Placement of transhepatic 40F perc cholecystostomy tube. - 7/8 Dr. Archer Asa Exchange of perc chole tube and dew drain placed into left abdomen, 85F biliary drain with multiple side holes. Aspiration nearly 1L green billious fluid with debris.   Antimicrobials:  Cefepime >> Zosyn x1 7/8  Subjective: Drain output decreasing, though remains moderately sore throughout abdomen, increasingly so over past 24-48 hours which waxes and wanes, requiring dilaudid for improvement. No vomiting, having stools.  Objective: Vitals:   04/17/20 1236 04/17/20 2133 04/18/20 0520 04/18/20 1107  BP: 107/75 130/64 118/61 111/83  Pulse: 92 94 89 94  Resp: (!) 23 (!) 22 20 18   Temp: 98.6 F (37 C) 98.5 F (36.9 C) 98.1 F (36.7 C) 98.4 F (36.9 C)  TempSrc: Oral Oral Oral Oral  SpO2: 94% 91% 90% 94%  Weight:   (!) 143.6 kg   Height:        Intake/Output Summary (Last 24 hours) at 04/18/2020 1601 Last data filed at 04/18/2020 1103 Gross per 24 hour  Intake --  Output 1505 ml  Net -1505 ml   Filed Weights   04/14/20 0500 04/17/20 0500 04/18/20 0520  Weight: (!) 150 kg (!) 145.5 kg (!) 143.6 kg   Gen: 39 y.o. female in no distress Pulm: Nonlabored breathing room air. Clear. CV: Regular rate and rhythm. No murmur, rub, or gallop. No JVD, no dependent edema. GI: Abdomen soft, obese, dull to percussion, tender throughout with worst locations right of umbilicus, also appropriately tender at drain sites which are without bleeding or exudate or erythema. Distant bowel sounds.  Ext: Warm, no deformities Skin: No new rashes, lesions or ulcers on visualized skin. Neuro: Alert and oriented. No focal neurological deficits. Psych: Judgement and insight appear fair. Mood euthymic & affect  congruent. Behavior is appropriate.    Data Reviewed: I have personally reviewed following labs and imaging studies  CBC: Recent Labs  Lab 04/13/20 0458 04/15/20 0434 04/16/20 0332 04/17/20 0352 04/18/20 0443  WBC 21.9* 18.2* 15.7* 13.3* 12.9*  NEUTROABS  --   --  10.0* 7.5 8.1*  HGB 11.6* 12.6 11.5* 11.1* 11.8*  HCT 38.4 40.2 37.0 36.8 39.3  MCV 84.4 82.2 83.1 84.6 84.3  PLT 327 219 255 221 246   Basic Metabolic Panel: Recent Labs  Lab 04/13/20 0458 04/14/20 1158 04/16/20 0332 04/17/20 0352 04/18/20 0443  NA 136 139 140 140 139  K 4.1 4.2 3.9 4.1 4.4  CL 102 104 104 104 105  CO2 25 26 27 26 24   GLUCOSE 105* 126* 102* 94 97  BUN 10 10 11 9 10   CREATININE 0.59 0.70 0.64 0.62 0.61  CALCIUM 8.2* 8.3* 8.4* 8.5* 8.5*   GFR: Estimated Creatinine Clearance: 127.8 mL/min (by C-G formula based on SCr of 0.61 mg/dL). Liver Function Tests: Recent Labs  Lab 04/13/20 0458 04/14/20 1158 04/16/20 0332 04/17/20 0352 04/18/20 0443  AST 26 29 23 22 21   ALT 24 25 21 19 19   ALKPHOS 81 83 67 63 61  BILITOT 0.6 0.8 0.8 0.8 0.8  PROT 5.0* 5.5* 5.4* 5.2* 5.8*  ALBUMIN 2.1* 2.3* 2.3* 2.3* 2.7*   No results for input(s): LIPASE, AMYLASE in the last 168 hours. No results for input(s): AMMONIA in the last 168 hours. Coagulation Profile: No results for input(s): INR, PROTIME in the last 168 hours. Cardiac Enzymes: No results for input(s): CKTOTAL, CKMB, CKMBINDEX, TROPONINI in the last 168 hours. BNP (last 3 results) No results  for input(s): PROBNP in the last 8760 hours. HbA1C: No results for input(s): HGBA1C in the last 72 hours. CBG: Recent Labs  Lab 04/15/20 1652 04/15/20 2138 04/16/20 0731 04/16/20 1126 04/16/20 1209  GLUCAP 102* 111* 101* 66* 83   Lipid Profile: No results for input(s): CHOL, HDL, LDLCALC, TRIG, CHOLHDL, LDLDIRECT in the last 72 hours. Thyroid Function Tests: No results for input(s): TSH, T4TOTAL, FREET4, T3FREE, THYROIDAB in the last 72  hours. Anemia Panel: No results for input(s): VITAMINB12, FOLATE, FERRITIN, TIBC, IRON, RETICCTPCT in the last 72 hours. Urine analysis: No results found for: COLORURINE, APPEARANCEUR, LABSPEC, PHURINE, GLUCOSEU, HGBUR, BILIRUBINUR, KETONESUR, PROTEINUR, UROBILINOGEN, NITRITE, LEUKOCYTESUR No results found for this or any previous visit (from the past 240 hour(s)).    Radiology Studies: No results found.  Scheduled Meds:  Chlorhexidine Gluconate Cloth  6 each Topical Daily   enoxaparin (LOVENOX) injection  75 mg Subcutaneous Q24H   feeding supplement (PRO-STAT SUGAR FREE 64)  30 mL Oral BID BM   levothyroxine  125 mcg Oral Q0600   mouth rinse  15 mL Mouth Rinse BID   morphine       multivitamins with iron  1 tablet Oral Daily   polyethylene glycol  17 g Oral Daily   Ensure Max Protein  11 oz Oral Daily   sodium chloride flush  10-40 mL Intracatheter Q12H   sodium chloride flush  5 mL Intracatheter Q8H   Continuous Infusions:  sodium chloride Stopped (04/05/20 1702)   ceFEPime (MAXIPIME) IV 2 g (04/18/20 1329)     LOS: 15 days   Time spent: 25 minutes.  Tyrone Nine, MD [facility] [email or web address] 04/18/2020, 4:01 PM

## 2020-04-18 NOTE — Progress Notes (Signed)
Physical Therapy Treatment Patient Details Name: Amber May MRN: [id number] DOB: 24-Dec-1980 Today's Date: 04/18/2020    History of Present Illness 39 year old woman PMH hypothyroidism, morbid obesity presented with abdominal pain and fever and admitted for acute pancreatitis with severe clinical features including fever, acute hypoxic respiratory failure, tachypnea, lactic acidosis, leukocytosis, acute kidney injury, ascites, elevated LFTs.Acute calculus cholecystitis s/p percutaneous cholecystostomy tube placement in IR 04/08/2020. Exchange of percutaneous cholecystostomy tube and placement of left abdominal drain with aspiration of nearly 1L green bilious fluid with debris on 7/8    PT Comments    Pt declines ambulation due to hallway ambulation this day with RN. Pt tolerated EOB LE strengthening exercises well, requiring multiple rest breaks and increased time to perform. Pt with EOB sitting tolerance ~10 minutes. PT to continue to progress mobility as able.      Follow Up Recommendations  Home health PT;Supervision/Assistance - 24 hour;SNF (depending on family decision)     Equipment Recommendations  None recommended by PT    Recommendations for Other Services       Precautions / Restrictions Precautions Precautions: Fall Precaution Comments: R and L side drain    Mobility  Bed Mobility Overal bed mobility: Needs Assistance Bed Mobility: Supine to Sit;Sit to Supine     Supine to sit: Supervision;HOB elevated Sit to supine: Min assist;HOB elevated   General bed mobility comments: supervision for getting to EOB, min assist for return to supine for LE lifting into bed and positioning.  Transfers                 General transfer comment: pt declines, states she just walked  Ambulation/Gait             General Gait Details: pt declines, just walked   Stairs             Wheelchair Mobility    Modified Rankin (Stroke Patients Only)        Balance Overall balance assessment: Needs assistance Sitting-balance support: Feet unsupported Sitting balance-Leahy Scale: Good                                      Cognition Arousal/Alertness: Awake/alert Behavior During Therapy: Flat affect Overall Cognitive Status: Within Functional Limits for tasks assessed                                 General Comments: very flat affect, follows verbal commands well      Exercises General Exercises - Lower Extremity Long Arc Quad: AROM;Both;10 reps;Seated Hip Flexion/Marching: AROM;Both;10 reps;Seated Toe Raises: AROM;Both;10 reps;Seated    General Comments        Pertinent Vitals/Pain Pain Assessment: 0-10 Pain Score: 8  Pain Location: drain sites Pain Descriptors / Indicators: Discomfort;Sore Pain Intervention(s): Limited activity within patient's tolerance;Monitored during session;Repositioned;Patient requesting pain meds-RN notified    Home Living                      Prior Function            PT Goals (current goals can now be found in the care plan section) Acute Rehab PT Goals PT Goal Formulation: With patient Time For Goal Achievement: 04/16/20 Potential to Achieve Goals: Good Progress towards PT goals: Progressing toward goals    Frequency    Min 3X/week  PT Plan Current plan remains appropriate    Co-evaluation              AM-PAC PT "6 Clicks" Mobility   Outcome Measure  Help needed turning from your back to your side while in a flat bed without using bedrails?: A Little Help needed moving from lying on your back to sitting on the side of a flat bed without using bedrails?: A Little Help needed moving to and from a bed to a chair (including a wheelchair)?: A Little Help needed standing up from a chair using your arms (e.g., wheelchair or bedside chair)?: A Little Help needed to walk in hospital room?: A Little Help needed climbing 3-5 steps with a  railing? : A Lot 6 Click Score: 17    End of Session   Activity Tolerance: Patient limited by fatigue;Patient limited by pain Patient left: in bed;with call bell/phone within reach;with bed alarm set;with nursing/sitter in room Nurse Communication: Mobility status;Patient requests pain meds PT Visit Diagnosis: Difficulty in walking, not elsewhere classified (R26.2)     Time: 9937-1696 PT Time Calculation (min) (ACUTE ONLY): 13 min  Charges:  $Therapeutic Exercise: 8-22 mins                    Nieshia Larmon E, PT Acute Rehabilitation Services Pager [phone]  Office [phone]    Marjo Grosvenor D Despina Hidden 04/18/2020, 4:31 PM

## 2020-04-19 DIAGNOSIS — E039 Hypothyroidism, unspecified: Secondary | ICD-10-CM

## 2020-04-19 DIAGNOSIS — R739 Hyperglycemia, unspecified: Secondary | ICD-10-CM

## 2020-04-19 LAB — CBC
HCT: 36.9 % (ref 36.0–46.0)
Hemoglobin: 11.1 g/dL — ABNORMAL LOW (ref 12.0–15.0)
MCH: 25.6 pg — ABNORMAL LOW (ref 26.0–34.0)
MCHC: 30.1 g/dL (ref 30.0–36.0)
MCV: 85 fL (ref 80.0–100.0)
Platelets: 231 10*3/uL (ref 150–400)
RBC: 4.34 MIL/uL (ref 3.87–5.11)
RDW: 15.7 % — ABNORMAL HIGH (ref 11.5–15.5)
WBC: 11 10*3/uL — ABNORMAL HIGH (ref 4.0–10.5)
nRBC: 0.2 % (ref 0.0–0.2)

## 2020-04-19 MED ORDER — OXYCODONE-ACETAMINOPHEN 5-325 MG PO TABS: 1.0000 | ORAL_TABLET | Freq: Three times a day (TID) | ORAL | 0 refills | Status: AC | PRN

## 2020-04-19 MED ORDER — POLYETHYLENE GLYCOL 3350 17 G PO PACK: 17.0000 g | PACK | Freq: Every day | ORAL | 0 refills | Status: AC

## 2020-04-19 MED ORDER — SENNA 8.6 MG PO TABS
1.0000 | ORAL_TABLET | Freq: Every day | ORAL | 0 refills | Status: AC
Start: 2020-04-19 — End: ?

## 2020-04-19 MED ORDER — AMOXICILLIN-POT CLAVULANATE 875-125 MG PO TABS
1.0000 | ORAL_TABLET | Freq: Two times a day (BID) | ORAL | 0 refills | Status: AC
Start: 2020-04-19 — End: 2020-04-24

## 2020-04-19 NOTE — Discharge Summary (Signed)
Physician Discharge Summary  Amber May IZT:[id number] DOB: [birth year] DOA: 04/02/2020  PCP: Patient, No Pcp Per  Admit date: 04/02/2020 Discharge date: 04/19/2020  Admitted From: Home Disposition: Home   Recommendations for Outpatient Follow-up:  1. Follow up with PCP in 1-2 weeks 2. Monitor CBC and CMP 3. IR to contact patient to schedule CT abd/pelvis and drain management 4. General surgery follow up for eventual cholecystectomy.  5. Consider outpatient sleep study.  6. Recommend pelvic ultrasound to investigate abnormal low-density in the lower uterine segment/cervix spanning 18 mm seen incidentally on imaging.  Home Health: PT, OT Equipment/Devices: Drains. No oxygen needs. Discharge Condition: Stable CODE STATUS: Full Diet recommendation: Heart healthy  Brief/Interim Summary: Amber May is a 39 y.o. female with a history of morbid obesity (BMI 60), and hypothyroidism who presented 6/24 with abdominal pain and fever found to have SIRS due to severe acute pancreatitis treated with empiric antibiotics, aggressive IV fluids, seen by gastroenterology and general surgery.Further work-up concerning for acute cholecystitis for which percutaneous cholecystostomy drain was placed 6/30 at the same time as 4L paracentesis. With continued supportive care, leukocytosis has improved and she's remained afebrile. CT abdomen/pelvis ordered 7/6 showed large left-sided abdominal fluid collection for which IR was reconsulted. They performed exchange of percutaneous cholecystostomy tube and placement of left abdominal drain with aspiration of nearly 1L green bilious fluid with debris on 7/8. Drain output has decreased, abdominal pain improved, and SIRS resolved. She is discharged in stable condition after sufficient training on drain management for her mother, and will follow up with CT scan in 10-14 days per IR and with general surgery for eventual cholecystectomy.   Discharge Diagnoses:   Principal Problem:   Acute pancreatitis Active Problems:   SIRS (systemic inflammatory response syndrome) (HCC)   AKI (acute kidney injury) (HCC)   Hypothyroidism   Hyperglycemia   Elevated liver enzymes   Acute respiratory failure with hypoxemia (HCC)   Acute cholecystitis   Hypocalcemia   Hypophosphatemia   Morbid obesity with BMI of 50.0-59.9, adult (HCC)   Cholelithiasis   Epigastric abdominal pain  Acute pancreatitis, calculous cholecystitis: LFTs normalized s/p percutaneous cholecystostomy drain 6/30. No choledocholithiasis on MRCP, HIDA with nonvisualization of GB. Cultures negative to date. Presumed to be gallstone pancreatitis without EtOH history or severe hypertriglyceridemia.   - Cefepime transition to augmentin at discharge. Leukocytosis continues improvement with reassuring flat fever curve. Drain output stabilizing without purulence.  - Surgery planning follow up with bariatric surgeon after discharge for eventual cholecystectomy.  - Cholecystostomy drain care understood by pt's mother who will care for them. - s/p paracentesis and cholecystostomy drain placement on 04/08/2020. 4.8L bilious ascites was drawn off and 53F drain was placed - Diet tolerated.  Intraabdominal fluid collection:  - As above, continuing antibiotics - Drain placed 7/8, care per IR. Planning CT in 10-14 days which they will schedule with patient.  Acute hypoxic respiratory failure, multifactorial: Secondary to acute pancreatitis/cholecystitis, splinting from abdominal pain, abdominal fluid collection and polypharmacy. With some nocturnal desaturations, suspect undiagnosed OSA, probable OHS with atelectasis from prolonged hospitalization. This has resolved at discharge, though outpatient sleep study is recommended.   Hypocalcemia, hypophosphatemia: Improved.   AKI: Resolved.   Hyperglycemia: due to stress/infection/acute pancreatitis. Hemoglobin A1c 5.4%.   Ambulatory dysfunction:  Improving. Continue PT/OT efforts.   Hepatomegaly and hepatic steatosis: LFTs normalized.   Abnormal low-density in the lower uterine segment/cervix spanning 18 mm, nonspecific. -Recommend nonemergent pelvic ultrasound after discharge.  Morbid obesitywith concurrent  moderate protein caloric malnutrition. - Body mass index is 57.89 kg/m.  - Supplement protein, dietitian consulted.   Hypothyroidism: Resume home dose synthroid.   Discharge Instructions Discharge Instructions    Change dressing (specify)   Complete by: As directed    Dressing changes as needed when soiled or wet, approximately every 3 days   Discharge instructions   Complete by: As directed    Patient will discharge home with drain in place.  Flush drain daily with 5-10 mL sterile saline. Dressing changes every 3 days or as needed if soiled/wet and record output once daily. Appreciate RN providing education on flushing/drain care. She will see Interventional Radiology in clinic in approximately 10 days for follow up CT/possible drain injection.  Schedulers will contact you with date and time of appointment.  Continue taking augmentin for 5 more days You may take percocet for severe pain for the next five days, but should need less and less of this with time. Every day you take this, take senna and miralax to minimize constipation. If your abdominal pain worsens, you develop fever, or foul smelling discharge, seek medical attention sooner. Otherwise, follow up with interventional radiology and surgery as planned.     Allergies as of 04/19/2020   No Known Allergies     Medication List    TAKE these medications   amoxicillin-clavulanate 875-125 MG tablet Commonly known as: Augmentin Take 1 tablet by mouth 2 (two) times daily for 5 days.   levothyroxine 125 MCG tablet Commonly known as: SYNTHROID Take 125 mcg by mouth daily before breakfast.   oxyCODONE-acetaminophen 5-325 MG tablet Commonly known as:  Percocet Take 1 tablet by mouth every 8 (eight) hours as needed for up to 5 days for severe pain.   polyethylene glycol 17 g packet Commonly known as: MIRALAX / GLYCOLAX Take 17 g by mouth daily. Start taking on: April 20, 2020   senna 8.6 MG Tabs tablet Commonly known as: SENOKOT Take 1 tablet (8.6 mg total) by mouth daily.            Discharge Care Instructions  (From admission, onward)         Start     Ordered   04/17/20 0000  Change dressing (specify)       Comments: Dressing changes as needed when soiled or wet, approximately every 3 days   04/17/20 1645          Follow-up Information    Malachy Moan, MD Follow up.   Specialties: Interventional Radiology, Radiology Why: Schedulers will contact you with date and time of appointment. Contact information: [address] [place] [address] [phone]        Luretha Murphy, MD Follow up.   Specialty: General Surgery Why: Follow up to discuss interval cholecystectomy  Contact information: [address] [place] [address] [phone]              No Known Allergies  Consultations:  IR  Surgery  Procedures/Studies: CT ABDOMEN PELVIS WO CONTRAST  Result Date: 04/14/2020 CLINICAL DATA:  39 year old female with cholecystitis status post percutaneous cholecystostomy. Continued leukocytosis. EXAM: CT ABDOMEN AND PELVIS WITHOUT CONTRAST TECHNIQUE: Multidetector CT imaging of the abdomen and pelvis was performed following the standard protocol without IV contrast. COMPARISON:  CT abdomen pelvis dated 04/02/2020. FINDINGS: Evaluation of this exam is limited in the absence of intravenous contrast. Lower chest: There are trace bilateral pleural effusions with bibasilar patchy densities which may represent atelectasis  or infiltrate. Clinical correlation is recommended. There is mild eventration of the left hemidiaphragm. Coronary vascular calcifications noted. No intra-abdominal  free air. There is a large fluid collection lateral to the spleen extending inferiorly to the anterior pelvis. This fluid measures approximately 11 x 13 cm in axial dimensions and 34 cm in craniocaudal length. There is associated mass effect on the peritoneal structures. In addition there is a smaller fluid in the right abdomen extending from the subhepatic area inferiorly. Hepatobiliary: The liver is unremarkable. No intrahepatic biliary ductal dilatation. The gallbladder is contracted. A percutaneous cholecystostomy is noted with pigtail tip in the region of the gallbladder fundus. No calcified gallstone. Pancreas: Unremarkable. No pancreatic ductal dilatation or surrounding inflammatory changes. Spleen: The spleen is mildly displaced medially secondary to mass effect caused by adjacent fluid. The spleen is otherwise unremarkable. Adrenals/Urinary Tract: The adrenal glands unremarkable. There is no hydronephrosis or nephrolithiasis on either side. The visualized ureters and urinary bladder appear unremarkable. Stomach/Bowel: There is no bowel obstruction or active inflammation. There is displacement of the bowel by the abdominal fluid collection. Vascular/Lymphatic: The abdominal aorta and IVC are grossly unremarkable. No portal venous gas. There is no adenopathy. Reproductive: The uterus is anteverted and grossly unremarkable. No adnexal masses. Other: There is subcutaneous edema. Musculoskeletal: Mild degenerative changes of the spine. No acute osseous pathology. IMPRESSION: 1. Large fluid collection lateral to the spleen extending inferiorly to the anterior pelvis with associated mass effect on the peritoneal structures. 2. Percutaneous cholecystostomy with pigtail tip in the region of the gallbladder fundus. 3. No bowel obstruction. 4. Trace bilateral pleural effusions with bibasilar patchy densities which may represent atelectasis or infiltrate. Clinical correlation is recommended. 5. Aortic Atherosclerosis  (ICD10-I70.0). Electronically Signed   By: Elgie Collard M.D.   On: 04/14/2020 19:56   CT ABDOMEN PELVIS WO CONTRAST  Result Date: 04/02/2020 CLINICAL DATA:  Abdominal distension. EXAM: CT ABDOMEN AND PELVIS WITHOUT CONTRAST TECHNIQUE: Multidetector CT imaging of the abdomen and pelvis was performed following the standard protocol without IV contrast. COMPARISON:  None. FINDINGS: Lower chest: Motion artifact through the lung bases. Elevation of right hemidiaphragm with compressive atelectasis. Possible trace right pleural effusion. Hepatobiliary: Enlarged liver spanning 20 cm cranial caudal with mild steatosis. No evidence of discrete focal lesion. Motion limits assessment for capsular nodularity. Layering hyperdensity in the gallbladder. Ascites adjacent to the gallbladder which is similar to the ascites adjacent to the liver. Common bile duct is not well-defined, there is no obvious biliary dilatation. Pancreas: Peripancreatic fat stranding and edema adjacent to the head and uncinate process. Small amount of non organized peripancreatic fluid. No ductal dilatation. No evidence of acute peripancreatic collection. Cannot assess for necrosis in the absence of IV contrast. Spleen: Normal in size without focal abnormality. Adrenals/Urinary Tract: Normal adrenal glands. No renal stones or hydronephrosis. Both ureters are decompressed without stones along the course. Urinary bladder is nondistended. Stomach/Bowel: Fluid/ingested material in the stomach. Mild duodenal wall thickening in the fourth portion adjacent to the pancreatic inflammation. There is no small bowel obstruction or inflammatory change. Detailed bowel assessment is limited in the absence of contrast and presence of abdominal ascites. The majority of the colon is decompressed. Vascular/Lymphatic: Normal caliber abdominal aorta. Few prominent periportal lymph nodes are likely reactive. Reproductive: Abnormal low-density in the lower uterine  segment/cervix spanning 18 mm, nonspecific. No evidence of adnexal mass. Other: Small to moderate volume of abdominopelvic ascites. There is free fluid in both upper quadrants, both pericolic gutters, mesentery, and  tracking into the pelvis. No free air or organized fluid collection. Small fat containing umbilical hernia. Musculoskeletal: Degenerative change in the spine. There are no acute or suspicious osseous abnormalities. IMPRESSION: 1. Acute edematous pancreatitis. No evidence of acute peripancreatic collection. 2. Hepatomegaly and hepatic steatosis. Layering hyperdensity in the gallbladder may represent sludge or layering stones. 3. Small to moderate volume of abdominopelvic ascites. This may be in part related to pancreatitis, underlying liver disease, or combination thereof. 4. Abnormal low-density in the lower uterine segment/cervix spanning 18 mm, nonspecific. Recommend nonemergent pelvic ultrasound for characterization on an elective outpatient basis. Electronically Signed   By: Narda Rutherford M.D.   On: 04/02/2020 20:06   NM Hepatobiliary Liver Func  Result Date: 04/06/2020 CLINICAL DATA:  Abdominal pain. EXAM: NUCLEAR MEDICINE HEPATOBILIARY IMAGING TECHNIQUE: Sequential images of the abdomen were obtained out to 60 minutes following intravenous administration of radiopharmaceutical. RADIOPHARMACEUTICALS:  5.37 mCi Tc-27m  Choletec IV COMPARISON:  April 03, 2020.  April 02, 2020. FINDINGS: Prompt uptake and biliary excretion of activity by the liver is seen. However, there is no definite evidence of uptake within the gallbladder, even after the administration of 3 mg of morphine intravenously. Biliary activity passes into small bowel, consistent with patent common bile duct. IMPRESSION: No definite evidence of uptake is noted within the gallbladder lumen, even after the administration of morphine intravenously. This is concerning for possible cystic duct obstruction and acute cholecystitis.  Electronically Signed   By: Lupita Raider M.D.   On: 04/06/2020 12:12   MR ABDOMEN MRCP WO CONTRAST  Result Date: 04/03/2020 CLINICAL DATA:  Cholelithiasis. Known pancreatitis. Patient had difficulty breathing and was unable to complete the last series of the exam. EXAM: MRI ABDOMEN WITHOUT CONTRAST  (INCLUDING MRCP) TECHNIQUE: Multiplanar multisequence MR imaging of the abdomen was performed. Heavily T2-weighted images of the biliary and pancreatic ducts were obtained, and three-dimensional MRCP images were rendered by post processing. COMPARISON:  CT scan 04/02/2020 FINDINGS: Lower chest: Bilateral pleural effusions associated with bibasilar collapse/consolidative changes. Hepatobiliary: Liver measures 20.8 cm craniocaudal length, enlarged. Loss of signal in the liver parenchyma on out of phase T1 imaging suggests fatty deposition. No focal abnormality in the liver parenchyma on this noncontrast motion degraded study. Numerous tiny gallstones evident with apparent gallbladder wall thickening. No intrahepatic biliary duct dilatation. No extrahepatic biliary duct dilatation. Study is markedly limited, but no definite choledocholithiasis. Axial T2 haste imaging demonstrates some central flow artifact in the common duct, but no definite ductal stones. Pancreas: No main duct dilatation. No gross mass lesion in the pancreas. Mild edema seen in the region of the pancreatic head. Spleen:  No splenomegaly. No focal mass lesion. Adrenals/Urinary Tract: No adrenal nodule or mass. No hydronephrosis. No gross renal mass on this noncontrast study. Probable 11 mm cyst interpolar left kidney. Stomach/Bowel: Stomach is unremarkable. No gastric wall thickening. No evidence of outlet obstruction. Duodenum is normally positioned as is the ligament of Treitz. No small bowel or colonic dilatation within the visualized abdomen. Vascular/Lymphatic: No abdominal aortic aneurysm. There is no gastrohepatic or hepatoduodenal ligament  lymphadenopathy. No retroperitoneal or mesenteric lymphadenopathy. Other:  Moderate volume intraperitoneal free fluid. Musculoskeletal: No suspicious marrow signal abnormality within the visualized bony anatomy. IMPRESSION: 1. Limited study due to motion artifact and lack of intravenous contrast. Patient was unable to complete the entire exam. 2. Cholelithiasis with apparent gallbladder wall thickening. No intrahepatic or extrahepatic biliary duct dilatation. No definite choledocholithiasis. If there is clinical concern for acute cholecystitis,  abdominal ultrasound or nuclear scintigraphy may prove helpful to further evaluate. 3. Mild edema in the region of the pancreatic head. No main duct dilatation. Imaging features may be related to pancreatitis. 4. Hepatomegaly with steatosis. 5. Moderate volume intraperitoneal free fluid. 6. Bilateral pleural effusions associated with bibasilar collapse/consolidative changes. Electronically Signed   By: Kennith Center M.D.   On: 04/03/2020 12:02   MR 3D Recon At Scanner  Result Date: 04/03/2020 CLINICAL DATA:  Cholelithiasis. Known pancreatitis. Patient had difficulty breathing and was unable to complete the last series of the exam. EXAM: MRI ABDOMEN WITHOUT CONTRAST  (INCLUDING MRCP) TECHNIQUE: Multiplanar multisequence MR imaging of the abdomen was performed. Heavily T2-weighted images of the biliary and pancreatic ducts were obtained, and three-dimensional MRCP images were rendered by post processing. COMPARISON:  CT scan 04/02/2020 FINDINGS: Lower chest: Bilateral pleural effusions associated with bibasilar collapse/consolidative changes. Hepatobiliary: Liver measures 20.8 cm craniocaudal length, enlarged. Loss of signal in the liver parenchyma on out of phase T1 imaging suggests fatty deposition. No focal abnormality in the liver parenchyma on this noncontrast motion degraded study. Numerous tiny gallstones evident with apparent gallbladder wall thickening. No  intrahepatic biliary duct dilatation. No extrahepatic biliary duct dilatation. Study is markedly limited, but no definite choledocholithiasis. Axial T2 haste imaging demonstrates some central flow artifact in the common duct, but no definite ductal stones. Pancreas: No main duct dilatation. No gross mass lesion in the pancreas. Mild edema seen in the region of the pancreatic head. Spleen:  No splenomegaly. No focal mass lesion. Adrenals/Urinary Tract: No adrenal nodule or mass. No hydronephrosis. No gross renal mass on this noncontrast study. Probable 11 mm cyst interpolar left kidney. Stomach/Bowel: Stomach is unremarkable. No gastric wall thickening. No evidence of outlet obstruction. Duodenum is normally positioned as is the ligament of Treitz. No small bowel or colonic dilatation within the visualized abdomen. Vascular/Lymphatic: No abdominal aortic aneurysm. There is no gastrohepatic or hepatoduodenal ligament lymphadenopathy. No retroperitoneal or mesenteric lymphadenopathy. Other:  Moderate volume intraperitoneal free fluid. Musculoskeletal: No suspicious marrow signal abnormality within the visualized bony anatomy. IMPRESSION: 1. Limited study due to motion artifact and lack of intravenous contrast. Patient was unable to complete the entire exam. 2. Cholelithiasis with apparent gallbladder wall thickening. No intrahepatic or extrahepatic biliary duct dilatation. No definite choledocholithiasis. If there is clinical concern for acute cholecystitis, abdominal ultrasound or nuclear scintigraphy may prove helpful to further evaluate. 3. Mild edema in the region of the pancreatic head. No main duct dilatation. Imaging features may be related to pancreatitis. 4. Hepatomegaly with steatosis. 5. Moderate volume intraperitoneal free fluid. 6. Bilateral pleural effusions associated with bibasilar collapse/consolidative changes. Electronically Signed   By: Kennith Center M.D.   On: 04/03/2020 12:02   IR Perc  Cholecystostomy  Result Date: 04/08/2020 INDICATION: 39 year old morbidly obese female with acute gallstone pancreatitis an additional clinical concerns for acute calculus cholecystitis given nonvisualization of gallbladder on HIDA scan. Of note, the gallbladder is not distended but is contracted and completely filled with stones. She is not currently a surgical candidate and presents for attempted percutaneous cholecystostomy tube placement. EXAM: IR PARACENTESIS; CHOLECYSTOSTOMY MEDICATIONS: In patient currently receiving intravenous meropenem. No additional antibiotic prophylaxis was administered. ANESTHESIA/SEDATION: Moderate (conscious) sedation was employed during this procedure. A total of Versed 8 mg and Fentanyl 200 mcg was administered intravenously. Moderate Sedation Time: 54 minutes. The patient's level of consciousness and vital signs were monitored continuously by radiology nursing throughout the procedure under my direct supervision. FLUOROSCOPY TIME:  Fluoroscopy Time: 2 minutes 42 seconds (215 mGy). COMPLICATIONS: None immediate. PROCEDURE: Informed written consent was obtained from the patient after a thorough discussion of the procedural risks, benefits and alternatives. All questions were addressed. Maximal Sterile Barrier Technique was utilized including caps, mask, sterile gowns, sterile gloves, sterile drape, hand hygiene and skin antiseptic. A timeout was performed prior to the initiation of the procedure. The right upper quadrant was interrogated with ultrasound. There has been a significant increase in ascites compared to prior CT imaging. The liver is floating in ascites. Proceeding with placement of a percutaneous cholecystostomy tube in this setting is not ideal given the increased risk for bleeding and bile leak. Therefore, the decision was made to proceed with paracentesis to drain the fluid around the liver. 1% lidocaine with epinephrine was used for local anesthesia. Following  this, a 6 Fr Safe-T-Centesis catheter was introduced. An ultrasound image was saved for documentation purposes. The paracentesis was performed. The catheter was removed and a dressing was applied. The patient tolerated the procedure well without immediate post procedural complication. Approximately 4800 mL of bilious ascites was successfully aspirated. Repeat ultrasound imaging demonstrates that the liver is again opposed to the abdominal wall. Therefore, a second skin entry site was selected. Local anesthesia was again attained by infiltration with 1% lidocaine. Under real-time ultrasound guidance, a 21 gauge Accustick needle was advanced along a short transhepatic course and into the stone filled gallbladder. A gentle hand injection of contrast material performed through the needle opacifies the stone filled gallbladder. There are innumerous filling defects throughout the gallbladder lumen. A 0.018 wire was successfully coiled in the gallbladder fundus. The needle was exchanged for the Accustick transitional sheath which was advanced over the wire and into the gallbladder lumen. The 0.018 wire was then exchanged for a 0.035 Amplatz wire. The skin and transhepatic tract were dilated to 10 [place] and a Cook 10.2 [place] all-purpose drainage catheter was advanced over the wire and formed in the gallbladder fundus. Gentle contrast injection confirms that the tube is within the gallbladder. No evidence of filling of the cystic duct. The catheter was connected to gravity bag drainage and secured to the skin with 0 Prolene suture. IMPRESSION: 1. Significant interval increase in ascites compared to prior CT and MRI imaging. Paracentesis was required prior to cholecystostomy tube places mint. 2. Successful paracentesis yielding 4.8 L of bilious appearing ascites. 3. Successful placement of transhepatic percutaneous cholecystostomy tube for a diagnosis of acute calculus cholecystitis and gallstone pancreatitis. Signed, Sterling BigHeath  K. McCullough, MD, RPVI [facility] Electronically Signed   By: Malachy MoanHeath  McCullough M.D.   On: 04/08/2020 16:13   DG CHEST PORT 1 VIEW  Result Date: 04/07/2020 CLINICAL DATA:  Shortness of breath, acute pancreatitis EXAM: PORTABLE CHEST 1 VIEW COMPARISON:  Portable exam 1528 hours compared to 04/06/2020 FINDINGS: Rotated to the LEFT. Normal heart size and mediastinal contours. Significantly decreased lung volumes with bibasilar atelectasis versus infiltrate. Upper lungs clear. No pleural effusion or pneumothorax. Bones unremarkable. IMPRESSION: Significantly decreased lung volumes with bibasilar atelectasis versus infiltrate. Electronically Signed   By: Ulyses SouthwardMark  Boles M.D.   On: 04/07/2020 15:54   DG CHEST PORT 1 VIEW  Result Date: 04/06/2020 CLINICAL DATA:  Shortness of breath, tachycardia EXAM: PORTABLE CHEST 1 VIEW COMPARISON:  04/04/2020 FINDINGS: Cardiomegaly. Low lung volumes with vascular congestion and bibasilar atelectasis. Suspect small layering effusions. No acute bony abnormality. IMPRESSION: Small bilateral effusions with bibasilar opacities, likely atelectasis. Cardiomegaly, vascular congestion.  Electronically Signed   By: Charlett Nose M.D.   On: 04/06/2020 23:41   DG CHEST PORT 1 VIEW  Result Date: 04/04/2020 CLINICAL DATA:  Acute respiratory failure EXAM: PORTABLE CHEST 1 VIEW COMPARISON:  April 03, 2020 FINDINGS: Bilateral pleural effusions with underlying opacities. No other changes. IMPRESSION: Stable bilateral effusions with underlying opacities, favored represent atelectasis. No other change. Recommend follow-up to resolution. Electronically Signed   By: Gerome Sam III M.D   On: 04/04/2020 08:37   DG CHEST PORT 1 VIEW  Result Date: 04/03/2020 CLINICAL DATA:  Hypoxia, shortness of breath EXAM: PORTABLE CHEST 1 VIEW COMPARISON:  04/02/2020 FINDINGS: Examination is significantly limited secondary to poor penetration from  patient body habitus, semiupright portable technique, and poor inspiratory effort. Very low lung volumes with accentuation of the heart size. Hazy and streaky bibasilar opacities suggesting small bilateral pleural effusions with bibasilar atelectasis. No pneumothorax. IMPRESSION: Limited examination. Hazy and streaky bibasilar opacities suggesting small bilateral pleural effusions with bibasilar atelectasis. Electronically Signed   By: Duanne Guess D.O.   On: 04/03/2020 13:59   DG Chest Port 1 View  Result Date: 04/02/2020 CLINICAL DATA:  Sepsis EXAM: PORTABLE CHEST 1 VIEW COMPARISON:  None. FINDINGS: Low lung volume with hypoventilatory change. Atelectasis at the right base. Borderline cardiomegaly augmented by low lung volume. No pneumothorax. IMPRESSION: Low lung volume with atelectasis at the right base. Electronically Signed   By: Jasmine Pang M.D.   On: 04/02/2020 20:08   IR EXCHANGE BILIARY DRAIN  Result Date: 04/16/2020 INDICATION: 39 year old female with acute calculus cholecystitis. A percutaneous cholecystostomy tube was placed on 04/08/2020. Her gallbladder is densely packed with stones and the tube could just be formed within the gallbladder fundus. Recent CT imaging demonstrates that the tube is slowly migrating out of the gallbladder. She presents for tube repositioning and exchange. EXAM: Cholecystostomy tube exchange MEDICATIONS: 3.375 g Zosyn; The antibiotic was administered within an appropriate time frame prior to the initiation of the procedure. ANESTHESIA/SEDATION: Moderate (conscious) sedation was employed during this procedure. A total of Versed 2 mg and Fentanyl 100 mcg was administered intravenously. Moderate Sedation Time: 27 minutes. The patient's level of consciousness and vital signs were monitored continuously by radiology nursing throughout the procedure under my direct supervision. FLUOROSCOPY TIME:  Fluoroscopy Time: 0 minutes 48 seconds (7 mGy). COMPLICATIONS: None  immediate. PROCEDURE: Informed written consent was obtained from the patient after a thorough discussion of the procedural risks, benefits and alternatives. All questions were addressed. Maximal Sterile Barrier Technique was utilized including caps, mask, sterile gowns, sterile gloves, sterile drape, hand hygiene and skin antiseptic. A timeout was performed prior to the initiation of the procedure. I hand injection of contrast material through the existing tube demonstrates that the tube remains within the gallbladder lumen. The gallbladder remains packed with stones. The tube was cut and removed over an Amplatz wire. The wire was manipulated in till it extended toward the neck of the gallbladder. A new Cook 10.2 [place] all-purpose drainage catheter was then advanced over the wire and formed in the region of the gallbladder neck. The tube was secured to the skin with 0 Prolene suture. An image was obtained and stored for the medical record. The patient tolerated the procedure well. IMPRESSION: Successful exchange and reposition for a new 10.2 French percutaneous transhepatic cholecystostomy tube. The tube is now position deeper within the gallbladder lumen nearly at the gallbladder neck. Electronically Signed   By: Malachy Moan M.D.   On: 04/16/2020  15:04   IR IMAGE GUIDED DRAINAGE BY PERCUTANEOUS CATHETER  Result Date: 04/16/2020 INDICATION: 39 year old female with acute calculus cholecystitis and gallstone pancreatitis complicated by intra-abdominal fluid collections. Recent paracentesis performed 04/08/2020 yielded several L of bilious ascites. She now has a large loculated fluid collection in the left hemiabdomen and presents for drain placement. EXAM: Drain placement utilizing ultrasound guidance MEDICATIONS: The patient is currently admitted to the hospital and receiving intravenous antibiotics. The antibiotics were administered within an appropriate time frame prior to the initiation of the procedure.  ANESTHESIA/SEDATION: None COMPLICATIONS: None immediate. PROCEDURE: Informed written consent was obtained from the patient after a thorough discussion of the procedural risks, benefits and alternatives. All questions were addressed. Maximal Sterile Barrier Technique was utilized including caps, mask, sterile gowns, sterile gloves, sterile drape, hand hygiene and skin antiseptic. A timeout was performed prior to the initiation of the procedure. The left upper quadrant was interrogated with ultrasound. A large complex fluid collection is identified. Local anesthesia was attained by infiltration with 1% lidocaine. A small dermatotomy was made. Under real-time ultrasound guidance, the fluid collection was accessed with a 15 cm 18 gauge trocar needle. A wire was advanced into the fluid collection. The skin tract was then dilated to 12 [place]. A Cook 12 [place] biliary drainage catheter was then advanced over the wire and formed. Aspiration yields nearly 1 L of dark green bilious fluid containing debris. The catheter was secured to the skin with 0 Prolene suture. IMPRESSION: Successful placement of a 35 French biliary drainage catheter into the large left abdominal fluid collection. Aspiration yields nearly 1 L of dark green bilious fluid and debris. Electronically Signed   By: Malachy Moan M.D.   On: 04/16/2020 15:06   US Abdomen Limited RUQ  Result Date: 04/03/2020 CLINICAL DATA:  Pancreatitis. EXAM: ULTRASOUND ABDOMEN LIMITED RIGHT UPPER QUADRANT COMPARISON:  MRCP 04/03/2020 FINDINGS: The examination is limited due to patient body habitus. Gallbladder: Cholecystolithiasis. Thickened appearance of the gallbladder wall measuring 5 mm. No sonographic Eulah Pont sign is elicited by the scanning technologist. Common bile duct: Diameter: The common duct is dilated measuring 7 mm in diameter. Liver: Generalized increased hepatic parenchymal echogenicity. No definite focal liver lesion is identified. Portal vein is patent  on color Doppler imaging with normal direction of blood flow towards the liver. Other: Moderate ascites within the visualized abdomen. IMPRESSION: Limited examination as described. There is cholecystolithiasis. Additionally, there is a thickened appearance of the gallbladder wall measuring 5 mm. Acute cholecystitis cannot be excluded, although gallbladder wall thickening is a nonspecific finding in the setting of abdominal ascites, and a nuclear medicine HIDA scan may be helpful for further assessment. Mild extrahepatic biliary ductal dilatation with the common duct measuring 7 mm in diameter. Hyperechogenicity of the hepatic parenchyma. Findings are consistent with the hepatic steatosis demonstrated on same day MRCP. Moderate ascites within the visualized abdomen. Electronically Signed   By: Jackey Loge DO   On: 04/03/2020 17:05   IR Paracentesis  Result Date: 04/08/2020 INDICATION: 39 year old morbidly obese female with acute gallstone pancreatitis an additional clinical concerns for acute calculus cholecystitis given nonvisualization of gallbladder on HIDA scan. Of note, the gallbladder is not distended but is contracted and completely filled with stones. She is not currently a surgical candidate and presents for attempted percutaneous cholecystostomy tube placement. EXAM: IR PARACENTESIS; CHOLECYSTOSTOMY MEDICATIONS: In patient currently receiving intravenous meropenem. No additional antibiotic prophylaxis was administered. ANESTHESIA/SEDATION: Moderate (conscious) sedation was employed during this procedure. A total of Versed 8  mg and Fentanyl 200 mcg was administered intravenously. Moderate Sedation Time: 54 minutes. The patient's level of consciousness and vital signs were monitored continuously by radiology nursing throughout the procedure under my direct supervision. FLUOROSCOPY TIME:  Fluoroscopy Time: 2 minutes 42 seconds (215 mGy). COMPLICATIONS: None immediate. PROCEDURE: Informed written consent  was obtained from the patient after a thorough discussion of the procedural risks, benefits and alternatives. All questions were addressed. Maximal Sterile Barrier Technique was utilized including caps, mask, sterile gowns, sterile gloves, sterile drape, hand hygiene and skin antiseptic. A timeout was performed prior to the initiation of the procedure. The right upper quadrant was interrogated with ultrasound. There has been a significant increase in ascites compared to prior CT imaging. The liver is floating in ascites. Proceeding with placement of a percutaneous cholecystostomy tube in this setting is not ideal given the increased risk for bleeding and bile leak. Therefore, the decision was made to proceed with paracentesis to drain the fluid around the liver. 1% lidocaine with epinephrine was used for local anesthesia. Following this, a 6 Fr Safe-T-Centesis catheter was introduced. An ultrasound image was saved for documentation purposes. The paracentesis was performed. The catheter was removed and a dressing was applied. The patient tolerated the procedure well without immediate post procedural complication. Approximately 4800 mL of bilious ascites was successfully aspirated. Repeat ultrasound imaging demonstrates that the liver is again opposed to the abdominal wall. Therefore, a second skin entry site was selected. Local anesthesia was again attained by infiltration with 1% lidocaine. Under real-time ultrasound guidance, a 21 gauge Accustick needle was advanced along a short transhepatic course and into the stone filled gallbladder. A gentle hand injection of contrast material performed through the needle opacifies the stone filled gallbladder. There are innumerous filling defects throughout the gallbladder lumen. A 0.018 wire was successfully coiled in the gallbladder fundus. The needle was exchanged for the Accustick transitional sheath which was advanced over the wire and into the gallbladder lumen. The  0.018 wire was then exchanged for a 0.035 Amplatz wire. The skin and transhepatic tract were dilated to 10 [place] and a Cook 10.2 [place] all-purpose drainage catheter was advanced over the wire and formed in the gallbladder fundus. Gentle contrast injection confirms that the tube is within the gallbladder. No evidence of filling of the cystic duct. The catheter was connected to gravity bag drainage and secured to the skin with 0 Prolene suture. IMPRESSION: 1. Significant interval increase in ascites compared to prior CT and MRI imaging. Paracentesis was required prior to cholecystostomy tube places mint. 2. Successful paracentesis yielding 4.8 L of bilious appearing ascites. 3. Successful placement of transhepatic percutaneous cholecystostomy tube for a diagnosis of acute calculus cholecystitis and gallstone pancreatitis. Signed, Sterling Big, MD, RPVI [facility] Electronically Signed   By: Malachy Moan M.D.   On: 04/08/2020 16:13     - Paracentesis yielding 4.8L bilious ascites.  - Placement of transhepatic 29F perc cholecystostomy tube. - 7/8 Dr. Archer Asa Exchange of perc chole tube and dew drain placed into left abdomen, 66F biliary drain with multiple side holes. Aspiration nearly 1L green billious fluid with debris.   Subjective: Feels well, abdominal pain improved, no fever, getting around ok. Mother at bedside comfortable with drain care.   Discharge Exam: Vitals:   04/18/20 2123 04/19/20 0520  BP: 138/65 (!) 123/54  Pulse: 92 84  Resp: 14 20  Temp: 98.4 F (36.9 C) 98.4 F (36.9 C)  SpO2: 91%  90%   General: Pt is alert, awake, not in acute distress Cardiovascular: RRR, S1/S2 +, no rubs, no gallops Respiratory: CTA bilaterally, no wheezing, no rhonchi Abdominal: Soft, NT, ND, bowel sounds + Skin: Drain sites c/d/i with bilious thin output. Extremities: No edema, no cyanosis  Labs: BNP (last 3  results) Recent Labs    04/07/20 0554  BNP 141.6*   Basic Metabolic Panel: Recent Labs  Lab 04/13/20 0458 04/14/20 1158 04/16/20 0332 04/17/20 0352 04/18/20 0443  NA 136 139 140 140 139  K 4.1 4.2 3.9 4.1 4.4  CL 102 104 104 104 105  CO2 25 26 27 26 24   GLUCOSE 105* 126* 102* 94 97  BUN 10 10 11 9 10   CREATININE 0.59 0.70 0.64 0.62 0.61  CALCIUM 8.2* 8.3* 8.4* 8.5* 8.5*   Liver Function Tests: Recent Labs  Lab 04/13/20 0458 04/14/20 1158 04/16/20 0332 04/17/20 0352 04/18/20 0443  AST 26 29 23 22 21   ALT 24 25 21 19 19   ALKPHOS 81 83 67 63 61  BILITOT 0.6 0.8 0.8 0.8 0.8  PROT 5.0* 5.5* 5.4* 5.2* 5.8*  ALBUMIN 2.1* 2.3* 2.3* 2.3* 2.7*   No results for input(s): LIPASE, AMYLASE in the last 168 hours. No results for input(s): AMMONIA in the last 168 hours. CBC: Recent Labs  Lab 04/15/20 0434 04/16/20 0332 04/17/20 0352 04/18/20 0443 04/19/20 0351  WBC 18.2* 15.7* 13.3* 12.9* 11.0*  NEUTROABS  --  10.0* 7.5 8.1*  --   HGB 12.6 11.5* 11.1* 11.8* 11.1*  HCT 40.2 37.0 36.8 39.3 36.9  MCV 82.2 83.1 84.6 84.3 85.0  PLT 219 255 221 246 231   Cardiac Enzymes: No results for input(s): CKTOTAL, CKMB, CKMBINDEX, TROPONINI in the last 168 hours. BNP: Invalid input(s): POCBNP CBG: Recent Labs  Lab 04/15/20 1652 04/15/20 2138 04/16/20 0731 04/16/20 1126 04/16/20 1209  GLUCAP 102* 111* 101* 66* 83   D-Dimer No results for input(s): DDIMER in the last 72 hours. Hgb A1c No results for input(s): HGBA1C in the last 72 hours. Lipid Profile No results for input(s): CHOL, HDL, LDLCALC, TRIG, CHOLHDL, LDLDIRECT in the last 72 hours. Thyroid function studies No results for input(s): TSH, T4TOTAL, T3FREE, THYROIDAB in the last 72 hours.  Invalid input(s): FREET3 Anemia work up No results for input(s): VITAMINB12, FOLATE, FERRITIN, TIBC, IRON, RETICCTPCT in the last 72 hours. Urinalysis No results found for: COLORURINE, APPEARANCEUR, LABSPEC, PHURINE, GLUCOSEU,  HGBUR, BILIRUBINUR, KETONESUR, PROTEINUR, UROBILINOGEN, NITRITE, LEUKOCYTESUR  Microbiology No results found for this or any previous visit (from the past 240 hour(s)).  Time coordinating discharge: Approximately 40 minutes  [id number], MD  [facility] 04/19/2020, 10:28 AM

## 2020-04-20 ENCOUNTER — Other Ambulatory Visit: Payer: Self-pay | Admitting: Surgery

## 2020-04-20 DIAGNOSIS — K810 Acute cholecystitis: Secondary | ICD-10-CM

## 2020-04-30 ENCOUNTER — Other Ambulatory Visit: Payer: Self-pay | Admitting: Physician Assistant

## 2020-04-30 ENCOUNTER — Ambulatory Visit
Admission: RE | Admit: 2020-04-30 | Discharge: 2020-04-30 | Disposition: A | Discharge status: Home / Self Care | Payer: Self-pay | Source: Ambulatory Visit | Attending: Surgery | Admitting: Surgery

## 2020-04-30 ENCOUNTER — Ambulatory Visit
Admission: RE | Admit: 2020-04-30 | Discharge: 2020-04-30 | Disposition: A | Discharge status: Home / Self Care | Payer: Medicaid Other | Source: Ambulatory Visit | Attending: Surgery | Admitting: Surgery

## 2020-04-30 ENCOUNTER — Ambulatory Visit
Admission: RE | Admit: 2020-04-30 | Discharge: 2020-04-30 | Disposition: A | Discharge status: Home / Self Care | Payer: Medicaid Other | Source: Ambulatory Visit | Attending: Student | Admitting: Student

## 2020-04-30 ENCOUNTER — Encounter: Payer: Self-pay | Admitting: Radiology

## 2020-04-30 DIAGNOSIS — K810 Acute cholecystitis: Secondary | ICD-10-CM

## 2020-04-30 HISTORY — PX: IR RADIOLOGIST EVAL & MGMT: IMG5224

## 2020-04-30 MED ORDER — DEXTROSE 5 % IV SOLN: 2.0000 g | Freq: Once | INTRAVENOUS | Status: DC

## 2020-04-30 MED ORDER — IOPAMIDOL (ISOVUE-300) INJECTION 61%
125.0000 mL | Freq: Once | INTRAVENOUS | Status: AC | PRN
  Administered 2020-04-30: 125 mL via INTRAVENOUS

## 2020-04-30 NOTE — Progress Notes (Addendum)
Chief Complaint: Drain follow up  Referring Physician(s): Dr. Donell Beers  Supervising Physician: Ruel Favors  History of Present Illness: Amber May is a 39 y.o. female who was admitted 04/02/20 with acute gallstone pancreatitis as well as acute calculus cholecystitis, poor surgical candidate  She is s/p cholecystostomy tube placement and paracentesis yielding bilious ascites on 6/30 by Dr. Archer Asa  She then underwent exchange/repositioning 7/8 and drain placement in the left lateral abdomen by Dr. Archer Asa.  She is here today for CT Abdomen Pelvis with contrast for left lateral drain assessment as well as cholecystostomy tube injection.  She is no longer taking antibiotics. She is flushing both drains daily.  She reports about 50 mL for each drain daily.  The chole drain shows clearish yellow fluid. The lateral abdomen drain shows cloudy yellow fluid.  She denies fever, chills, N/V. But she does c/o loss of appetite.  Past Medical History:  Diagnosis Date  . Morbid obesity with BMI of 50.0-59.9, adult (HCC) 04/05/2020  . Obesity   . Thyroid disease     Past Surgical History:  Procedure Laterality Date  . IR EXCHANGE BILIARY DRAIN  04/16/2020  . IR PARACENTESIS  04/08/2020  . IR PERC CHOLECYSTOSTOMY  04/08/2020    Allergies: Patient has no known allergies.  Medications: Prior to Admission medications   Medication Sig Start Date End Date Taking? Authorizing Provider  levothyroxine (SYNTHROID) 125 MCG tablet Take 125 mcg by mouth daily before breakfast.    [provider]  polyethylene glycol (MIRALAX / GLYCOLAX) 17 g packet Take 17 g by mouth daily. 04/20/20   Tyrone Nine, MD  senna (SENOKOT) 8.6 MG TABS tablet Take 1 tablet (8.6 mg total) by mouth daily. 04/19/20   Tyrone Nine, MD     Family History  Problem Relation Age of Onset  . Diabetes Mellitus II Maternal Grandmother     Social History   Socioeconomic History  . Marital status:  Single    Spouse name: Not on file  . Number of children: Not on file  . Years of education: Not on file  . Highest education level: Not on file  Occupational History  . Not on file  Tobacco Use  . Smoking status: Never Smoker  . Smokeless tobacco: Never Used  Substance and Sexual Activity  . Alcohol use: Not Currently  . Drug use: Not Currently  . Sexual activity: Not on file  Other Topics Concern  . Not on file  Social History Narrative  . Not on file   Social Determinants of Health   Financial Resource Strain:   . Difficulty of Paying Living Expenses:   Food Insecurity:   . Worried About Programme researcher, broadcasting/film/video in the Last Year:   . Barista in the Last Year:   Transportation Needs:   . Freight forwarder (Medical):   Marland Kitchen Lack of Transportation (Non-Medical):   Physical Activity:   . Days of Exercise per Week:   . Minutes of Exercise per Session:   Stress:   . Feeling of Stress :   Social Connections:   . Frequency of Communication with Friends and Family:   . Frequency of Social Gatherings with Friends and Family:   . Attends Religious Services:   . Active Member of Clubs or Organizations:   . Attends Banker Meetings:   Marland Kitchen Marital Status:      Review of Systems: A 12 point ROS discussed and pertinent  positives are indicated in the HPI above.  All other systems are negative.  Review of Systems  Vital Signs: LMP 04/16/2020 (Within Days)   Physical Exam Vitals reviewed.  Constitutional:      Appearance: She is obese.  Cardiovascular:     Rate and Rhythm: Normal rate.  Pulmonary:     Effort: Pulmonary effort is normal. No respiratory distress.  Abdominal:     Palpations: Abdomen is soft.     Comments: RUQ chole drain and perc drain in place.  Neurological:     General: No focal deficit present.     Mental Status: She is alert and oriented to person, place, and time.  Psychiatric:        Mood and Affect: Mood normal.        Behavior:  Behavior normal.        Thought Content: Thought content normal.        Judgment: Judgment normal.        Imaging: CT ABDOMEN PELVIS WO CONTRAST  Result Date: 04/14/2020 CLINICAL DATA:  39 year old female with cholecystitis status post percutaneous cholecystostomy. Continued leukocytosis. EXAM: CT ABDOMEN AND PELVIS WITHOUT CONTRAST TECHNIQUE: Multidetector CT imaging of the abdomen and pelvis was performed following the standard protocol without IV contrast. COMPARISON:  CT abdomen pelvis dated 04/02/2020. FINDINGS: Evaluation of this exam is limited in the absence of intravenous contrast. Lower chest: There are trace bilateral pleural effusions with bibasilar patchy densities which may represent atelectasis or infiltrate. Clinical correlation is recommended. There is mild eventration of the left hemidiaphragm. Coronary vascular calcifications noted. No intra-abdominal free air. There is a large fluid collection lateral to the spleen extending inferiorly to the anterior pelvis. This fluid measures approximately 11 x 13 cm in axial dimensions and 34 cm in craniocaudal length. There is associated mass effect on the peritoneal structures. In addition there is a smaller fluid in the right abdomen extending from the subhepatic area inferiorly. Hepatobiliary: The liver is unremarkable. No intrahepatic biliary ductal dilatation. The gallbladder is contracted. A percutaneous cholecystostomy is noted with pigtail tip in the region of the gallbladder fundus. No calcified gallstone. Pancreas: Unremarkable. No pancreatic ductal dilatation or surrounding inflammatory changes. Spleen: The spleen is mildly displaced medially secondary to mass effect caused by adjacent fluid. The spleen is otherwise unremarkable. Adrenals/Urinary Tract: The adrenal glands unremarkable. There is no hydronephrosis or nephrolithiasis on either side. The visualized ureters and urinary bladder appear unremarkable. Stomach/Bowel: There is no  bowel obstruction or active inflammation. There is displacement of the bowel by the abdominal fluid collection. Vascular/Lymphatic: The abdominal aorta and IVC are grossly unremarkable. No portal venous gas. There is no adenopathy. Reproductive: The uterus is anteverted and grossly unremarkable. No adnexal masses. Other: There is subcutaneous edema. Musculoskeletal: Mild degenerative changes of the spine. No acute osseous pathology. IMPRESSION: 1. Large fluid collection lateral to the spleen extending inferiorly to the anterior pelvis with associated mass effect on the peritoneal structures. 2. Percutaneous cholecystostomy with pigtail tip in the region of the gallbladder fundus. 3. No bowel obstruction. 4. Trace bilateral pleural effusions with bibasilar patchy densities which may represent atelectasis or infiltrate. Clinical correlation is recommended. 5. Aortic Atherosclerosis (ICD10-I70.0). Electronically Signed   By: Elgie Collard M.D.   On: 04/14/2020 19:56   CT ABDOMEN PELVIS WO CONTRAST  Result Date: 04/02/2020 CLINICAL DATA:  Abdominal distension. EXAM: CT ABDOMEN AND PELVIS WITHOUT CONTRAST TECHNIQUE: Multidetector CT imaging of the abdomen and pelvis was performed following the standard protocol  without IV contrast. COMPARISON:  None. FINDINGS: Lower chest: Motion artifact through the lung bases. Elevation of right hemidiaphragm with compressive atelectasis. Possible trace right pleural effusion. Hepatobiliary: Enlarged liver spanning 20 cm cranial caudal with mild steatosis. No evidence of discrete focal lesion. Motion limits assessment for capsular nodularity. Layering hyperdensity in the gallbladder. Ascites adjacent to the gallbladder which is similar to the ascites adjacent to the liver. Common bile duct is not well-defined, there is no obvious biliary dilatation. Pancreas: Peripancreatic fat stranding and edema adjacent to the head and uncinate process. Small amount of non organized  peripancreatic fluid. No ductal dilatation. No evidence of acute peripancreatic collection. Cannot assess for necrosis in the absence of IV contrast. Spleen: Normal in size without focal abnormality. Adrenals/Urinary Tract: Normal adrenal glands. No renal stones or hydronephrosis. Both ureters are decompressed without stones along the course. Urinary bladder is nondistended. Stomach/Bowel: Fluid/ingested material in the stomach. Mild duodenal wall thickening in the fourth portion adjacent to the pancreatic inflammation. There is no small bowel obstruction or inflammatory change. Detailed bowel assessment is limited in the absence of contrast and presence of abdominal ascites. The majority of the colon is decompressed. Vascular/Lymphatic: Normal caliber abdominal aorta. Few prominent periportal lymph nodes are likely reactive. Reproductive: Abnormal low-density in the lower uterine segment/cervix spanning 18 mm, nonspecific. No evidence of adnexal mass. Other: Small to moderate volume of abdominopelvic ascites. There is free fluid in both upper quadrants, both pericolic gutters, mesentery, and tracking into the pelvis. No free air or organized fluid collection. Small fat containing umbilical hernia. Musculoskeletal: Degenerative change in the spine. There are no acute or suspicious osseous abnormalities. IMPRESSION: 1. Acute edematous pancreatitis. No evidence of acute peripancreatic collection. 2. Hepatomegaly and hepatic steatosis. Layering hyperdensity in the gallbladder may represent sludge or layering stones. 3. Small to moderate volume of abdominopelvic ascites. This may be in part related to pancreatitis, underlying liver disease, or combination thereof. 4. Abnormal low-density in the lower uterine segment/cervix spanning 18 mm, nonspecific. Recommend nonemergent pelvic ultrasound for characterization on an elective outpatient basis. Electronically Signed   By: Narda RutherfordMelanie  Sanford M.D.   On: 04/02/2020 20:06    NM Hepatobiliary Liver Func  Result Date: 04/06/2020 CLINICAL DATA:  Abdominal pain. EXAM: NUCLEAR MEDICINE HEPATOBILIARY IMAGING TECHNIQUE: Sequential images of the abdomen were obtained out to 60 minutes following intravenous administration of radiopharmaceutical. RADIOPHARMACEUTICALS:  5.37 mCi Tc-2472m  Choletec IV COMPARISON:  April 03, 2020.  April 02, 2020. FINDINGS: Prompt uptake and biliary excretion of activity by the liver is seen. However, there is no definite evidence of uptake within the gallbladder, even after the administration of 3 mg of morphine intravenously. Biliary activity passes into small bowel, consistent with patent common bile duct. IMPRESSION: No definite evidence of uptake is noted within the gallbladder lumen, even after the administration of morphine intravenously. This is concerning for possible cystic duct obstruction and acute cholecystitis. Electronically Signed   By: Lupita RaiderJames  Green Jr M.D.   On: 04/06/2020 12:12   MR ABDOMEN MRCP WO CONTRAST  Result Date: 04/03/2020 CLINICAL DATA:  Cholelithiasis. Known pancreatitis. Patient had difficulty breathing and was unable to complete the last series of the exam. EXAM: MRI ABDOMEN WITHOUT CONTRAST  (INCLUDING MRCP) TECHNIQUE: Multiplanar multisequence MR imaging of the abdomen was performed. Heavily T2-weighted images of the biliary and pancreatic ducts were obtained, and three-dimensional MRCP images were rendered by post processing. COMPARISON:  CT scan 04/02/2020 FINDINGS: Lower chest: Bilateral pleural effusions associated with bibasilar collapse/consolidative  changes. Hepatobiliary: Liver measures 20.8 cm craniocaudal length, enlarged. Loss of signal in the liver parenchyma on out of phase T1 imaging suggests fatty deposition. No focal abnormality in the liver parenchyma on this noncontrast motion degraded study. Numerous tiny gallstones evident with apparent gallbladder wall thickening. No intrahepatic biliary duct dilatation. No  extrahepatic biliary duct dilatation. Study is markedly limited, but no definite choledocholithiasis. Axial T2 haste imaging demonstrates some central flow artifact in the common duct, but no definite ductal stones. Pancreas: No main duct dilatation. No gross mass lesion in the pancreas. Mild edema seen in the region of the pancreatic head. Spleen:  No splenomegaly. No focal mass lesion. Adrenals/Urinary Tract: No adrenal nodule or mass. No hydronephrosis. No gross renal mass on this noncontrast study. Probable 11 mm cyst interpolar left kidney. Stomach/Bowel: Stomach is unremarkable. No gastric wall thickening. No evidence of outlet obstruction. Duodenum is normally positioned as is the ligament of Treitz. No small bowel or colonic dilatation within the visualized abdomen. Vascular/Lymphatic: No abdominal aortic aneurysm. There is no gastrohepatic or hepatoduodenal ligament lymphadenopathy. No retroperitoneal or mesenteric lymphadenopathy. Other:  Moderate volume intraperitoneal free fluid. Musculoskeletal: No suspicious marrow signal abnormality within the visualized bony anatomy. IMPRESSION: 1. Limited study due to motion artifact and lack of intravenous contrast. Patient was unable to complete the entire exam. 2. Cholelithiasis with apparent gallbladder wall thickening. No intrahepatic or extrahepatic biliary duct dilatation. No definite choledocholithiasis. If there is clinical concern for acute cholecystitis, abdominal ultrasound or nuclear scintigraphy may prove helpful to further evaluate. 3. Mild edema in the region of the pancreatic head. No main duct dilatation. Imaging features may be related to pancreatitis. 4. Hepatomegaly with steatosis. 5. Moderate volume intraperitoneal free fluid. 6. Bilateral pleural effusions associated with bibasilar collapse/consolidative changes. Electronically Signed   By: Kennith Center M.D.   On: 04/03/2020 12:02   MR 3D Recon At Scanner  Result Date: 04/03/2020 CLINICAL  DATA:  Cholelithiasis. Known pancreatitis. Patient had difficulty breathing and was unable to complete the last series of the exam. EXAM: MRI ABDOMEN WITHOUT CONTRAST  (INCLUDING MRCP) TECHNIQUE: Multiplanar multisequence MR imaging of the abdomen was performed. Heavily T2-weighted images of the biliary and pancreatic ducts were obtained, and three-dimensional MRCP images were rendered by post processing. COMPARISON:  CT scan 04/02/2020 FINDINGS: Lower chest: Bilateral pleural effusions associated with bibasilar collapse/consolidative changes. Hepatobiliary: Liver measures 20.8 cm craniocaudal length, enlarged. Loss of signal in the liver parenchyma on out of phase T1 imaging suggests fatty deposition. No focal abnormality in the liver parenchyma on this noncontrast motion degraded study. Numerous tiny gallstones evident with apparent gallbladder wall thickening. No intrahepatic biliary duct dilatation. No extrahepatic biliary duct dilatation. Study is markedly limited, but no definite choledocholithiasis. Axial T2 haste imaging demonstrates some central flow artifact in the common duct, but no definite ductal stones. Pancreas: No main duct dilatation. No gross mass lesion in the pancreas. Mild edema seen in the region of the pancreatic head. Spleen:  No splenomegaly. No focal mass lesion. Adrenals/Urinary Tract: No adrenal nodule or mass. No hydronephrosis. No gross renal mass on this noncontrast study. Probable 11 mm cyst interpolar left kidney. Stomach/Bowel: Stomach is unremarkable. No gastric wall thickening. No evidence of outlet obstruction. Duodenum is normally positioned as is the ligament of Treitz. No small bowel or colonic dilatation within the visualized abdomen. Vascular/Lymphatic: No abdominal aortic aneurysm. There is no gastrohepatic or hepatoduodenal ligament lymphadenopathy. No retroperitoneal or mesenteric lymphadenopathy. Other:  Moderate volume intraperitoneal free fluid. Musculoskeletal:  No  suspicious marrow signal abnormality within the visualized bony anatomy. IMPRESSION: 1. Limited study due to motion artifact and lack of intravenous contrast. Patient was unable to complete the entire exam. 2. Cholelithiasis with apparent gallbladder wall thickening. No intrahepatic or extrahepatic biliary duct dilatation. No definite choledocholithiasis. If there is clinical concern for acute cholecystitis, abdominal ultrasound or nuclear scintigraphy may prove helpful to further evaluate. 3. Mild edema in the region of the pancreatic head. No main duct dilatation. Imaging features may be related to pancreatitis. 4. Hepatomegaly with steatosis. 5. Moderate volume intraperitoneal free fluid. 6. Bilateral pleural effusions associated with bibasilar collapse/consolidative changes. Electronically Signed   By: Kennith Center M.D.   On: 04/03/2020 12:02   IR Perc Cholecystostomy  Result Date: 04/08/2020 INDICATION: 39 year old morbidly obese female with acute gallstone pancreatitis an additional clinical concerns for acute calculus cholecystitis given nonvisualization of gallbladder on HIDA scan. Of note, the gallbladder is not distended but is contracted and completely filled with stones. She is not currently a surgical candidate and presents for attempted percutaneous cholecystostomy tube placement. EXAM: IR PARACENTESIS; CHOLECYSTOSTOMY MEDICATIONS: In patient currently receiving intravenous meropenem. No additional antibiotic prophylaxis was administered. ANESTHESIA/SEDATION: Moderate (conscious) sedation was employed during this procedure. A total of Versed 8 mg and Fentanyl 200 mcg was administered intravenously. Moderate Sedation Time: 54 minutes. The patient's level of consciousness and vital signs were monitored continuously by radiology nursing throughout the procedure under my direct supervision. FLUOROSCOPY TIME:  Fluoroscopy Time: 2 minutes 42 seconds (215 mGy). COMPLICATIONS: None immediate. PROCEDURE:  Informed written consent was obtained from the patient after a thorough discussion of the procedural risks, benefits and alternatives. All questions were addressed. Maximal Sterile Barrier Technique was utilized including caps, mask, sterile gowns, sterile gloves, sterile drape, hand hygiene and skin antiseptic. A timeout was performed prior to the initiation of the procedure. The right upper quadrant was interrogated with ultrasound. There has been a significant increase in ascites compared to prior CT imaging. The liver is floating in ascites. Proceeding with placement of a percutaneous cholecystostomy tube in this setting is not ideal given the increased risk for bleeding and bile leak. Therefore, the decision was made to proceed with paracentesis to drain the fluid around the liver. 1% lidocaine with epinephrine was used for local anesthesia. Following this, a 6 Fr Safe-T-Centesis catheter was introduced. An ultrasound image was saved for documentation purposes. The paracentesis was performed. The catheter was removed and a dressing was applied. The patient tolerated the procedure well without immediate post procedural complication. Approximately 4800 mL of bilious ascites was successfully aspirated. Repeat ultrasound imaging demonstrates that the liver is again opposed to the abdominal wall. Therefore, a second skin entry site was selected. Local anesthesia was again attained by infiltration with 1% lidocaine. Under real-time ultrasound guidance, a 21 gauge Accustick needle was advanced along a short transhepatic course and into the stone filled gallbladder. A gentle hand injection of contrast material performed through the needle opacifies the stone filled gallbladder. There are innumerous filling defects throughout the gallbladder lumen. A 0.018 wire was successfully coiled in the gallbladder fundus. The needle was exchanged for the Accustick transitional sheath which was advanced over the wire and into the  gallbladder lumen. The 0.018 wire was then exchanged for a 0.035 Amplatz wire. The skin and transhepatic tract were dilated to 10 [place] and a Cook 10.2 [place] all-purpose drainage catheter was advanced over the wire and formed in the gallbladder fundus. Gentle contrast injection confirms that  the tube is within the gallbladder. No evidence of filling of the cystic duct. The catheter was connected to gravity bag drainage and secured to the skin with 0 Prolene suture. IMPRESSION: 1. Significant interval increase in ascites compared to prior CT and MRI imaging. Paracentesis was required prior to cholecystostomy tube places mint. 2. Successful paracentesis yielding 4.8 L of bilious appearing ascites. 3. Successful placement of transhepatic percutaneous cholecystostomy tube for a diagnosis of acute calculus cholecystitis and gallstone pancreatitis. Signed, Sterling Big, MD, RPVI [facility] Rad[facility] Electronically Signed   By: Malachy Moan M.D.   On: 04/08/2020 16:13   DG CHEST PORT 1 VIEW  Result Date: 04/07/2020 CLINICAL DATA:  Shortness of breath, acute pancreatitis EXAM: PORTABLE CHEST 1 VIEW COMPARISON:  Portable exam 1528 hours compared to 04/06/2020 FINDINGS: Rotated to the LEFT. Normal heart size and mediastinal contours. Significantly decreased lung volumes with bibasilar atelectasis versus infiltrate. Upper lungs clear. No pleural effusion or pneumothorax. Bones unremarkable. IMPRESSION: Significantly decreased lung volumes with bibasilar atelectasis versus infiltrate. Electronically Signed   By: Ulyses Southward M.D.   On: 04/07/2020 15:54   DG CHEST PORT 1 VIEW  Result Date: 04/06/2020 CLINICAL DATA:  Shortness of breath, tachycardia EXAM: PORTABLE CHEST 1 VIEW COMPARISON:  04/04/2020 FINDINGS: Cardiomegaly. Low lung volumes with vascular congestion and bibasilar atelectasis. Suspect small layering effusions. No acute bony abnormality.  IMPRESSION: Small bilateral effusions with bibasilar opacities, likely atelectasis. Cardiomegaly, vascular congestion. Electronically Signed   By: Charlett Nose M.D.   On: 04/06/2020 23:41   DG CHEST PORT 1 VIEW  Result Date: 04/04/2020 CLINICAL DATA:  Acute respiratory failure EXAM: PORTABLE CHEST 1 VIEW COMPARISON:  April 03, 2020 FINDINGS: Bilateral pleural effusions with underlying opacities. No other changes. IMPRESSION: Stable bilateral effusions with underlying opacities, favored represent atelectasis. No other change. Recommend follow-up to resolution. Electronically Signed   By: Gerome Sam III M.D   On: 04/04/2020 08:37   DG CHEST PORT 1 VIEW  Result Date: 04/03/2020 CLINICAL DATA:  Hypoxia, shortness of breath EXAM: PORTABLE CHEST 1 VIEW COMPARISON:  04/02/2020 FINDINGS: Examination is significantly limited secondary to poor penetration from patient body habitus, semiupright portable technique, and poor inspiratory effort. Very low lung volumes with accentuation of the heart size. Hazy and streaky bibasilar opacities suggesting small bilateral pleural effusions with bibasilar atelectasis. No pneumothorax. IMPRESSION: Limited examination. Hazy and streaky bibasilar opacities suggesting small bilateral pleural effusions with bibasilar atelectasis. Electronically Signed   By: Duanne Guess D.O.   On: 04/03/2020 13:59   DG Chest Port 1 View  Result Date: 04/02/2020 CLINICAL DATA:  Sepsis EXAM: PORTABLE CHEST 1 VIEW COMPARISON:  None. FINDINGS: Low lung volume with hypoventilatory change. Atelectasis at the right base. Borderline cardiomegaly augmented by low lung volume. No pneumothorax. IMPRESSION: Low lung volume with atelectasis at the right base. Electronically Signed   By: Jasmine Pang M.D.   On: 04/02/2020 20:08   IR EXCHANGE BILIARY DRAIN  Result Date: 04/16/2020 INDICATION: 39 year old female with acute calculus cholecystitis. A percutaneous cholecystostomy tube was placed on  04/08/2020. Her gallbladder is densely packed with stones and the tube could just be formed within the gallbladder fundus. Recent CT imaging demonstrates that the tube is slowly migrating out of the gallbladder. She presents for tube repositioning and exchange. EXAM: Cholecystostomy tube exchange MEDICATIONS: 3.375 g Zosyn; The antibiotic was administered within an appropriate time frame prior to the initiation of the procedure. ANESTHESIA/SEDATION: Moderate (conscious) sedation was  employed during this procedure. A total of Versed 2 mg and Fentanyl 100 mcg was administered intravenously. Moderate Sedation Time: 27 minutes. The patient's level of consciousness and vital signs were monitored continuously by radiology nursing throughout the procedure under my direct supervision. FLUOROSCOPY TIME:  Fluoroscopy Time: 0 minutes 48 seconds (7 mGy). COMPLICATIONS: None immediate. PROCEDURE: Informed written consent was obtained from the patient after a thorough discussion of the procedural risks, benefits and alternatives. All questions were addressed. Maximal Sterile Barrier Technique was utilized including caps, mask, sterile gowns, sterile gloves, sterile drape, hand hygiene and skin antiseptic. A timeout was performed prior to the initiation of the procedure. I hand injection of contrast material through the existing tube demonstrates that the tube remains within the gallbladder lumen. The gallbladder remains packed with stones. The tube was cut and removed over an Amplatz wire. The wire was manipulated in till it extended toward the neck of the gallbladder. A new Cook 10.2 [place] all-purpose drainage catheter was then advanced over the wire and formed in the region of the gallbladder neck. The tube was secured to the skin with 0 Prolene suture. An image was obtained and stored for the medical record. The patient tolerated the procedure well. IMPRESSION: Successful exchange and reposition for a new 10.2 French  percutaneous transhepatic cholecystostomy tube. The tube is now position deeper within the gallbladder lumen nearly at the gallbladder neck. Electronically Signed   By: Malachy Moan M.D.   On: 04/16/2020 15:04   IR IMAGE GUIDED DRAINAGE BY PERCUTANEOUS CATHETER  Result Date: 04/16/2020 INDICATION: 39 year old female with acute calculus cholecystitis and gallstone pancreatitis complicated by intra-abdominal fluid collections. Recent paracentesis performed 04/08/2020 yielded several L of bilious ascites. She now has a large loculated fluid collection in the left hemiabdomen and presents for drain placement. EXAM: Drain placement utilizing ultrasound guidance MEDICATIONS: The patient is currently admitted to the hospital and receiving intravenous antibiotics. The antibiotics were administered within an appropriate time frame prior to the initiation of the procedure. ANESTHESIA/SEDATION: None COMPLICATIONS: None immediate. PROCEDURE: Informed written consent was obtained from the patient after a thorough discussion of the procedural risks, benefits and alternatives. All questions were addressed. Maximal Sterile Barrier Technique was utilized including caps, mask, sterile gowns, sterile gloves, sterile drape, hand hygiene and skin antiseptic. A timeout was performed prior to the initiation of the procedure. The left upper quadrant was interrogated with ultrasound. A large complex fluid collection is identified. Local anesthesia was attained by infiltration with 1% lidocaine. A small dermatotomy was made. Under real-time ultrasound guidance, the fluid collection was accessed with a 15 cm 18 gauge trocar needle. A wire was advanced into the fluid collection. The skin tract was then dilated to 12 [place]. A Cook 12 [place] biliary drainage catheter was then advanced over the wire and formed. Aspiration yields nearly 1 L of dark green bilious fluid containing debris. The catheter was secured to the skin with 0 Prolene  suture. IMPRESSION: Successful placement of a 19 French biliary drainage catheter into the large left abdominal fluid collection. Aspiration yields nearly 1 L of dark green bilious fluid and debris. Electronically Signed   By: Malachy Moan M.D.   On: 04/16/2020 15:06   US Abdomen Limited RUQ  Result Date: 04/03/2020 CLINICAL DATA:  Pancreatitis. EXAM: ULTRASOUND ABDOMEN LIMITED RIGHT UPPER QUADRANT COMPARISON:  MRCP 04/03/2020 FINDINGS: The examination is limited due to patient body habitus. Gallbladder: Cholecystolithiasis. Thickened appearance of the gallbladder wall measuring 5 mm. No sonographic Murphy sign  is elicited by the scanning technologist. Common bile duct: Diameter: The common duct is dilated measuring 7 mm in diameter. Liver: Generalized increased hepatic parenchymal echogenicity. No definite focal liver lesion is identified. Portal vein is patent on color Doppler imaging with normal direction of blood flow towards the liver. Other: Moderate ascites within the visualized abdomen. IMPRESSION: Limited examination as described. There is cholecystolithiasis. Additionally, there is a thickened appearance of the gallbladder wall measuring 5 mm. Acute cholecystitis cannot be excluded, although gallbladder wall thickening is a nonspecific finding in the setting of abdominal ascites, and a nuclear medicine HIDA scan may be helpful for further assessment. Mild extrahepatic biliary ductal dilatation with the common duct measuring 7 mm in diameter. Hyperechogenicity of the hepatic parenchyma. Findings are consistent with the hepatic steatosis demonstrated on same day MRCP. Moderate ascites within the visualized abdomen. Electronically Signed   By: Jackey Loge DO   On: 04/03/2020 17:05   IR Paracentesis  Result Date: 04/08/2020 INDICATION: 39 year old morbidly obese female with acute gallstone pancreatitis an additional clinical concerns for acute calculus cholecystitis given nonvisualization of  gallbladder on HIDA scan. Of note, the gallbladder is not distended but is contracted and completely filled with stones. She is not currently a surgical candidate and presents for attempted percutaneous cholecystostomy tube placement. EXAM: IR PARACENTESIS; CHOLECYSTOSTOMY MEDICATIONS: In patient currently receiving intravenous meropenem. No additional antibiotic prophylaxis was administered. ANESTHESIA/SEDATION: Moderate (conscious) sedation was employed during this procedure. A total of Versed 8 mg and Fentanyl 200 mcg was administered intravenously. Moderate Sedation Time: 54 minutes. The patient's level of consciousness and vital signs were monitored continuously by radiology nursing throughout the procedure under my direct supervision. FLUOROSCOPY TIME:  Fluoroscopy Time: 2 minutes 42 seconds (215 mGy). COMPLICATIONS: None immediate. PROCEDURE: Informed written consent was obtained from the patient after a thorough discussion of the procedural risks, benefits and alternatives. All questions were addressed. Maximal Sterile Barrier Technique was utilized including caps, mask, sterile gowns, sterile gloves, sterile drape, hand hygiene and skin antiseptic. A timeout was performed prior to the initiation of the procedure. The right upper quadrant was interrogated with ultrasound. There has been a significant increase in ascites compared to prior CT imaging. The liver is floating in ascites. Proceeding with placement of a percutaneous cholecystostomy tube in this setting is not ideal given the increased risk for bleeding and bile leak. Therefore, the decision was made to proceed with paracentesis to drain the fluid around the liver. 1% lidocaine with epinephrine was used for local anesthesia. Following this, a 6 Fr Safe-T-Centesis catheter was introduced. An ultrasound image was saved for documentation purposes. The paracentesis was performed. The catheter was removed and a dressing was applied. The patient tolerated  the procedure well without immediate post procedural complication. Approximately 4800 mL of bilious ascites was successfully aspirated. Repeat ultrasound imaging demonstrates that the liver is again opposed to the abdominal wall. Therefore, a second skin entry site was selected. Local anesthesia was again attained by infiltration with 1% lidocaine. Under real-time ultrasound guidance, a 21 gauge Accustick needle was advanced along a short transhepatic course and into the stone filled gallbladder. A gentle hand injection of contrast material performed through the needle opacifies the stone filled gallbladder. There are innumerous filling defects throughout the gallbladder lumen. A 0.018 wire was successfully coiled in the gallbladder fundus. The needle was exchanged for the Accustick transitional sheath which was advanced over the wire and into the gallbladder lumen. The 0.018 wire was then exchanged for a  0.035 Amplatz wire. The skin and transhepatic tract were dilated to 10 [place] and a Cook 10.2 [place] all-purpose drainage catheter was advanced over the wire and formed in the gallbladder fundus. Gentle contrast injection confirms that the tube is within the gallbladder. No evidence of filling of the cystic duct. The catheter was connected to gravity bag drainage and secured to the skin with 0 Prolene suture. IMPRESSION: 1. Significant interval increase in ascites compared to prior CT and MRI imaging. Paracentesis was required prior to cholecystostomy tube places mint. 2. Successful paracentesis yielding 4.8 L of bilious appearing ascites. 3. Successful placement of transhepatic percutaneous cholecystostomy tube for a diagnosis of acute calculus cholecystitis and gallstone pancreatitis. Signed, Sterling Big, MD, RPVI [facility] Electronically Signed   By: Malachy Moan M.D.   On: 04/08/2020 16:13    Labs:  CBC: Recent Labs     04/16/20 0332 04/17/20 0352 04/18/20 0443 04/19/20 0351  WBC 15.7* 13.3* 12.9* 11.0*  HGB 11.5* 11.1* 11.8* 11.1*  HCT 37.0 36.8 39.3 36.9  PLT 255 221 246 231    COAGS: Recent Labs    04/02/20 1847 04/08/20 0132  INR  --  1.2  APTT 26  --     BMP: Recent Labs    04/14/20 1158 04/16/20 0332 04/17/20 0352 04/18/20 0443  NA 139 140 140 139  K 4.2 3.9 4.1 4.4  CL 104 104 104 105  CO2 26 27 26 24   GLUCOSE 126* 102* 94 97  BUN 10 11 9 10   CALCIUM 8.3* 8.4* 8.5* 8.5*  CREATININE 0.70 0.64 0.62 0.61  GFRNONAA >60 >60 >60 >60  GFRAA >60 >60 >60 >60    LIVER FUNCTION TESTS: Recent Labs    04/14/20 1158 04/16/20 0332 04/17/20 0352 04/18/20 0443  BILITOT 0.8 0.8 0.8 0.8  AST 29 23 22 21   ALT 25 21 19 19   ALKPHOS 83 67 63 61  PROT 5.5* 5.4* 5.2* 5.8*  ALBUMIN 2.3* 2.3* 2.3* 2.7*    TUMOR MARKERS: No results for input(s): AFPTM, CEA, CA199, CHROMGRNA in the last 8760 hours.  Assessment/Plan:  Acute gallstone pancreatitis as well as acute calculus cholecystitis, poor surgical candidate.  S/p cholecystostomy tube placement and paracentesis yielding bilious ascites on 6/30 by Dr. Archer Asa  S/p exchange/repositioning 7/8 and drain placement in the left lateral abdomen by Dr. Archer Asa.  She has an addition fluid collection on CT today. No drain injection done today.      Will plan for image guided placement of an additional drain tomorrow around 3 pm.  She understands NPO after 8 am. She will arrive at 1:30.    Electronically Signed: Gwynneth Macleod PA-C 04/30/2020, 12:56 PM    Please refer to Dr. Chester Holstein attestation of this note for management and plan.

## 2020-05-01 ENCOUNTER — Other Ambulatory Visit: Payer: Self-pay

## 2020-05-01 ENCOUNTER — Other Ambulatory Visit: Payer: Self-pay | Admitting: Physician Assistant

## 2020-05-01 ENCOUNTER — Ambulatory Visit (HOSPITAL_COMMUNITY)
Admission: RE | Admit: 2020-05-01 | Discharge: 2020-05-01 | Disposition: A | Discharge status: Home / Self Care | Payer: Self-pay | Source: Ambulatory Visit | Attending: Physician Assistant | Admitting: Physician Assistant

## 2020-05-01 DIAGNOSIS — K810 Acute cholecystitis: Secondary | ICD-10-CM

## 2020-05-01 DIAGNOSIS — K651 Peritoneal abscess: Secondary | ICD-10-CM | POA: Insufficient documentation

## 2020-05-01 DIAGNOSIS — Z4803 Encounter for change or removal of drains: Secondary | ICD-10-CM | POA: Insufficient documentation

## 2020-05-01 DIAGNOSIS — K8000 Calculus of gallbladder with acute cholecystitis without obstruction: Secondary | ICD-10-CM | POA: Insufficient documentation

## 2020-05-01 HISTORY — PX: IR GUIDED DRAIN W CATHETER PLACEMENT: IMG719

## 2020-05-01 HISTORY — PX: IR EXCHANGE BILIARY DRAIN: IMG6046

## 2020-05-01 LAB — CBC
HCT: 38.1 % (ref 36.0–46.0)
Hemoglobin: 11.4 g/dL — ABNORMAL LOW (ref 12.0–15.0)
MCH: 25.1 pg — ABNORMAL LOW (ref 26.0–34.0)
MCHC: 29.9 g/dL — ABNORMAL LOW (ref 30.0–36.0)
MCV: 83.7 fL (ref 80.0–100.0)
Platelets: 507 10*3/uL — ABNORMAL HIGH (ref 150–400)
RBC: 4.55 MIL/uL (ref 3.87–5.11)
RDW: 15.9 % — ABNORMAL HIGH (ref 11.5–15.5)
WBC: 12.2 10*3/uL — ABNORMAL HIGH (ref 4.0–10.5)
nRBC: 0.3 % — ABNORMAL HIGH (ref 0.0–0.2)

## 2020-05-01 LAB — PREGNANCY, URINE: Preg Test, Ur: NEGATIVE

## 2020-05-01 LAB — PROTIME-INR
INR: 1.1 (ref 0.8–1.2)
Prothrombin Time: 13.8 seconds (ref 11.4–15.2)

## 2020-05-01 MED ORDER — HYDROCODONE-ACETAMINOPHEN 5-325 MG PO TABS: 1.0000 | ORAL_TABLET | ORAL | Status: DC | PRN

## 2020-05-01 MED ORDER — SODIUM CHLORIDE 0.9% FLUSH: 5.0000 mL | Freq: Three times a day (TID) | INTRAVENOUS | Status: DC

## 2020-05-01 MED ORDER — FENTANYL CITRATE (PF) 100 MCG/2ML IJ SOLN
INTRAMUSCULAR | Status: AC
  Filled 2020-05-01: qty 2

## 2020-05-01 MED ORDER — MIDAZOLAM HCL 2 MG/2ML IJ SOLN
INTRAMUSCULAR | Status: AC
  Filled 2020-05-01: qty 2

## 2020-05-01 MED ORDER — MIDAZOLAM HCL 2 MG/2ML IJ SOLN
INTRAMUSCULAR | Status: AC | PRN
  Administered 2020-05-01: 1 mg via INTRAVENOUS

## 2020-05-01 MED ORDER — LIDOCAINE HCL 1 % IJ SOLN
INTRAMUSCULAR | Status: AC
  Filled 2020-05-01: qty 20

## 2020-05-01 MED ORDER — IOHEXOL 300 MG/ML  SOLN
50.0000 mL | Freq: Once | INTRAMUSCULAR | Status: AC | PRN
  Administered 2020-05-01: 15 mL

## 2020-05-01 MED ORDER — HYDROCODONE-ACETAMINOPHEN 5-325 MG PO TABS
ORAL_TABLET | ORAL | Status: AC
  Administered 2020-05-01: 2 via ORAL
  Filled 2020-05-01: qty 2

## 2020-05-01 MED ORDER — SODIUM CHLORIDE 0.9 % IV SOLN: INTRAVENOUS | Status: DC

## 2020-05-01 MED ORDER — FENTANYL CITRATE (PF) 100 MCG/2ML IJ SOLN
INTRAMUSCULAR | Status: AC | PRN
  Administered 2020-05-01: 50 ug via INTRAVENOUS

## 2020-05-01 NOTE — H&P (Signed)
Chief Complaint: Patient was seen in consultation today for a right abdominal fluid collection.   Referring Physician(s): Dr. Miles Costain  Supervising Physician: Oley Balm  Patient Status: [facility] - Out-pt  History of Present Illness: Amber May is a 39 y.o. female with a medical history that includes thyroid disease and morbid obesity. She was admitted 04/02/20 with acute gallstone pancreatitis and acute calculus cholecystitis. Patient was deemed a poor surgical candidate and a percutaneous cholecystostomy drain was placed 04/08/20 by Dr. Archer Asa.  A perisplenic fluid collection was identified on a follow up CT scan 04/15/20. Dr. Archer Asa placed a left abdominal drain 04/16/20 and also exchanged the chole tube. The patient was discharged from the hospital 04/19/20 and she was seen in the outpatient clinic 04/30/20 for a follow up.   CT abdomen pelvis w/contrast 04/30/20: IMPRESSION: Stable cholecystostomy catheter within a collapsed gallbladder. Cholelithiasis noted.  Improvement in the large left abdominopelvic fluid collection following drain catheter placement.  New right abdominal large subhepatic collection. This is amenable to image guided drainage.  The patient presents today to the [facility] Interventional Radiology department for an image-guided aspiration of the right abdominal fluid collection with possible drain placement. This case was reviewed by Dr. Miles Costain.     Past Medical History:  Diagnosis Date  . Morbid obesity with BMI of 50.0-59.9, adult (HCC) 04/05/2020  . Obesity   . Thyroid disease     Past Surgical History:  Procedure Laterality Date  . IR EXCHANGE BILIARY DRAIN  04/16/2020  . IR PARACENTESIS  04/08/2020  . IR PERC CHOLECYSTOSTOMY  04/08/2020  . IR RADIOLOGIST EVAL & MGMT  04/30/2020    Allergies: Patient has no known allergies.  Medications: Prior to Admission medications   Medication Sig Start Date End Date Taking? Authorizing Provider    levothyroxine (SYNTHROID) 125 MCG tablet Take 125 mcg by mouth daily before breakfast.   Yes [provider]  oxyCODONE-acetaminophen (PERCOCET/ROXICET) 5-325 MG tablet Take 1 tablet by mouth every 4 (four) hours as needed for severe pain.   Yes [provider]  polyethylene glycol (MIRALAX / GLYCOLAX) 17 g packet Take 17 g by mouth daily. 04/20/20  Yes Tyrone Nine, MD  senna (SENOKOT) 8.6 MG TABS tablet Take 1 tablet (8.6 mg total) by mouth daily. 04/19/20   Tyrone Nine, MD     Family History  Problem Relation Age of Onset  . Diabetes Mellitus II Maternal Grandmother     Social History   Socioeconomic History  . Marital status: Single    Spouse name: Not on file  . Number of children: Not on file  . Years of education: Not on file  . Highest education level: Not on file  Occupational History  . Not on file  Tobacco Use  . Smoking status: Never Smoker  . Smokeless tobacco: Never Used  Substance and Sexual Activity  . Alcohol use: Not Currently  . Drug use: Not Currently  . Sexual activity: Not on file  Other Topics Concern  . Not on file  Social History Narrative  . Not on file   Social Determinants of Health   Financial Resource Strain:   . Difficulty of Paying Living Expenses:   Food Insecurity:   . Worried About Programme researcher, broadcasting/film/video in the Last Year:   . Barista in the Last Year:   Transportation Needs:   . Freight forwarder (Medical):   Marland Kitchen Lack of Transportation (Non-Medical):   Physical  Activity:   . Days of Exercise per Week:   . Minutes of Exercise per Session:   Stress:   . Feeling of Stress :   Social Connections:   . Frequency of Communication with Friends and Family:   . Frequency of Social Gatherings with Friends and Family:   . Attends Religious Services:   . Active Member of Clubs or Organizations:   . Attends Banker Meetings:   Marland Kitchen Marital Status:     Review of Systems: A 12 point ROS discussed and  pertinent positives are indicated in the HPI above.  All other systems are negative.  Review of Systems  Constitutional: Positive for appetite change.  Respiratory: Positive for cough.   Cardiovascular: Positive for leg swelling. Negative for chest pain.  Gastrointestinal: Positive for abdominal pain. Negative for diarrhea, nausea and vomiting.       Diffuse pain.   Musculoskeletal: Positive for back pain.  Neurological: Negative for headaches.    Vital Signs: BP (!) 142/82   Pulse (!) 111   Temp 99.6 F (37.6 C) (Oral)   Ht 5\' 2"  (1.575 m)   Wt (!) 316 lb (143.3 kg)   LMP 04/16/2020 (Within Days)   SpO2 97%   BMI 57.80 kg/m   Physical Exam Constitutional:      General: She is not in acute distress.    Appearance: She is obese.  HENT:     Mouth/Throat:     Mouth: Mucous membranes are moist.     Pharynx: Oropharynx is clear.  Cardiovascular:     Rate and Rhythm: Normal rate and regular rhythm.     Pulses: Normal pulses.     Heart sounds: Normal heart sounds.  Pulmonary:     Effort: Pulmonary effort is normal.     Breath sounds: Normal breath sounds.     Comments: Frequent coughing.  Abdominal:     General: Bowel sounds are normal.     Palpations: Abdomen is soft.     Tenderness: There is abdominal tenderness.     Comments: Cholecystostomy and left abdominal drains present.  Purulent drainage observed at skin insertion sites. Purulent drainage in gravity bags.   Musculoskeletal:     Right lower leg: Edema present.     Left lower leg: Edema present.     Comments: +1 bilateral lower extremity edema.   Skin:    General: Skin is warm and dry.  Neurological:     Mental Status: She is alert and oriented to person, place, and time.     Imaging: CT ABDOMEN PELVIS WO CONTRAST  Result Date: 04/14/2020 CLINICAL DATA:  39 year old female with cholecystitis status post percutaneous cholecystostomy. Continued leukocytosis. EXAM: CT ABDOMEN AND PELVIS WITHOUT CONTRAST  TECHNIQUE: Multidetector CT imaging of the abdomen and pelvis was performed following the standard protocol without IV contrast. COMPARISON:  CT abdomen pelvis dated 04/02/2020. FINDINGS: Evaluation of this exam is limited in the absence of intravenous contrast. Lower chest: There are trace bilateral pleural effusions with bibasilar patchy densities which may represent atelectasis or infiltrate. Clinical correlation is recommended. There is mild eventration of the left hemidiaphragm. Coronary vascular calcifications noted. No intra-abdominal free air. There is a large fluid collection lateral to the spleen extending inferiorly to the anterior pelvis. This fluid measures approximately 11 x 13 cm in axial dimensions and 34 cm in craniocaudal length. There is associated mass effect on the peritoneal structures. In addition there is a smaller fluid in the right abdomen extending  from the subhepatic area inferiorly. Hepatobiliary: The liver is unremarkable. No intrahepatic biliary ductal dilatation. The gallbladder is contracted. A percutaneous cholecystostomy is noted with pigtail tip in the region of the gallbladder fundus. No calcified gallstone. Pancreas: Unremarkable. No pancreatic ductal dilatation or surrounding inflammatory changes. Spleen: The spleen is mildly displaced medially secondary to mass effect caused by adjacent fluid. The spleen is otherwise unremarkable. Adrenals/Urinary Tract: The adrenal glands unremarkable. There is no hydronephrosis or nephrolithiasis on either side. The visualized ureters and urinary bladder appear unremarkable. Stomach/Bowel: There is no bowel obstruction or active inflammation. There is displacement of the bowel by the abdominal fluid collection. Vascular/Lymphatic: The abdominal aorta and IVC are grossly unremarkable. No portal venous gas. There is no adenopathy. Reproductive: The uterus is anteverted and grossly unremarkable. No adnexal masses. Other: There is subcutaneous  edema. Musculoskeletal: Mild degenerative changes of the spine. No acute osseous pathology. IMPRESSION: 1. Large fluid collection lateral to the spleen extending inferiorly to the anterior pelvis with associated mass effect on the peritoneal structures. 2. Percutaneous cholecystostomy with pigtail tip in the region of the gallbladder fundus. 3. No bowel obstruction. 4. Trace bilateral pleural effusions with bibasilar patchy densities which may represent atelectasis or infiltrate. Clinical correlation is recommended. 5. Aortic Atherosclerosis (ICD10-I70.0). Electronically Signed   By: Elgie Collard M.D.   On: 04/14/2020 19:56   CT ABDOMEN PELVIS WO CONTRAST  Result Date: 04/02/2020 CLINICAL DATA:  Abdominal distension. EXAM: CT ABDOMEN AND PELVIS WITHOUT CONTRAST TECHNIQUE: Multidetector CT imaging of the abdomen and pelvis was performed following the standard protocol without IV contrast. COMPARISON:  None. FINDINGS: Lower chest: Motion artifact through the lung bases. Elevation of right hemidiaphragm with compressive atelectasis. Possible trace right pleural effusion. Hepatobiliary: Enlarged liver spanning 20 cm cranial caudal with mild steatosis. No evidence of discrete focal lesion. Motion limits assessment for capsular nodularity. Layering hyperdensity in the gallbladder. Ascites adjacent to the gallbladder which is similar to the ascites adjacent to the liver. Common bile duct is not well-defined, there is no obvious biliary dilatation. Pancreas: Peripancreatic fat stranding and edema adjacent to the head and uncinate process. Small amount of non organized peripancreatic fluid. No ductal dilatation. No evidence of acute peripancreatic collection. Cannot assess for necrosis in the absence of IV contrast. Spleen: Normal in size without focal abnormality. Adrenals/Urinary Tract: Normal adrenal glands. No renal stones or hydronephrosis. Both ureters are decompressed without stones along the course. Urinary  bladder is nondistended. Stomach/Bowel: Fluid/ingested material in the stomach. Mild duodenal wall thickening in the fourth portion adjacent to the pancreatic inflammation. There is no small bowel obstruction or inflammatory change. Detailed bowel assessment is limited in the absence of contrast and presence of abdominal ascites. The majority of the colon is decompressed. Vascular/Lymphatic: Normal caliber abdominal aorta. Few prominent periportal lymph nodes are likely reactive. Reproductive: Abnormal low-density in the lower uterine segment/cervix spanning 18 mm, nonspecific. No evidence of adnexal mass. Other: Small to moderate volume of abdominopelvic ascites. There is free fluid in both upper quadrants, both pericolic gutters, mesentery, and tracking into the pelvis. No free air or organized fluid collection. Small fat containing umbilical hernia. Musculoskeletal: Degenerative change in the spine. There are no acute or suspicious osseous abnormalities. IMPRESSION: 1. Acute edematous pancreatitis. No evidence of acute peripancreatic collection. 2. Hepatomegaly and hepatic steatosis. Layering hyperdensity in the gallbladder may represent sludge or layering stones. 3. Small to moderate volume of abdominopelvic ascites. This may be in part related to pancreatitis, underlying liver disease, or  combination thereof. 4. Abnormal low-density in the lower uterine segment/cervix spanning 18 mm, nonspecific. Recommend nonemergent pelvic ultrasound for characterization on an elective outpatient basis. Electronically Signed   By: Narda RutherfordMelanie  Sanford M.D.   On: 04/02/2020 20:06   NM Hepatobiliary Liver Func  Result Date: 04/06/2020 CLINICAL DATA:  Abdominal pain. EXAM: NUCLEAR MEDICINE HEPATOBILIARY IMAGING TECHNIQUE: Sequential images of the abdomen were obtained out to 60 minutes following intravenous administration of radiopharmaceutical. RADIOPHARMACEUTICALS:  5.37 mCi Tc-2255m  Choletec IV COMPARISON:  April 03, 2020.   April 02, 2020. FINDINGS: Prompt uptake and biliary excretion of activity by the liver is seen. However, there is no definite evidence of uptake within the gallbladder, even after the administration of 3 mg of morphine intravenously. Biliary activity passes into small bowel, consistent with patent common bile duct. IMPRESSION: No definite evidence of uptake is noted within the gallbladder lumen, even after the administration of morphine intravenously. This is concerning for possible cystic duct obstruction and acute cholecystitis. Electronically Signed   By: Lupita RaiderJames  Green Jr M.D.   On: 04/06/2020 12:12   CT ABDOMEN PELVIS W CONTRAST  Result Date: 04/30/2020 CLINICAL DATA:  Cholecystitis, biliary site ease, status post cholecystostomy and left lower quadrant abdominopelvic drain EXAM: CT ABDOMEN AND PELVIS WITH CONTRAST TECHNIQUE: Multidetector CT imaging of the abdomen and pelvis was performed using the standard protocol following bolus administration of intravenous contrast. CONTRAST:  125mL ISOVUE-300 IOPAMIDOL (ISOVUE-300) INJECTION 61% COMPARISON:  04/14/2020 FINDINGS: Lower chest: Minimal basilar atelectasis. Normal heart size. No pericardial effusion. Hepatobiliary: Entire liver dome is not imaged. No intrahepatic biliary dilatation. Hepatic and portal veins are patent. Cholecystostomy remains within the collapsed gallbladder. Cholelithiasis noted. New right abdominal subhepatic fluid collection extends along the entire right abdomen measuring 18 x 5.8 x 16.8 cm. This would be amenable to image guided drainage. Pancreas: Unremarkable. No pancreatic ductal dilatation or surrounding inflammatory changes. Spleen: Image portion unremarkable. Adrenals/Urinary Tract: Normal adrenal glands. No acute renal abnormality or hydronephrosis. There are a few scattered cortical hypodense cysts, all measuring 10 mm or less in size. No hydroureter or ureteral calculus. Bladder is collapsed. Stomach/Bowel: Negative for bowel  obstruction, significant dilatation, ileus, or free air. Appendix not visualized. Left lower quadrant abdominal drain also noted. Previous fluid collection in this region is largely collapsed. Small amount of loculated fluid over the bladder dome in the midline of the pelvis measures 8.7 x 4.4 x 3.4 cm. This collection may to communicate with the collapsed fluid collection at the left lower quadrant drain site. Vascular/Lymphatic: Normal caliber aorta. Negative for aneurysm or occlusive process. Mesenteric and renal vasculature all appear patent. No veno-occlusive process. No bulky adenopathy. Reproductive: Uterus and bilateral adnexa are unremarkable. Other: Intact abdominal wall.  No hernia. Musculoskeletal: Degenerative changes of the spine. No acute osseous finding. IMPRESSION: Stable cholecystostomy catheter within a collapsed gallbladder. Cholelithiasis noted. Improvement in the large left abdominopelvic fluid collection following drain catheter placement. New right abdominal large subhepatic collection. This is amenable to image guided drainage Electronically Signed   By: Judie PetitM.  Shick M.D.   On: 04/30/2020 15:51   MR ABDOMEN MRCP WO CONTRAST  Result Date: 04/03/2020 CLINICAL DATA:  Cholelithiasis. Known pancreatitis. Patient had difficulty breathing and was unable to complete the last series of the exam. EXAM: MRI ABDOMEN WITHOUT CONTRAST  (INCLUDING MRCP) TECHNIQUE: Multiplanar multisequence MR imaging of the abdomen was performed. Heavily T2-weighted images of the biliary and pancreatic ducts were obtained, and three-dimensional MRCP images were rendered by post processing.  COMPARISON:  CT scan 04/02/2020 FINDINGS: Lower chest: Bilateral pleural effusions associated with bibasilar collapse/consolidative changes. Hepatobiliary: Liver measures 20.8 cm craniocaudal length, enlarged. Loss of signal in the liver parenchyma on out of phase T1 imaging suggests fatty deposition. No focal abnormality in the liver  parenchyma on this noncontrast motion degraded study. Numerous tiny gallstones evident with apparent gallbladder wall thickening. No intrahepatic biliary duct dilatation. No extrahepatic biliary duct dilatation. Study is markedly limited, but no definite choledocholithiasis. Axial T2 haste imaging demonstrates some central flow artifact in the common duct, but no definite ductal stones. Pancreas: No main duct dilatation. No gross mass lesion in the pancreas. Mild edema seen in the region of the pancreatic head. Spleen:  No splenomegaly. No focal mass lesion. Adrenals/Urinary Tract: No adrenal nodule or mass. No hydronephrosis. No gross renal mass on this noncontrast study. Probable 11 mm cyst interpolar left kidney. Stomach/Bowel: Stomach is unremarkable. No gastric wall thickening. No evidence of outlet obstruction. Duodenum is normally positioned as is the ligament of Treitz. No small bowel or colonic dilatation within the visualized abdomen. Vascular/Lymphatic: No abdominal aortic aneurysm. There is no gastrohepatic or hepatoduodenal ligament lymphadenopathy. No retroperitoneal or mesenteric lymphadenopathy. Other:  Moderate volume intraperitoneal free fluid. Musculoskeletal: No suspicious marrow signal abnormality within the visualized bony anatomy. IMPRESSION: 1. Limited study due to motion artifact and lack of intravenous contrast. Patient was unable to complete the entire exam. 2. Cholelithiasis with apparent gallbladder wall thickening. No intrahepatic or extrahepatic biliary duct dilatation. No definite choledocholithiasis. If there is clinical concern for acute cholecystitis, abdominal ultrasound or nuclear scintigraphy may prove helpful to further evaluate. 3. Mild edema in the region of the pancreatic head. No main duct dilatation. Imaging features may be related to pancreatitis. 4. Hepatomegaly with steatosis. 5. Moderate volume intraperitoneal free fluid. 6. Bilateral pleural effusions associated with  bibasilar collapse/consolidative changes. Electronically Signed   By: Kennith Center M.D.   On: 04/03/2020 12:02   MR 3D Recon At Scanner  Result Date: 04/03/2020 CLINICAL DATA:  Cholelithiasis. Known pancreatitis. Patient had difficulty breathing and was unable to complete the last series of the exam. EXAM: MRI ABDOMEN WITHOUT CONTRAST  (INCLUDING MRCP) TECHNIQUE: Multiplanar multisequence MR imaging of the abdomen was performed. Heavily T2-weighted images of the biliary and pancreatic ducts were obtained, and three-dimensional MRCP images were rendered by post processing. COMPARISON:  CT scan 04/02/2020 FINDINGS: Lower chest: Bilateral pleural effusions associated with bibasilar collapse/consolidative changes. Hepatobiliary: Liver measures 20.8 cm craniocaudal length, enlarged. Loss of signal in the liver parenchyma on out of phase T1 imaging suggests fatty deposition. No focal abnormality in the liver parenchyma on this noncontrast motion degraded study. Numerous tiny gallstones evident with apparent gallbladder wall thickening. No intrahepatic biliary duct dilatation. No extrahepatic biliary duct dilatation. Study is markedly limited, but no definite choledocholithiasis. Axial T2 haste imaging demonstrates some central flow artifact in the common duct, but no definite ductal stones. Pancreas: No main duct dilatation. No gross mass lesion in the pancreas. Mild edema seen in the region of the pancreatic head. Spleen:  No splenomegaly. No focal mass lesion. Adrenals/Urinary Tract: No adrenal nodule or mass. No hydronephrosis. No gross renal mass on this noncontrast study. Probable 11 mm cyst interpolar left kidney. Stomach/Bowel: Stomach is unremarkable. No gastric wall thickening. No evidence of outlet obstruction. Duodenum is normally positioned as is the ligament of Treitz. No small bowel or colonic dilatation within the visualized abdomen. Vascular/Lymphatic: No abdominal aortic aneurysm. There is no  gastrohepatic or  hepatoduodenal ligament lymphadenopathy. No retroperitoneal or mesenteric lymphadenopathy. Other:  Moderate volume intraperitoneal free fluid. Musculoskeletal: No suspicious marrow signal abnormality within the visualized bony anatomy. IMPRESSION: 1. Limited study due to motion artifact and lack of intravenous contrast. Patient was unable to complete the entire exam. 2. Cholelithiasis with apparent gallbladder wall thickening. No intrahepatic or extrahepatic biliary duct dilatation. No definite choledocholithiasis. If there is clinical concern for acute cholecystitis, abdominal ultrasound or nuclear scintigraphy may prove helpful to further evaluate. 3. Mild edema in the region of the pancreatic head. No main duct dilatation. Imaging features may be related to pancreatitis. 4. Hepatomegaly with steatosis. 5. Moderate volume intraperitoneal free fluid. 6. Bilateral pleural effusions associated with bibasilar collapse/consolidative changes. Electronically Signed   By: Kennith Center M.D.   On: 04/03/2020 12:02   IR Perc Cholecystostomy  Result Date: 04/08/2020 INDICATION: 39 year old morbidly obese female with acute gallstone pancreatitis an additional clinical concerns for acute calculus cholecystitis given nonvisualization of gallbladder on HIDA scan. Of note, the gallbladder is not distended but is contracted and completely filled with stones. She is not currently a surgical candidate and presents for attempted percutaneous cholecystostomy tube placement. EXAM: IR PARACENTESIS; CHOLECYSTOSTOMY MEDICATIONS: In patient currently receiving intravenous meropenem. No additional antibiotic prophylaxis was administered. ANESTHESIA/SEDATION: Moderate (conscious) sedation was employed during this procedure. A total of Versed 8 mg and Fentanyl 200 mcg was administered intravenously. Moderate Sedation Time: 54 minutes. The patient's level of consciousness and vital signs were monitored continuously by  radiology nursing throughout the procedure under my direct supervision. FLUOROSCOPY TIME:  Fluoroscopy Time: 2 minutes 42 seconds (215 mGy). COMPLICATIONS: None immediate. PROCEDURE: Informed written consent was obtained from the patient after a thorough discussion of the procedural risks, benefits and alternatives. All questions were addressed. Maximal Sterile Barrier Technique was utilized including caps, mask, sterile gowns, sterile gloves, sterile drape, hand hygiene and skin antiseptic. A timeout was performed prior to the initiation of the procedure. The right upper quadrant was interrogated with ultrasound. There has been a significant increase in ascites compared to prior CT imaging. The liver is floating in ascites. Proceeding with placement of a percutaneous cholecystostomy tube in this setting is not ideal given the increased risk for bleeding and bile leak. Therefore, the decision was made to proceed with paracentesis to drain the fluid around the liver. 1% lidocaine with epinephrine was used for local anesthesia. Following this, a 6 Fr Safe-T-Centesis catheter was introduced. An ultrasound image was saved for documentation purposes. The paracentesis was performed. The catheter was removed and a dressing was applied. The patient tolerated the procedure well without immediate post procedural complication. Approximately 4800 mL of bilious ascites was successfully aspirated. Repeat ultrasound imaging demonstrates that the liver is again opposed to the abdominal wall. Therefore, a second skin entry site was selected. Local anesthesia was again attained by infiltration with 1% lidocaine. Under real-time ultrasound guidance, a 21 gauge Accustick needle was advanced along a short transhepatic course and into the stone filled gallbladder. A gentle hand injection of contrast material performed through the needle opacifies the stone filled gallbladder. There are innumerous filling defects throughout the  gallbladder lumen. A 0.018 wire was successfully coiled in the gallbladder fundus. The needle was exchanged for the Accustick transitional sheath which was advanced over the wire and into the gallbladder lumen. The 0.018 wire was then exchanged for a 0.035 Amplatz wire. The skin and transhepatic tract were dilated to 10 [place] and a Cook 10.2 [place] all-purpose drainage catheter was  advanced over the wire and formed in the gallbladder fundus. Gentle contrast injection confirms that the tube is within the gallbladder. No evidence of filling of the cystic duct. The catheter was connected to gravity bag drainage and secured to the skin with 0 Prolene suture. IMPRESSION: 1. Significant interval increase in ascites compared to prior CT and MRI imaging. Paracentesis was required prior to cholecystostomy tube places mint. 2. Successful paracentesis yielding 4.8 L of bilious appearing ascites. 3. Successful placement of transhepatic percutaneous cholecystostomy tube for a diagnosis of acute calculus cholecystitis and gallstone pancreatitis. Signed, Sterling Big, MD, RPVI [facility] Radiology Electronically Signed   By: Malachy Moan M.D.   On: 04/08/2020 16:13   DG CHEST PORT 1 VIEW  Result Date: 04/07/2020 CLINICAL DATA:  Shortness of breath, acute pancreatitis EXAM: PORTABLE CHEST 1 VIEW COMPARISON:  Portable exam 1528 hours compared to 04/06/2020 FINDINGS: Rotated to the LEFT. Normal heart size and mediastinal contours. Significantly decreased lung volumes with bibasilar atelectasis versus infiltrate. Upper lungs clear. No pleural effusion or pneumothorax. Bones unremarkable. IMPRESSION: Significantly decreased lung volumes with bibasilar atelectasis versus infiltrate. Electronically Signed   By: Ulyses Southward M.D.   On: 04/07/2020 15:54   DG CHEST PORT 1 VIEW  Result Date: 04/06/2020 CLINICAL DATA:  Shortness of breath, tachycardia EXAM: PORTABLE CHEST 1  VIEW COMPARISON:  04/04/2020 FINDINGS: Cardiomegaly. Low lung volumes with vascular congestion and bibasilar atelectasis. Suspect small layering effusions. No acute bony abnormality. IMPRESSION: Small bilateral effusions with bibasilar opacities, likely atelectasis. Cardiomegaly, vascular congestion. Electronically Signed   By: Charlett Nose M.D.   On: 04/06/2020 23:41   DG CHEST PORT 1 VIEW  Result Date: 04/04/2020 CLINICAL DATA:  Acute respiratory failure EXAM: PORTABLE CHEST 1 VIEW COMPARISON:  April 03, 2020 FINDINGS: Bilateral pleural effusions with underlying opacities. No other changes. IMPRESSION: Stable bilateral effusions with underlying opacities, favored represent atelectasis. No other change. Recommend follow-up to resolution. Electronically Signed   By: Gerome Sam III M.D   On: 04/04/2020 08:37   DG CHEST PORT 1 VIEW  Result Date: 04/03/2020 CLINICAL DATA:  Hypoxia, shortness of breath EXAM: PORTABLE CHEST 1 VIEW COMPARISON:  04/02/2020 FINDINGS: Examination is significantly limited secondary to poor penetration from patient body habitus, semiupright portable technique, and poor inspiratory effort. Very low lung volumes with accentuation of the heart size. Hazy and streaky bibasilar opacities suggesting small bilateral pleural effusions with bibasilar atelectasis. No pneumothorax. IMPRESSION: Limited examination. Hazy and streaky bibasilar opacities suggesting small bilateral pleural effusions with bibasilar atelectasis. Electronically Signed   By: Duanne Guess D.O.   On: 04/03/2020 13:59   DG Chest Port 1 View  Result Date: 04/02/2020 CLINICAL DATA:  Sepsis EXAM: PORTABLE CHEST 1 VIEW COMPARISON:  None. FINDINGS: Low lung volume with hypoventilatory change. Atelectasis at the right base. Borderline cardiomegaly augmented by low lung volume. No pneumothorax. IMPRESSION: Low lung volume with atelectasis at the right base. Electronically Signed   By: Jasmine Pang M.D.   On: 04/02/2020  20:08   IR EXCHANGE BILIARY DRAIN  Result Date: 04/16/2020 INDICATION: 39 year old female with acute calculus cholecystitis. A percutaneous cholecystostomy tube was placed on 04/08/2020. Her gallbladder is densely packed with stones and the tube could just be formed within the gallbladder fundus. Recent CT imaging demonstrates that the tube is slowly migrating out of the gallbladder. She presents for tube repositioning and exchange. EXAM: Cholecystostomy tube exchange MEDICATIONS: 3.375 g Zosyn; The antibiotic was administered within an  appropriate time frame prior to the initiation of the procedure. ANESTHESIA/SEDATION: Moderate (conscious) sedation was employed during this procedure. A total of Versed 2 mg and Fentanyl 100 mcg was administered intravenously. Moderate Sedation Time: 27 minutes. The patient's level of consciousness and vital signs were monitored continuously by radiology nursing throughout the procedure under my direct supervision. FLUOROSCOPY TIME:  Fluoroscopy Time: 0 minutes 48 seconds (7 mGy). COMPLICATIONS: None immediate. PROCEDURE: Informed written consent was obtained from the patient after a thorough discussion of the procedural risks, benefits and alternatives. All questions were addressed. Maximal Sterile Barrier Technique was utilized including caps, mask, sterile gowns, sterile gloves, sterile drape, hand hygiene and skin antiseptic. A timeout was performed prior to the initiation of the procedure. I hand injection of contrast material through the existing tube demonstrates that the tube remains within the gallbladder lumen. The gallbladder remains packed with stones. The tube was cut and removed over an Amplatz wire. The wire was manipulated in till it extended toward the neck of the gallbladder. A new Cook 10.2 [place] all-purpose drainage catheter was then advanced over the wire and formed in the region of the gallbladder neck. The tube was secured to the skin with 0 Prolene  suture. An image was obtained and stored for the medical record. The patient tolerated the procedure well. IMPRESSION: Successful exchange and reposition for a new 10.2 French percutaneous transhepatic cholecystostomy tube. The tube is now position deeper within the gallbladder lumen nearly at the gallbladder neck. Electronically Signed   By: Malachy Moan M.D.   On: 04/16/2020 15:04   IR IMAGE GUIDED DRAINAGE BY PERCUTANEOUS CATHETER  Result Date: 04/16/2020 INDICATION: 39 year old female with acute calculus cholecystitis and gallstone pancreatitis complicated by intra-abdominal fluid collections. Recent paracentesis performed 04/08/2020 yielded several L of bilious ascites. She now has a large loculated fluid collection in the left hemiabdomen and presents for drain placement. EXAM: Drain placement utilizing ultrasound guidance MEDICATIONS: The patient is currently admitted to the hospital and receiving intravenous antibiotics. The antibiotics were administered within an appropriate time frame prior to the initiation of the procedure. ANESTHESIA/SEDATION: None COMPLICATIONS: None immediate. PROCEDURE: Informed written consent was obtained from the patient after a thorough discussion of the procedural risks, benefits and alternatives. All questions were addressed. Maximal Sterile Barrier Technique was utilized including caps, mask, sterile gowns, sterile gloves, sterile drape, hand hygiene and skin antiseptic. A timeout was performed prior to the initiation of the procedure. The left upper quadrant was interrogated with ultrasound. A large complex fluid collection is identified. Local anesthesia was attained by infiltration with 1% lidocaine. A small dermatotomy was made. Under real-time ultrasound guidance, the fluid collection was accessed with a 15 cm 18 gauge trocar needle. A wire was advanced into the fluid collection. The skin tract was then dilated to 12 [place]. A Cook 12 [place] biliary drainage  catheter was then advanced over the wire and formed. Aspiration yields nearly 1 L of dark green bilious fluid containing debris. The catheter was secured to the skin with 0 Prolene suture. IMPRESSION: Successful placement of a 24 French biliary drainage catheter into the large left abdominal fluid collection. Aspiration yields nearly 1 L of dark green bilious fluid and debris. Electronically Signed   By: Malachy Moan M.D.   On: 04/16/2020 15:06   IR Radiologist Eval & Mgmt  Result Date: 04/30/2020 Please refer to notes tab for details about interventional procedure. (Op Note)  US Abdomen Limited RUQ  Result Date: 04/03/2020 CLINICAL DATA:  Pancreatitis. EXAM: ULTRASOUND ABDOMEN LIMITED RIGHT UPPER QUADRANT COMPARISON:  MRCP 04/03/2020 FINDINGS: The examination is limited due to patient body habitus. Gallbladder: Cholecystolithiasis. Thickened appearance of the gallbladder wall measuring 5 mm. No sonographic Eulah Pont sign is elicited by the scanning technologist. Common bile duct: Diameter: The common duct is dilated measuring 7 mm in diameter. Liver: Generalized increased hepatic parenchymal echogenicity. No definite focal liver lesion is identified. Portal vein is patent on color Doppler imaging with normal direction of blood flow towards the liver. Other: Moderate ascites within the visualized abdomen. IMPRESSION: Limited examination as described. There is cholecystolithiasis. Additionally, there is a thickened appearance of the gallbladder wall measuring 5 mm. Acute cholecystitis cannot be excluded, although gallbladder wall thickening is a nonspecific finding in the setting of abdominal ascites, and a nuclear medicine HIDA scan may be helpful for further assessment. Mild extrahepatic biliary ductal dilatation with the common duct measuring 7 mm in diameter. Hyperechogenicity of the hepatic parenchyma. Findings are consistent with the hepatic steatosis demonstrated on same day MRCP. Moderate ascites  within the visualized abdomen. Electronically Signed   By: Jackey Loge DO   On: 04/03/2020 17:05   IR Paracentesis  Result Date: 04/08/2020 INDICATION: 38 year old morbidly obese female with acute gallstone pancreatitis an additional clinical concerns for acute calculus cholecystitis given nonvisualization of gallbladder on HIDA scan. Of note, the gallbladder is not distended but is contracted and completely filled with stones. She is not currently a surgical candidate and presents for attempted percutaneous cholecystostomy tube placement. EXAM: IR PARACENTESIS; CHOLECYSTOSTOMY MEDICATIONS: In patient currently receiving intravenous meropenem. No additional antibiotic prophylaxis was administered. ANESTHESIA/SEDATION: Moderate (conscious) sedation was employed during this procedure. A total of Versed 8 mg and Fentanyl 200 mcg was administered intravenously. Moderate Sedation Time: 54 minutes. The patient's level of consciousness and vital signs were monitored continuously by radiology nursing throughout the procedure under my direct supervision. FLUOROSCOPY TIME:  Fluoroscopy Time: 2 minutes 42 seconds (215 mGy). COMPLICATIONS: None immediate. PROCEDURE: Informed written consent was obtained from the patient after a thorough discussion of the procedural risks, benefits and alternatives. All questions were addressed. Maximal Sterile Barrier Technique was utilized including caps, mask, sterile gowns, sterile gloves, sterile drape, hand hygiene and skin antiseptic. A timeout was performed prior to the initiation of the procedure. The right upper quadrant was interrogated with ultrasound. There has been a significant increase in ascites compared to prior CT imaging. The liver is floating in ascites. Proceeding with placement of a percutaneous cholecystostomy tube in this setting is not ideal given the increased risk for bleeding and bile leak. Therefore, the decision was made to proceed with paracentesis to drain  the fluid around the liver. 1% lidocaine with epinephrine was used for local anesthesia. Following this, a 6 Fr Safe-T-Centesis catheter was introduced. An ultrasound image was saved for documentation purposes. The paracentesis was performed. The catheter was removed and a dressing was applied. The patient tolerated the procedure well without immediate post procedural complication. Approximately 4800 mL of bilious ascites was successfully aspirated. Repeat ultrasound imaging demonstrates that the liver is again opposed to the abdominal wall. Therefore, a second skin entry site was selected. Local anesthesia was again attained by infiltration with 1% lidocaine. Under real-time ultrasound guidance, a 21 gauge Accustick needle was advanced along a short transhepatic course and into the stone filled gallbladder. A gentle hand injection of contrast material performed through the needle opacifies the stone filled gallbladder. There are innumerous filling defects throughout the gallbladder lumen. A  0.018 wire was successfully coiled in the gallbladder fundus. The needle was exchanged for the Accustick transitional sheath which was advanced over the wire and into the gallbladder lumen. The 0.018 wire was then exchanged for a 0.035 Amplatz wire. The skin and transhepatic tract were dilated to 10 [place] and a Cook 10.2 [place] all-purpose drainage catheter was advanced over the wire and formed in the gallbladder fundus. Gentle contrast injection confirms that the tube is within the gallbladder. No evidence of filling of the cystic duct. The catheter was connected to gravity bag drainage and secured to the skin with 0 Prolene suture. IMPRESSION: 1. Significant interval increase in ascites compared to prior CT and MRI imaging. Paracentesis was required prior to cholecystostomy tube places mint. 2. Successful paracentesis yielding 4.8 L of bilious appearing ascites. 3. Successful placement of transhepatic percutaneous  cholecystostomy tube for a diagnosis of acute calculus cholecystitis and gallstone pancreatitis. Signed, Sterling Big, MD, RPVI [facility] Radiology Electronically Signed   By: Malachy Moan M.D.   On: 04/08/2020 16:13    Labs:  CBC: Recent Labs    04/17/20 0352 04/18/20 0443 04/19/20 0351 05/01/20 1416  WBC 13.3* 12.9* 11.0* 12.2*  HGB 11.1* 11.8* 11.1* 11.4*  HCT 36.8 39.3 36.9 38.1  PLT 221 246 231 507*    COAGS: Recent Labs    04/02/20 1847 04/08/20 0132  INR  --  1.2  APTT 26  --     BMP: Recent Labs    04/14/20 1158 04/16/20 0332 04/17/20 0352 04/18/20 0443  NA 139 140 140 139  K 4.2 3.9 4.1 4.4  CL 104 104 104 105  CO2 26 27 26 24   GLUCOSE 126* 102* 94 97  BUN 10 11 9 10   CALCIUM 8.3* 8.4* 8.5* 8.5*  CREATININE 0.70 0.64 0.62 0.61  GFRNONAA >60 >60 >60 >60  GFRAA >60 >60 >60 >60    LIVER FUNCTION TESTS: Recent Labs    04/14/20 1158 04/16/20 0332 04/17/20 0352 04/18/20 0443  BILITOT 0.8 0.8 0.8 0.8  AST 29 23 22 21   ALT 25 21 19 19   ALKPHOS 83 67 63 61  PROT 5.5* 5.4* 5.2* 5.8*  ALBUMIN 2.3* 2.3* 2.3* 2.7*    TUMOR MARKERS: No results for input(s): AFPTM, CEA, CA199, CHROMGRNA in the last 8760 hours.  Assessment and Plan:  Right abdominal fluid collection: Verlon Au A. Gayle, 39 year old female, is a patient familiar to IR and currently has  cholecystostomy and left abdominal drains in place. Follow-up imaging at the clinic 04/30/20 demonstrated a new sub-hepatic fluid collection. She presents today for an image-guided right abdominal fluid collection aspiration with possible drain placement. This procedure was approved by Dr. Miles Costain.   Risks and benefits discussed with the patient including bleeding, infection, damage to adjacent structures, bowel perforation/fistula connection, and sepsis.  All of the patient's questions were answered, patient is agreeable to proceed.  The patient  has been NPO. Labs and vitals have been reviewed.   Consent signed and in chart.  Thank you for this interesting consult.  I greatly enjoyed meeting JINAN BIGGINS and look forward to participating in their care.  A copy of this report was sent to the requesting provider on this date.  Electronically Signed: Alwyn Ren, AGACNP-BC [phone] 05/01/2020, 2:43 PM   I spent a total of  25 Minutes in face to face in clinical consultation, greater than 50% of which was counseling/coordinating care for an image-guided right abdominal fluid  collection aspiration with possible drain placement.

## 2020-05-01 NOTE — Procedures (Signed)
  Procedure: Exchange/upsize GB drain; New RLQ perc abscess drain  Sample for GS, C&S EBL:   minimal Complications:  none immediate  See full dictation in [employer].  Thora Lance MD Main # [phone] Pager  [phone]

## 2020-05-01 NOTE — Discharge Instructions (Signed)
Surgical Drain Home Care Surgical drains are used to remove extra fluid that normally builds up in a surgical wound after surgery. A surgical drain helps to heal a surgical wound. Different kinds of surgical drains include:  Active drains. These drains use suction to pull drainage away from the surgical wound. Drainage flows through a tube to a container outside of the body. With these drains, you need to keep the bulb or the drainage container flat (compressed) at all times, except while you empty it. Flattening the bulb or container creates suction.  Passive drains. These drains allow fluid to drain naturally, by gravity. Drainage flows through a tube to a bandage (dressing) or a container outside of the body. Passive drains do not need to be emptied. A drain is placed during surgery. Right after surgery, drainage is usually bright red and a little thicker than water. The drainage may gradually turn yellow or pink and become thinner. It is likely that your health care provider will remove the drain when the drainage stops or when the amount decreases to 1-2 Tbsp (15-30 mL) during a 24-hour period. Supplies needed:  Tape.  Germ-free cleaning solution (sterile saline).  Cotton swabs.  Split gauze drain sponge: 4 x 4 inches (10 x 10 cm).  Gauze square: 4 x 4 inches (10 x 10 cm). How to care for your surgical drain Care for your drain as told by your health care provider. This is important to help prevent infection. If your drain is placed at your back, or any other hard-to-reach area, ask another person to assist you in performing the following tasks: General care  Keep the skin around the drain dry and covered with a dressing at all times.  Check your drain area every day for signs of infection. Check for: ? Redness, swelling, or pain. ? Pus or a bad smell. ? Cloudy drainage. ? Tenderness or pressure at the drain exit site. Changing the dressing Follow instructions from your health care  provider about how to change your dressing. Change your dressing at least once a day. Change it more often if needed to keep the dressing dry. Make sure you: 1. Gather your supplies. 2. Wash your hands with soap and water before you change your dressing. If soap and water are not available, use hand sanitizer. 3. Remove the old dressing. Avoid using scissors to do that. 4. Wash your hands with soap and water again after removing the old dressing. 5. Use sterile saline to clean your skin around the drain. You may need to use a cotton swab to clean the skin. 6. Place the tube through the slit in a drain sponge. Place the drain sponge so that it covers your wound. 7. Place the gauze square or another drain sponge on top of the drain sponge that is on the wound. Make sure the tube is between those layers. 8. Tape the dressing to your skin. 9. Tape the drainage tube to your skin 1-2 inches (2.5-5 cm) below the place where the tube enters your body. Taping keeps the tube from pulling on any stitches (sutures) that you have. 10. Wash your hands with soap and water. 11. Write down the color of your drainage and how often you change your dressing. How to empty your active drain  1. Make sure that you have a measuring cup that you can empty your drainage into. 2. Wash your hands with soap and water. If soap and water are not available, use hand sanitizer. 3.   Loosen any pins or clips that hold the tube in place. 4. If your health care provider tells you to strip the tube to prevent clots and tube blockages: ? Hold the tube at the skin with one hand. Use your other hand to pinch the tubing with your thumb and first finger. ? Gently move your fingers down the tube while squeezing very lightly. This clears any drainage, clots, or tissue from the tube. ? You may need to do this several times each day to keep the tube clear. Do not pull on the tube. 5. Open the bulb cap or the drain plug. Do not touch the  inside of the cap or the bottom of the plug. 6. Turn the device upside down and gently squeeze. 7. Empty all of the drainage into the measuring cup. 8. Compress the bulb or the container and replace the cap or the plug. To compress the bulb or the container, squeeze it firmly in the middle while you close the cap or plug the container. 9. Write down the amount of drainage that you have in each 24-hour period. If you have less than 2 Tbsp (30 mL) of drainage during 24 hours, contact your health care provider. 10. Flush the drainage down the toilet. 11. Wash your hands with soap and water. Contact a health care provider if:  You have redness, swelling, or pain around your drain area.  You have pus or a bad smell coming from your drain area.  You have a fever or chills.  The skin around your drain is warm to the touch.  The amount of drainage that you have is increasing instead of decreasing.  You have drainage that is cloudy.  There is a sudden stop or a sudden decrease in the amount of drainage that you have.  Your drain tube falls out.  Your active drain does not stay compressed after you empty it. Summary  Surgical drains are used to remove extra fluid that normally builds up in a surgical wound after surgery.  Different kinds of surgical drains include active drains and passive drains. Active drains use suction to pull drainage away from the surgical wound, and passive drains allow fluid to drain naturally.  It is important to care for your drain to prevent infection. If your drain is placed at your back, or any other hard-to-reach area, ask another person to assist you.  Contact your health care provider if you have redness, swelling, or pain around your drain area. This information is not intended to replace advice given to you by your health care provider. Make sure you discuss any questions you have with your health care provider. Document Revised: 10/31/2018 Document  Reviewed: 10/31/2018 [employer] Patient Education  2020 [employer]  

## 2020-05-06 LAB — AEROBIC/ANAEROBIC CULTURE W GRAM STAIN (SURGICAL/DEEP WOUND): Special Requests: NORMAL

## 2020-05-13 DIAGNOSIS — K859 Acute pancreatitis without necrosis or infection, unspecified: Secondary | ICD-10-CM | POA: Diagnosis not present

## 2020-05-13 DIAGNOSIS — K811 Chronic cholecystitis: Secondary | ICD-10-CM | POA: Diagnosis not present

## 2020-05-14 ENCOUNTER — Ambulatory Visit (HOSPITAL_COMMUNITY)
Admission: RE | Admit: 2020-05-14 | Discharge: 2020-05-14 | Disposition: A | Discharge status: Home / Self Care | Payer: BC Managed Care – PPO | Source: Ambulatory Visit | Attending: Student | Admitting: Student

## 2020-05-14 ENCOUNTER — Other Ambulatory Visit (HOSPITAL_COMMUNITY): Payer: Self-pay | Admitting: Student

## 2020-05-14 ENCOUNTER — Other Ambulatory Visit: Payer: Self-pay

## 2020-05-14 DIAGNOSIS — K810 Acute cholecystitis: Secondary | ICD-10-CM | POA: Diagnosis not present

## 2020-05-14 DIAGNOSIS — Z4803 Encounter for change or removal of drains: Secondary | ICD-10-CM | POA: Diagnosis not present

## 2020-05-14 DIAGNOSIS — R161 Splenomegaly, not elsewhere classified: Secondary | ICD-10-CM | POA: Diagnosis not present

## 2020-05-14 HISTORY — PX: IR EXCHANGE BILIARY DRAIN: IMG6046

## 2020-05-14 HISTORY — PX: IR CATHETER TUBE CHANGE: IMG717

## 2020-05-14 HISTORY — PX: IR SINUS/FIST TUBE CHK-NON GI: IMG673

## 2020-05-14 MED ORDER — IOHEXOL 300 MG/ML  SOLN
50.0000 mL | Freq: Once | INTRAMUSCULAR | Status: AC | PRN
  Administered 2020-05-14: 15 mL

## 2020-05-14 MED ORDER — LIDOCAINE HCL 1 % IJ SOLN
INTRAMUSCULAR | Status: AC
  Filled 2020-05-14: qty 20

## 2020-05-15 ENCOUNTER — Other Ambulatory Visit: Payer: Self-pay | Admitting: Interventional Radiology

## 2020-05-15 ENCOUNTER — Other Ambulatory Visit: Payer: Self-pay | Admitting: Student

## 2020-05-15 ENCOUNTER — Other Ambulatory Visit: Payer: Self-pay | Admitting: Hematology & Oncology

## 2020-05-18 ENCOUNTER — Other Ambulatory Visit: Payer: Self-pay | Admitting: Surgery

## 2020-05-18 DIAGNOSIS — K819 Cholecystitis, unspecified: Secondary | ICD-10-CM

## 2020-05-28 ENCOUNTER — Ambulatory Visit
Admission: RE | Admit: 2020-05-28 | Discharge: 2020-05-28 | Disposition: A | Discharge status: Home / Self Care | Payer: BC Managed Care – PPO | Source: Ambulatory Visit | Attending: Surgery | Admitting: Surgery

## 2020-05-28 ENCOUNTER — Other Ambulatory Visit: Payer: Self-pay

## 2020-05-28 ENCOUNTER — Ambulatory Visit (HOSPITAL_COMMUNITY)
Admission: RE | Admit: 2020-05-28 | Discharge: 2020-05-28 | Disposition: A | Discharge status: Home / Self Care | Payer: BC Managed Care – PPO | Source: Ambulatory Visit | Attending: Surgery | Admitting: Surgery

## 2020-05-28 ENCOUNTER — Other Ambulatory Visit: Payer: BC Managed Care – PPO

## 2020-05-28 ENCOUNTER — Other Ambulatory Visit (HOSPITAL_COMMUNITY): Payer: Self-pay | Admitting: Surgery

## 2020-05-28 DIAGNOSIS — K819 Cholecystitis, unspecified: Secondary | ICD-10-CM | POA: Insufficient documentation

## 2020-05-28 DIAGNOSIS — R188 Other ascites: Secondary | ICD-10-CM | POA: Diagnosis not present

## 2020-05-28 MED ORDER — IOHEXOL 300 MG/ML  SOLN
100.0000 mL | Freq: Once | INTRAMUSCULAR | Status: AC | PRN
  Administered 2020-05-28: 100 mL via INTRAVENOUS

## 2020-05-28 NOTE — Progress Notes (Signed)
Referring Physician(s): Martin,Matthew  Chief Complaint: The patient is seen in follow up today s/p acute calculous cholecystitis, pancreatitis, peri-splenic abscess, intra-abdominal abscess  History of present illness: Amber May is a 39 y.o. female who was admitted 04/02/20 with acute gallstone pancreatitis as well as acute calculus cholecystitis who was determined to be a poor surgical candidate.  She underwent percutaneous cholecystostomy tube placement 6/30 by Dr. Archer Asa.  One week later she was found to have a perisplenic collection.  On 7/8, she underwent chole tube exchange and LUQ drain placement.  On 7/2, she presented to drain clinic for reassessment.  She was found to have a large RLQ fluid collection and underwent RLQ drain (#3) placement the following day, 7/23.  On subsequent follow-up appointment scheduled at [facility] 8/5, her perc chole tube and RLQ drainage catheter were replaced.  The RLQ drain was upsized.  Her LUQ drain was removed at that time due to resolve of collection.  She now presents to IR drain clinic today for routine follow-up.   Amber May states she has been flushing her drains regularly.  She states the abscess drain tubing has been clogged and it is not moving into the bulb.  She has significant drainage from the superior/anterior tube site.  She denies fever, chills, nausea, vomiting, abdominal pain.  She is eating and drinking well.       Past Medical History:  Diagnosis Date  . Morbid obesity with BMI of 50.0-59.9, adult (HCC) 04/05/2020  . Obesity   . Thyroid disease     Past Surgical History:  Procedure Laterality Date  . IR CATHETER TUBE CHANGE  05/14/2020  . IR EXCHANGE BILIARY DRAIN  04/16/2020  . IR EXCHANGE BILIARY DRAIN  05/01/2020  . IR EXCHANGE BILIARY DRAIN  05/14/2020  . IR GUIDED DRAIN W CATHETER PLACEMENT  05/01/2020  . IR PARACENTESIS  04/08/2020  . IR PERC CHOLECYSTOSTOMY  04/08/2020  . IR RADIOLOGIST EVAL & MGMT  04/30/2020  . IR  SINUS/FIST TUBE CHK-NON GI  05/14/2020    Allergies: Patient has no known allergies.  Medications: Prior to Admission medications   Medication Sig Start Date End Date Taking? Authorizing Provider  levothyroxine (SYNTHROID) 125 MCG tablet Take 125 mcg by mouth daily before breakfast.    [provider]  oxyCODONE-acetaminophen (PERCOCET/ROXICET) 5-325 MG tablet Take 1 tablet by mouth every 4 (four) hours as needed for severe pain.    [provider]  polyethylene glycol (MIRALAX / GLYCOLAX) 17 g packet Take 17 g by mouth daily. 04/20/20   Tyrone Nine, MD  senna (SENOKOT) 8.6 MG TABS tablet Take 1 tablet (8.6 mg total) by mouth daily. 04/19/20   Tyrone Nine, MD     Family History  Problem Relation Age of Onset  . Diabetes Mellitus II Maternal Grandmother     Social History   Socioeconomic History  . Marital status: Single    Spouse name: Not on file  . Number of children: Not on file  . Years of education: Not on file  . Highest education level: Not on file  Occupational History  . Not on file  Tobacco Use  . Smoking status: Never Smoker  . Smokeless tobacco: Never Used  Substance and Sexual Activity  . Alcohol use: Not Currently  . Drug use: Not Currently  . Sexual activity: Not on file  Other Topics Concern  . Not on file  Social History Narrative  . Not on file   Social  Determinants of Health   Financial Resource Strain:   . Difficulty of Paying Living Expenses: Not on file  Food Insecurity:   . Worried About Programme researcher, broadcasting/film/video in the Last Year: Not on file  . Ran Out of Food in the Last Year: Not on file  Transportation Needs:   . Lack of Transportation (Medical): Not on file  . Lack of Transportation (Non-Medical): Not on file  Physical Activity:   . Days of Exercise per Week: Not on file  . Minutes of Exercise per Session: Not on file  Stress:   . Feeling of Stress : Not on file  Social Connections:   . Frequency of Communication with  Friends and Family: Not on file  . Frequency of Social Gatherings with Friends and Family: Not on file  . Attends Religious Services: Not on file  . Active Member of Clubs or Organizations: Not on file  . Attends Banker Meetings: Not on file  . Marital Status: Not on file     Vital Signs: BP (!) 145/80   Pulse 94   Temp 98 F (36.7 C)   SpO2 94%   Physical Exam  NAD, alert Abdomen: RUQ (superior/anterior) drain in place with significant leakage around the skin.  This is connected to gravity drainage bag with cloudy, thick, yellowish drainage.  Right lateral/inferior drain insertion site intact.  Does not appear to be leaking. Connection tubing clogged with immediate release of purulent drainage when tubing disconnected from hub.   Imaging: No results found.  Labs:  CBC: Recent Labs    04/17/20 0352 04/18/20 0443 04/19/20 0351 05/01/20 1416  WBC 13.3* 12.9* 11.0* 12.2*  HGB 11.1* 11.8* 11.1* 11.4*  HCT 36.8 39.3 36.9 38.1  PLT 221 246 231 507*    COAGS: Recent Labs    04/02/20 1847 04/08/20 0132 05/01/20 1416  INR  --  1.2 1.1  APTT 26  --   --     BMP: Recent Labs    04/14/20 1158 04/16/20 0332 04/17/20 0352 04/18/20 0443  NA 139 140 140 139  K 4.2 3.9 4.1 4.4  CL 104 104 104 105  CO2 26 27 26 24   GLUCOSE 126* 102* 94 97  BUN 10 11 9 10   CALCIUM 8.3* 8.4* 8.5* 8.5*  CREATININE 0.70 0.64 0.62 0.61  GFRNONAA >60 >60 >60 >60  GFRAA >60 >60 >60 >60    LIVER FUNCTION TESTS: Recent Labs    04/14/20 1158 04/16/20 0332 04/17/20 0352 04/18/20 0443  BILITOT 0.8 0.8 0.8 0.8  AST 29 23 22 21   ALT 25 21 19 19   ALKPHOS 83 67 63 61  PROT 5.5* 5.4* 5.2* 5.8*  ALBUMIN 2.3* 2.3* 2.3* 2.7*    Assessment: Gallstone pancreatitis and acute calculus cholecystitis s/p percutaneous cholecystostomy tube placement 04/08/20, last exchanged 05/14/20 LLQ fluid collection s/p drain placement 05/01/20, subsequently resolved and removed Right lateral  fluid collection s/p drainage 05/01/20 with aspiration and upsize 05/14/20 Patient presents today for follow-up of her remaining perc chole drain and right lateral drains.  The perc chole tube flushes easily but does not aspirate.  There is significant drainage from around the insertion site of thick, yellow output.  The skin suture is no longer in place and stat lock is dislodged due to wet, goopy drainage saturating the skin.  The right lateral drain is intact, however the tubing connecting the hub and the bulb has become clogged.  Release of the drain results in  immediate drainage.  Attempted to irrigate and aspiration drainage, however mostly return of air and approx 40 mL of purulent, thick drainage.  CT Abdomen Pelvis reviewed by Dr. Miles Costain. Perc chole may be slight retracted. The right lateral collection is largely unchanged.  Plan to schedule for both drains to be exchanged at either [facility] or [facility].  Patient appears to tolerate drain manipulation without issue.  Perc chole has been in place since June. Most recent drain exchanges completed without sedation.  Patient given appointment information- she is to present to [facility] tomorrow.    Signed: Hoyt Koch, PA 05/28/2020, 4:24 PM   Please refer to Dr. Miles Costain attestation of this note for management and plan.

## 2020-05-29 ENCOUNTER — Ambulatory Visit (HOSPITAL_COMMUNITY): Admission: RE | Admit: 2020-05-29 | Payer: BC Managed Care – PPO | Source: Ambulatory Visit

## 2020-06-04 ENCOUNTER — Ambulatory Visit (HOSPITAL_COMMUNITY)
Admission: RE | Admit: 2020-06-04 | Discharge: 2020-06-04 | Disposition: A | Discharge status: Home / Self Care | Payer: BC Managed Care – PPO | Source: Ambulatory Visit | Attending: Surgery | Admitting: Surgery

## 2020-06-04 ENCOUNTER — Other Ambulatory Visit: Payer: Self-pay

## 2020-06-04 ENCOUNTER — Other Ambulatory Visit (HOSPITAL_COMMUNITY): Payer: Self-pay | Admitting: Surgery

## 2020-06-04 DIAGNOSIS — K651 Peritoneal abscess: Secondary | ICD-10-CM | POA: Diagnosis not present

## 2020-06-04 DIAGNOSIS — K819 Cholecystitis, unspecified: Secondary | ICD-10-CM | POA: Diagnosis not present

## 2020-06-04 DIAGNOSIS — Z4803 Encounter for change or removal of drains: Secondary | ICD-10-CM | POA: Diagnosis not present

## 2020-06-04 DIAGNOSIS — Z434 Encounter for attention to other artificial openings of digestive tract: Secondary | ICD-10-CM | POA: Diagnosis not present

## 2020-06-04 HISTORY — PX: IR CATHETER TUBE CHANGE: IMG717

## 2020-06-04 HISTORY — PX: IR EXCHANGE BILIARY DRAIN: IMG6046

## 2020-06-04 MED ORDER — LIDOCAINE HCL (PF) 1 % IJ SOLN
INTRAMUSCULAR | Status: AC | PRN
  Administered 2020-06-04: 10 mL

## 2020-06-04 MED ORDER — LIDOCAINE HCL 1 % IJ SOLN
INTRAMUSCULAR | Status: AC
  Filled 2020-06-04: qty 20

## 2020-06-04 MED ORDER — IOHEXOL 300 MG/ML  SOLN
50.0000 mL | Freq: Once | INTRAMUSCULAR | Status: AC | PRN
  Administered 2020-06-04: 15 mL

## 2020-06-04 NOTE — Procedures (Signed)
Interventional Radiology Procedure Note  Procedure:   Routine exchange of perc chole, with new 6F drain.  Routine exchange of abscess drain of 76F drain.    Complications: None  Recommendations:  - Perc chole to gravity - Abscess to bulb - Do not submerge - Routine care   Signed,  Yvone Neu. Loreta Ave, DO

## 2020-06-19 DIAGNOSIS — K8020 Calculus of gallbladder without cholecystitis without obstruction: Secondary | ICD-10-CM | POA: Diagnosis not present

## 2020-06-19 DIAGNOSIS — K811 Chronic cholecystitis: Secondary | ICD-10-CM | POA: Diagnosis not present

## 2020-06-30 ENCOUNTER — Other Ambulatory Visit (HOSPITAL_COMMUNITY)
Admission: RE | Admit: 2020-06-30 | Discharge: 2020-06-30 | Disposition: A | Discharge status: Home / Self Care | Payer: BC Managed Care – PPO | Source: Ambulatory Visit | Attending: Surgery | Admitting: Surgery

## 2020-06-30 DIAGNOSIS — Z01812 Encounter for preprocedural laboratory examination: Secondary | ICD-10-CM | POA: Insufficient documentation

## 2020-06-30 DIAGNOSIS — Z20822 Contact with and (suspected) exposure to covid-19: Secondary | ICD-10-CM | POA: Diagnosis not present

## 2020-06-30 LAB — SARS CORONAVIRUS 2 (TAT 6-24 HRS): SARS Coronavirus 2: NEGATIVE

## 2020-06-30 NOTE — Patient Instructions (Addendum)
DUE TO COVID-19 ONLY ONE VISITOR IS ALLOWED TO COME WITH YOU AND STAY IN THE WAITING ROOM ONLY DURING PRE OP AND PROCEDURE DAY OF SURGERY. THE 1 VISITOR  MAY VISIT WITH YOU AFTER SURGERY IN YOUR PRIVATE ROOM DURING VISITING HOURS ONLY!   ONCE YOUR COVID TEST IS COMPLETED,  PLEASE BEGIN THE QUARANTINE INSTRUCTIONS AS OUTLINED IN YOUR HANDOUT.                Amber May    Your procedure is scheduled on: 07/03/20   Report to [facility] Main  Entrance   Report to admitting at   9:00 AM     Call this number if you have problems the morning of surgery [phone]    BRUSH YOUR TEETH MORNING OF SURGERY AND RINSE YOUR MOUTH OUT, NO CHEWING GUM CANDY OR MINTS.  No food after midnight.    You may have clear liquid until 8:00 AM.    At 8:00  AM drink pre surgery drink  . Nothing by mouth after 8:00  AM.    Take these medicines the morning of surgery with A SIP OF WATER:  Levothyroxine                                You may not have any metal on your body including hair pins and              piercings  Do not wear jewelry, make-up, lotions, powders or perfumes, deodorant             Do not wear nail polish on your fingernails.  Do not shave  48 hours prior to surgery.               Do not bring valuables to the hospital. [facility] IS NOT             RESPONSIBLE   FOR VALUABLES.  Contacts, dentures or bridgework may not be worn into surgery.       Patients discharged the day of surgery will not be allowed to drive home.   IF YOU ARE HAVING SURGERY AND GOING HOME THE SAME DAY, YOU MUST HAVE AN ADULT TO DRIVE YOU HOME AND BE WITH YOU FOR 24 HOURS.   YOU MAY GO HOME BY TAXI OR UBER OR ORTHERWISE, BUT AN ADULT MUST ACCOMPANY YOU HOME AND STAY WITH YOU FOR 24 HOURS.  Name and phone number of your driver:  Special Instructions: N/A              Please read over the following fact sheets you were  given: _____________________________________________________________________             [facility] - Preparing for Surgery  Before surgery, you can play an important role.   Because skin is not sterile, your skin needs to be as free of germs as possible.   You can reduce the number of germs on your skin by washing with CHG (chlorahexidine gluconate) soap before surgery.   CHG is an antiseptic cleaner which kills germs and bonds with the skin to continue killing germs even after washing. Please DO NOT use if you have an allergy to CHG or antibacterial soaps.   If your skin becomes reddened/irritated stop using the CHG and inform your nurse when you arrive at Short Stay. Do not shave (including legs and underarms) for at least 48 hours prior to  the first CHG shower. Please follow these instructions carefully:  1.  Shower with CHG Soap the night before surgery and the  morning of Surgery.  2.  If you choose to wash your hair, wash your hair first as usual with your  normal  shampoo.  3.  After you shampoo, rinse your hair and body thoroughly to remove the  shampoo.                                        4.  Use CHG as you would any other liquid soap.  You can apply chg directly  to the skin and wash                       Gently with a scrungie or clean washcloth.  5.  Apply the CHG Soap to your body ONLY FROM THE NECK DOWN.   Do not use on face/ open                           Wound or open sores. Avoid contact with eyes, ears mouth and genitals (private parts).                       Wash face,  Genitals (private parts) with your normal soap.             6.  Wash thoroughly, paying special attention to the area where your surgery  will be performed.  7.  Thoroughly rinse your body with warm water from the neck down.  8.  DO NOT shower/wash with your normal soap after using and rinsing off  the CHG Soap.             9.  Pat yourself dry with a clean towel.            10.  Wear clean  pajamas.            11.  Place clean sheets on your bed the night of your first shower and do not  sleep with pets. Day of Surgery : Do not apply any lotions/deodorants the morning of surgery.  Please wear clean clothes to the hospital/surgery center.  FAILURE TO FOLLOW THESE INSTRUCTIONS MAY RESULT IN THE CANCELLATION OF YOUR SURGERY PATIENT SIGNATURE_________________________________  NURSE SIGNATURE__________________________________  ________________________________________________________________________

## 2020-07-01 ENCOUNTER — Other Ambulatory Visit: Payer: Self-pay

## 2020-07-01 ENCOUNTER — Encounter (HOSPITAL_COMMUNITY): Payer: Self-pay

## 2020-07-01 ENCOUNTER — Encounter (HOSPITAL_COMMUNITY)
Admission: RE | Admit: 2020-07-01 | Discharge: 2020-07-01 | Disposition: A | Discharge status: Home / Self Care | Payer: BC Managed Care – PPO | Source: Ambulatory Visit | Attending: Surgery | Admitting: Surgery

## 2020-07-01 DIAGNOSIS — Z01812 Encounter for preprocedural laboratory examination: Secondary | ICD-10-CM | POA: Insufficient documentation

## 2020-07-01 LAB — CBC
HCT: 39.6 % (ref 36.0–46.0)
Hemoglobin: 11.9 g/dL — ABNORMAL LOW (ref 12.0–15.0)
MCH: 24.8 pg — ABNORMAL LOW (ref 26.0–34.0)
MCHC: 30.1 g/dL (ref 30.0–36.0)
MCV: 82.7 fL (ref 80.0–100.0)
Platelets: 307 10*3/uL (ref 150–400)
RBC: 4.79 MIL/uL (ref 3.87–5.11)
RDW: 15.1 % (ref 11.5–15.5)
WBC: 9.6 10*3/uL (ref 4.0–10.5)
nRBC: 0 % (ref 0.0–0.2)

## 2020-07-01 LAB — BASIC METABOLIC PANEL
Anion gap: 9 (ref 5–15)
BUN: 10 mg/dL (ref 6–20)
CO2: 23 mmol/L (ref 22–32)
Calcium: 9.1 mg/dL (ref 8.9–10.3)
Chloride: 109 mmol/L (ref 98–111)
Creatinine, Ser: 0.59 mg/dL (ref 0.44–1.00)
GFR calc Af Amer: >60 mL/min (ref >60)
GFR calc non Af Amer: >60 mL/min (ref >60)
Glucose, Bld: 93 mg/dL (ref 70–99)
Potassium: 4.1 mmol/L (ref 3.5–5.1)
Sodium: 141 mmol/L (ref 135–145)

## 2020-07-01 NOTE — Progress Notes (Signed)
COVID Vaccine Completed:yes Date COVID Vaccine completed:01/23/20 COVID vaccine manufacturer: Pfizer      PCP - Dr Gayla Medicus Cardiologist - no  Chest x-ray - no EKG - 04/03/20-Epic Stress Test - no ECHO - no Cardiac Cath - no Pacemaker/ICD device last checked:NA  Sleep Study - no CPAP -   Fasting Blood Sugar - NA Checks Blood Sugar _____ times a day  Blood Thinner Instructions:NA Aspirin Instructions: Last Dose:  Anesthesia review:   Patient denies shortness of breath, fever, cough and chest pain at PAT appointment Yes  Patient verbalized understanding of instructions that were given to them at the PAT appointment. Patient was also instructed that they will need to review over the PAT instructions again at home before surgery. Yes  Pt has a BMI of 51.7 she gets SOB climbing stairs but none doing housework or with ADLs.  Most of her weight is in the hips and legs. She has full ROM with her neck and can open her mouth 3 finger breadths. She has never had general anesthesia.

## 2020-07-01 NOTE — H&P (Signed)
Chief Complaint:  History of pancreatitis and cholecystitis.  Early attempt for a laparoscopic cholecystectomy aborted and percutaneous drainage achieved  History of Present Illness:  Amber May is an 39 y.o. female who was admitted with pancreatitis and gallstones and was slated for laparoscopic cholecystectomy but was felt to sit undergo general anesthesia.  2 drains were placed and she's been managed as an outpatient.  She is ready to get the drains of her gallbladder removed.  I discussed laparoscopic as well as open cholecystectomy with her and hopefully we'll be able to get her drain to get her gallbladder removed.  She is aware of the risk which are increased.  She was seen in the office and informed consent was obtained.  Past Medical History:  Diagnosis Date  . Hypothyroidism   . Morbid obesity with BMI of 50.0-59.9, adult (HCC) 04/05/2020  . Obesity   . Thyroid disease     Past Surgical History:  Procedure Laterality Date  . IR CATHETER TUBE CHANGE  05/14/2020  . IR CATHETER TUBE CHANGE  06/04/2020  . IR EXCHANGE BILIARY DRAIN  04/16/2020  . IR EXCHANGE BILIARY DRAIN  05/01/2020  . IR EXCHANGE BILIARY DRAIN  05/14/2020  . IR EXCHANGE BILIARY DRAIN  06/04/2020  . IR GUIDED DRAIN W CATHETER PLACEMENT  05/01/2020  . IR PARACENTESIS  04/08/2020  . IR PERC CHOLECYSTOSTOMY  04/08/2020  . IR RADIOLOGIST EVAL & MGMT  04/30/2020  . IR SINUS/FIST TUBE CHK-NON GI  05/14/2020    No current facility-administered medications for this encounter.   Current Outpatient Medications  Medication Sig Dispense Refill  . acetaminophen (TYLENOL) 500 MG tablet Take 500 mg by mouth every 6 (six) hours as needed for mild pain or moderate pain.    Marland Kitchen levothyroxine (SYNTHROID) 125 MCG tablet Take 125 mcg by mouth daily before breakfast.    . polyethylene glycol (MIRALAX / GLYCOLAX) 17 g packet Take 17 g by mouth daily. (Patient not taking: Reported on 06/29/2020) 10 each 0  . senna (SENOKOT) 8.6 MG TABS tablet Take  1 tablet (8.6 mg total) by mouth daily. (Patient not taking: Reported on 06/29/2020) 10 tablet 0   Patient has no known allergies. Family History  Problem Relation Age of Onset  . Diabetes Mellitus II Maternal Grandmother    Social History:   reports that she has never smoked. She has never used smokeless tobacco. She reports previous alcohol use. She reports previous drug use.   REVIEW OF SYSTEMS : Negative except for see her problem list.  Physical Exam:   Last menstrual period 06/08/2020. There is no height or weight on file to calculate BMI.  Gen:  WDWN white female NAD  Neurological: Alert and oriented to person, place, and time. Motor and sensory function is grossly intact  Head: Normocephalic and atraumatic.  Eyes: Conjunctivae are normal. Pupils are equal, round, and reactive to light. No scleral icterus.  Neck: Normal range of motion. Neck supple. No tracheal deviation or thyromegaly present.  Cardiovascular:  SR without murmurs or gallops.  No carotid bruits Breast:  Not examined Respiratory: Effort normal.  No respiratory distress. No chest wall tenderness. Breath sounds normal.  No wheezes, rales or rhonchi.  Abdomen:  Obese with 2 drains in the right upper quadrant GU:  Not examined Musculoskeletal: Normal range of motion. Extremities are nontender. No cyanosis, edema or clubbing noted Lymphadenopathy: No cervical, preauricular, postauricular or axillary adenopathy is present Skin: Skin is warm and dry. No rash noted. No  diaphoresis. No erythema. No pallor. Pscyh: Normal mood and affect. Behavior is normal. Judgment and thought content normal.   LABORATORY RESULTS: Results for orders placed or performed during the hospital encounter of 07/01/20 (from the past 48 hour(s))  Basic metabolic panel per protocol     Status: None   Collection Time: 07/01/20  8:26 AM  Result Value Ref Range   Sodium 141 135 - 145 mmol/L   Potassium 4.1 3.5 - 5.1 mmol/L   Chloride 109 98 -  111 mmol/L   CO2 23 22 - 32 mmol/L   Glucose, Bld 93 70 - 99 mg/dL    Comment: Glucose reference range applies only to samples taken after fasting for at least 8 hours.   BUN 10 6 - 20 mg/dL   Creatinine, Ser 3.23 0.44 - 1.00 mg/dL   Calcium 9.1 8.9 - 55.7 mg/dL   GFR calc non Af Amer >60 >60 mL/min   GFR calc Af Amer >60 >60 mL/min   Anion gap 9 5 - 15    Comment: Performed at [facility], [address] [place], [place] [address]  CBC per protocol     Status: Abnormal   Collection Time: 07/01/20  8:26 AM  Result Value Ref Range   WBC 9.6 4.0 - 10.5 K/uL   RBC 4.79 3.87 - 5.11 MIL/uL   Hemoglobin 11.9 (L) 12.0 - 15.0 g/dL   HCT 54.2 36 - 46 %   MCV 82.7 80.0 - 100.0 fL   MCH 24.8 (L) 26.0 - 34.0 pg   MCHC 30.1 30.0 - 36.0 g/dL   RDW 70.6 23.7 - 62.8 %   Platelets 307 150 - 400 K/uL   nRBC 0.0 0.0 - 0.2 %    Comment: Performed at [facility], [address], [place], [place] [address]     RADIOLOGY RESULTS: No results found.  Problem List: Patient Active Problem List   Diagnosis Date Noted  . Cholelithiasis   . Epigastric abdominal pain   . Acute cholecystitis 04/05/2020  . Hypocalcemia 04/05/2020  . Hypophosphatemia 04/05/2020  . Morbid obesity with BMI of 50.0-59.9, adult (HCC) 04/05/2020  . AKI (acute kidney injury) (HCC) 04/03/2020  . Acute pancreatitis 04/03/2020  . Hypothyroidism 04/03/2020  . Hyperglycemia 04/03/2020  . Elevated liver enzymes   . Acute respiratory failure with hypoxemia (HCC)   . SIRS (systemic inflammatory response syndrome) (HCC) 04/02/2020    Assessment & Plan: For laparoscopic cholecystectomy and removal of previously placed drains.    Matt B. Daphine Deutscher, MD, [facility] [phone] beeper [phone]  07/01/2020 4:57 PM

## 2020-07-02 MED ORDER — DEXTROSE 5 % IV SOLN
3.0000 g | INTRAVENOUS | Status: AC
  Administered 2020-07-03: 3 g via INTRAVENOUS
  Filled 2020-07-02: qty 3

## 2020-07-02 MED ORDER — BUPIVACAINE LIPOSOME 1.3 % IJ SUSP
20.0000 mL | Freq: Once | INTRAMUSCULAR | Status: DC
  Filled 2020-07-02: qty 20

## 2020-07-03 ENCOUNTER — Encounter (HOSPITAL_COMMUNITY): Payer: Self-pay | Admitting: Surgery

## 2020-07-03 ENCOUNTER — Other Ambulatory Visit: Payer: Self-pay

## 2020-07-03 ENCOUNTER — Ambulatory Visit (HOSPITAL_COMMUNITY): Payer: BC Managed Care – PPO | Admitting: Certified Registered Nurse Anesthetist

## 2020-07-03 ENCOUNTER — Observation Stay (HOSPITAL_COMMUNITY)
Admission: RE | Admit: 2020-07-03 | Discharge: 2020-07-05 | Disposition: A | Discharge status: Home / Self Care | Payer: BC Managed Care – PPO | Attending: Surgery | Admitting: Surgery

## 2020-07-03 ENCOUNTER — Encounter (HOSPITAL_COMMUNITY)
Admission: RE | Disposition: A | Discharge status: Home / Self Care | Payer: Self-pay | Source: Home / Self Care | Attending: Surgery

## 2020-07-03 DIAGNOSIS — K8010 Calculus of gallbladder with chronic cholecystitis without obstruction: Principal | ICD-10-CM | POA: Insufficient documentation

## 2020-07-03 DIAGNOSIS — Z6841 Body Mass Index (BMI) 40.0 and over, adult: Secondary | ICD-10-CM | POA: Insufficient documentation

## 2020-07-03 DIAGNOSIS — E039 Hypothyroidism, unspecified: Secondary | ICD-10-CM | POA: Diagnosis not present

## 2020-07-03 DIAGNOSIS — Z79890 Hormone replacement therapy (postmenopausal): Secondary | ICD-10-CM | POA: Insufficient documentation

## 2020-07-03 DIAGNOSIS — Z419 Encounter for procedure for purposes other than remedying health state, unspecified: Secondary | ICD-10-CM

## 2020-07-03 DIAGNOSIS — N179 Acute kidney failure, unspecified: Secondary | ICD-10-CM | POA: Diagnosis not present

## 2020-07-03 DIAGNOSIS — K859 Acute pancreatitis without necrosis or infection, unspecified: Secondary | ICD-10-CM | POA: Diagnosis not present

## 2020-07-03 DIAGNOSIS — K851 Biliary acute pancreatitis without necrosis or infection: Secondary | ICD-10-CM | POA: Diagnosis not present

## 2020-07-03 DIAGNOSIS — K8000 Calculus of gallbladder with acute cholecystitis without obstruction: Secondary | ICD-10-CM | POA: Diagnosis not present

## 2020-07-03 DIAGNOSIS — K819 Cholecystitis, unspecified: Secondary | ICD-10-CM | POA: Diagnosis present

## 2020-07-03 DIAGNOSIS — Z23 Encounter for immunization: Secondary | ICD-10-CM | POA: Diagnosis not present

## 2020-07-03 HISTORY — PX: CHOLECYSTECTOMY: SHX55

## 2020-07-03 LAB — CREATININE, SERUM
Creatinine, Ser: 0.73 mg/dL (ref 0.44–1.00)
GFR calc Af Amer: >60 mL/min (ref >60)
GFR calc non Af Amer: >60 mL/min (ref >60)

## 2020-07-03 LAB — PREGNANCY, URINE: Preg Test, Ur: NEGATIVE

## 2020-07-03 LAB — CBC
HCT: 38.3 % (ref 36.0–46.0)
Hemoglobin: 11.7 g/dL — ABNORMAL LOW (ref 12.0–15.0)
MCH: 25.3 pg — ABNORMAL LOW (ref 26.0–34.0)
MCHC: 30.5 g/dL (ref 30.0–36.0)
MCV: 82.9 fL (ref 80.0–100.0)
Platelets: 254 10*3/uL (ref 150–400)
RBC: 4.62 MIL/uL (ref 3.87–5.11)
RDW: 14.8 % (ref 11.5–15.5)
WBC: 13.8 10*3/uL — ABNORMAL HIGH (ref 4.0–10.5)
nRBC: 0 % (ref 0.0–0.2)

## 2020-07-03 SURGERY — LAPAROSCOPIC CHOLECYSTECTOMY WITH INTRAOPERATIVE CHOLANGIOGRAM
Anesthesia: General

## 2020-07-03 MED ORDER — KETOROLAC TROMETHAMINE 30 MG/ML IJ SOLN
INTRAMUSCULAR | Status: AC
  Filled 2020-07-03: qty 1

## 2020-07-03 MED ORDER — CEFAZOLIN SODIUM-DEXTROSE 2-4 GM/100ML-% IV SOLN
2.0000 g | Freq: Three times a day (TID) | INTRAVENOUS | Status: AC
  Administered 2020-07-03: 2 g via INTRAVENOUS
  Filled 2020-07-03: qty 100

## 2020-07-03 MED ORDER — PROPOFOL 10 MG/ML IV BOLUS
INTRAVENOUS | Status: DC | PRN
  Administered 2020-07-03: 270 mg via INTRAVENOUS

## 2020-07-03 MED ORDER — ONDANSETRON HCL 4 MG/2ML IJ SOLN: 4.0000 mg | Freq: Four times a day (QID) | INTRAMUSCULAR | Status: DC | PRN

## 2020-07-03 MED ORDER — BUPIVACAINE LIPOSOME 1.3 % IJ SUSP
INTRAMUSCULAR | Status: DC | PRN
  Administered 2020-07-03: 20 mL

## 2020-07-03 MED ORDER — MIDAZOLAM HCL 5 MG/5ML IJ SOLN
INTRAMUSCULAR | Status: DC | PRN
  Administered 2020-07-03: 2 mg via INTRAVENOUS

## 2020-07-03 MED ORDER — ENSURE PRE-SURGERY PO LIQD: 296.0000 mL | Freq: Once | ORAL | Status: DC

## 2020-07-03 MED ORDER — FENTANYL CITRATE (PF) 100 MCG/2ML IJ SOLN
INTRAMUSCULAR | Status: AC
Start: 2020-07-03 — End: 2020-07-04
  Filled 2020-07-03: qty 2

## 2020-07-03 MED ORDER — ORAL CARE MOUTH RINSE: 15.0000 mL | Freq: Once | OROMUCOSAL | Status: AC

## 2020-07-03 MED ORDER — ONDANSETRON HCL 4 MG/2ML IJ SOLN
INTRAMUSCULAR | Status: DC | PRN
  Administered 2020-07-03: 4 mg via INTRAVENOUS

## 2020-07-03 MED ORDER — METOPROLOL TARTRATE 5 MG/5ML IV SOLN
2.0000 mg | Freq: Once | INTRAVENOUS | Status: AC
  Administered 2020-07-03: 2 mg via INTRAVENOUS

## 2020-07-03 MED ORDER — KCL IN DEXTROSE-NACL 20-5-0.45 MEQ/L-%-% IV SOLN
INTRAVENOUS | Status: DC
  Filled 2020-07-03 (×2): qty 1000

## 2020-07-03 MED ORDER — INFLUENZA VAC SPLIT QUAD 0.5 ML IM SUSY
0.5000 mL | PREFILLED_SYRINGE | INTRAMUSCULAR | Status: AC
  Administered 2020-07-04: 0.5 mL via INTRAMUSCULAR
  Filled 2020-07-03: qty 0.5

## 2020-07-03 MED ORDER — CHLORHEXIDINE GLUCONATE CLOTH 2 % EX PADS: 6.0000 | MEDICATED_PAD | Freq: Once | CUTANEOUS | Status: DC

## 2020-07-03 MED ORDER — PROPOFOL 10 MG/ML IV BOLUS
INTRAVENOUS | Status: AC
  Filled 2020-07-03: qty 20

## 2020-07-03 MED ORDER — DEXAMETHASONE SODIUM PHOSPHATE 10 MG/ML IJ SOLN
INTRAMUSCULAR | Status: AC
  Filled 2020-07-03: qty 1

## 2020-07-03 MED ORDER — RINGERS IRRIGATION IR SOLN
Status: DC | PRN
  Administered 2020-07-03: 1000 mL

## 2020-07-03 MED ORDER — HYDROMORPHONE HCL 2 MG/ML IJ SOLN
INTRAMUSCULAR | Status: AC
  Filled 2020-07-03: qty 1

## 2020-07-03 MED ORDER — PROMETHAZINE HCL 25 MG/ML IJ SOLN: 6.2500 mg | INTRAMUSCULAR | Status: DC | PRN

## 2020-07-03 MED ORDER — METOPROLOL TARTRATE 5 MG/5ML IV SOLN
INTRAVENOUS | Status: AC
  Filled 2020-07-03: qty 5

## 2020-07-03 MED ORDER — SODIUM CHLORIDE (PF) 0.9 % IJ SOLN
INTRAMUSCULAR | Status: AC
  Filled 2020-07-03: qty 50

## 2020-07-03 MED ORDER — ROCURONIUM BROMIDE 10 MG/ML (PF) SYRINGE
PREFILLED_SYRINGE | INTRAVENOUS | Status: AC
  Filled 2020-07-03: qty 10

## 2020-07-03 MED ORDER — METOPROLOL TARTRATE 5 MG/5ML IV SOLN: 5.0000 mg | Freq: Four times a day (QID) | INTRAVENOUS | Status: DC | PRN

## 2020-07-03 MED ORDER — FENTANYL CITRATE (PF) 100 MCG/2ML IJ SOLN
INTRAMUSCULAR | Status: AC
  Filled 2020-07-03: qty 2

## 2020-07-03 MED ORDER — KETOROLAC TROMETHAMINE 30 MG/ML IJ SOLN
30.0000 mg | Freq: Once | INTRAMUSCULAR | Status: AC | PRN
  Administered 2020-07-03: 30 mg via INTRAVENOUS

## 2020-07-03 MED ORDER — HYDROMORPHONE HCL 1 MG/ML IJ SOLN
INTRAMUSCULAR | Status: DC | PRN
  Administered 2020-07-03: 1 mg via INTRAVENOUS
  Administered 2020-07-03 (×2): .5 mg via INTRAVENOUS

## 2020-07-03 MED ORDER — CHLORHEXIDINE GLUCONATE 0.12 % MT SOLN
15.0000 mL | Freq: Once | OROMUCOSAL | Status: AC
  Administered 2020-07-03: 15 mL via OROMUCOSAL

## 2020-07-03 MED ORDER — FENTANYL CITRATE (PF) 100 MCG/2ML IJ SOLN
25.0000 ug | INTRAMUSCULAR | Status: DC | PRN
  Administered 2020-07-03 (×2): 50 ug via INTRAVENOUS

## 2020-07-03 MED ORDER — ONDANSETRON HCL 4 MG/2ML IJ SOLN
INTRAMUSCULAR | Status: AC
  Filled 2020-07-03: qty 2

## 2020-07-03 MED ORDER — LABETALOL HCL 5 MG/ML IV SOLN
INTRAVENOUS | Status: DC | PRN
  Administered 2020-07-03 (×4): 5 mg via INTRAVENOUS

## 2020-07-03 MED ORDER — OXYCODONE HCL 5 MG/5ML PO SOLN: 5.0000 mg | Freq: Once | ORAL | Status: DC | PRN

## 2020-07-03 MED ORDER — FENTANYL CITRATE (PF) 250 MCG/5ML IJ SOLN
INTRAMUSCULAR | Status: DC | PRN
  Administered 2020-07-03 (×9): 50 ug via INTRAVENOUS

## 2020-07-03 MED ORDER — LIDOCAINE 2% (20 MG/ML) 5 ML SYRINGE
INTRAMUSCULAR | Status: DC | PRN
  Administered 2020-07-03: 100 mg via INTRAVENOUS

## 2020-07-03 MED ORDER — SUCCINYLCHOLINE CHLORIDE 200 MG/10ML IV SOSY
PREFILLED_SYRINGE | INTRAVENOUS | Status: DC | PRN
  Administered 2020-07-03: 160 mg via INTRAVENOUS

## 2020-07-03 MED ORDER — PANTOPRAZOLE SODIUM 40 MG IV SOLR
40.0000 mg | Freq: Every day | INTRAVENOUS | Status: DC
  Administered 2020-07-03 – 2020-07-04 (×2): 40 mg via INTRAVENOUS
  Filled 2020-07-03 (×2): qty 40

## 2020-07-03 MED ORDER — FENTANYL CITRATE (PF) 250 MCG/5ML IJ SOLN
INTRAMUSCULAR | Status: AC
Start: 2020-07-03 — End: ?
  Filled 2020-07-03: qty 5

## 2020-07-03 MED ORDER — LACTATED RINGERS IV SOLN
INTRAVENOUS | Status: DC
  Administered 2020-07-03: 1000 mL via INTRAVENOUS

## 2020-07-03 MED ORDER — MORPHINE SULFATE (PF) 2 MG/ML IV SOLN
1.0000 mg | INTRAVENOUS | Status: DC | PRN
  Administered 2020-07-04 (×2): 1 mg via INTRAVENOUS
  Filled 2020-07-03 (×2): qty 1

## 2020-07-03 MED ORDER — MIDAZOLAM HCL 2 MG/2ML IJ SOLN
INTRAMUSCULAR | Status: AC
  Filled 2020-07-03: qty 2

## 2020-07-03 MED ORDER — SCOPOLAMINE 1 MG/3DAYS TD PT72
1.0000 | MEDICATED_PATCH | TRANSDERMAL | Status: DC
  Administered 2020-07-03: 1.5 mg via TRANSDERMAL
  Filled 2020-07-03: qty 1

## 2020-07-03 MED ORDER — SUGAMMADEX SODIUM 500 MG/5ML IV SOLN
INTRAVENOUS | Status: DC | PRN
  Administered 2020-07-03: 300 mg via INTRAVENOUS

## 2020-07-03 MED ORDER — DEXAMETHASONE SODIUM PHOSPHATE 10 MG/ML IJ SOLN
INTRAMUSCULAR | Status: DC | PRN
  Administered 2020-07-03: 5 mg via INTRAVENOUS

## 2020-07-03 MED ORDER — ONDANSETRON 4 MG PO TBDP: 4.0000 mg | ORAL_TABLET | Freq: Four times a day (QID) | ORAL | Status: DC | PRN

## 2020-07-03 MED ORDER — OXYCODONE HCL 5 MG PO TABS: 5.0000 mg | ORAL_TABLET | Freq: Once | ORAL | Status: DC | PRN

## 2020-07-03 MED ORDER — HYDROCODONE-ACETAMINOPHEN 5-325 MG PO TABS
1.0000 | ORAL_TABLET | ORAL | Status: DC | PRN
  Administered 2020-07-04 – 2020-07-05 (×3): 2 via ORAL
  Filled 2020-07-03 (×3): qty 2

## 2020-07-03 MED ORDER — POVIDONE-IODINE 10 % EX OINT
TOPICAL_OINTMENT | CUTANEOUS | Status: AC
  Filled 2020-07-03: qty 28.35

## 2020-07-03 MED ORDER — ROCURONIUM BROMIDE 10 MG/ML (PF) SYRINGE
PREFILLED_SYRINGE | INTRAVENOUS | Status: DC | PRN
  Administered 2020-07-03 (×3): 20 mg via INTRAVENOUS
  Administered 2020-07-03: 40 mg via INTRAVENOUS
  Administered 2020-07-03: 20 mg via INTRAVENOUS

## 2020-07-03 MED ORDER — HEPARIN SODIUM (PORCINE) 5000 UNIT/ML IJ SOLN
5000.0000 [IU] | Freq: Three times a day (TID) | INTRAMUSCULAR | Status: DC
  Administered 2020-07-03 – 2020-07-05 (×5): 5000 [IU] via SUBCUTANEOUS
  Filled 2020-07-03 (×5): qty 1

## 2020-07-03 MED ORDER — HYDRALAZINE HCL 20 MG/ML IJ SOLN
INTRAMUSCULAR | Status: DC | PRN
  Administered 2020-07-03 (×2): 5 mg via INTRAVENOUS

## 2020-07-03 SURGICAL SUPPLY — 67 items
ADH SKN CLS APL DERMABOND .7 (GAUZE/BANDAGES/DRESSINGS) ×1
APL SKNCLS STERI-STRIP NONHPOA (GAUZE/BANDAGES/DRESSINGS)
APL SWBSTK 6 STRL LF DISP (MISCELLANEOUS) ×2
APPLICATOR COTTON TIP 6 STRL (MISCELLANEOUS) ×2 IMPLANT
APPLICATOR COTTON TIP 6IN STRL (MISCELLANEOUS) ×6
APPLIER CLIP 5 13 M/L LIGAMAX5 (MISCELLANEOUS)
APPLIER CLIP ROT 10 11.4 M/L (STAPLE)
APR CLP MED LRG 11.4X10 (STAPLE)
APR CLP MED LRG 5 ANG JAW (MISCELLANEOUS)
BAG SPEC RTRVL 10 TROC 200 (ENDOMECHANICALS)
BAG SPEC RTRVL LRG 6X4 10 (ENDOMECHANICALS) ×1
BENZOIN TINCTURE PRP APPL 2/3 (GAUZE/BANDAGES/DRESSINGS) IMPLANT
CABLE HIGH FREQUENCY MONO STRZ (ELECTRODE) IMPLANT
CATH REDDICK CHOLANGI 4FR 50CM (CATHETERS) ×3 IMPLANT
CLIP APPLIE 5 13 M/L LIGAMAX5 (MISCELLANEOUS) IMPLANT
CLIP APPLIE ROT 10 11.4 M/L (STAPLE) IMPLANT
CLOSURE WOUND 1/2 X4 (GAUZE/BANDAGES/DRESSINGS)
COVER MAYO STAND STRL (DRAPES) ×3 IMPLANT
COVER SURGICAL LIGHT HANDLE (MISCELLANEOUS) ×3 IMPLANT
COVER WAND RF STERILE (DRAPES) IMPLANT
DECANTER SPIKE VIAL GLASS SM (MISCELLANEOUS) ×3 IMPLANT
DERMABOND ADVANCED (GAUZE/BANDAGES/DRESSINGS) ×2
DERMABOND ADVANCED .7 DNX12 (GAUZE/BANDAGES/DRESSINGS) ×1 IMPLANT
DEVICE SUTURE ENDOST 10MM (ENDOMECHANICALS) ×2 IMPLANT
DISSECTOR BLUNT TIP ENDO 5MM (MISCELLANEOUS) ×2 IMPLANT
DRAIN CHANNEL 19F RND (DRAIN) ×2 IMPLANT
DRAPE C-ARM 42X120 X-RAY (DRAPES) ×3 IMPLANT
DRSG TEGADERM 4X4.75 (GAUZE/BANDAGES/DRESSINGS) ×6 IMPLANT
ELECT L-HOOK LAP 45CM DISP (ELECTROSURGICAL)
ELECT PENCIL ROCKER SW 15FT (MISCELLANEOUS) IMPLANT
ELECT REM PT RETURN 15FT ADLT (MISCELLANEOUS) ×3 IMPLANT
ELECTRODE L-HOOK LAP 45CM DISP (ELECTROSURGICAL) IMPLANT
EVACUATOR SILICONE 100CC (DRAIN) ×2 IMPLANT
GAUZE SPONGE 2X2 8PLY STRL LF (GAUZE/BANDAGES/DRESSINGS) IMPLANT
GLOVE BIOGEL M 8.0 STRL (GLOVE) ×3 IMPLANT
GOWN STRL REUS W/TWL XL LVL3 (GOWN DISPOSABLE) ×3 IMPLANT
HEMOSTAT SURGICEL 4X8 (HEMOSTASIS) IMPLANT
IV CATH 14GX2 1/4 (CATHETERS) ×3 IMPLANT
KIT BASIN OR (CUSTOM PROCEDURE TRAY) ×3 IMPLANT
KIT TURNOVER KIT A (KITS) IMPLANT
NDL SPNL 22GX3.5 QUINCKE BK (NEEDLE) IMPLANT
NEEDLE SPNL 22GX3.5 QUINCKE BK (NEEDLE) ×3 IMPLANT
POUCH RETRIEVAL ECOSAC 10 (ENDOMECHANICALS) IMPLANT
POUCH RETRIEVAL ECOSAC 10MM (ENDOMECHANICALS)
POUCH SPECIMEN RETRIEVAL 10MM (ENDOMECHANICALS) ×2 IMPLANT
RELOAD ENDO STITCH 2.0 (ENDOMECHANICALS) ×3
RELOAD SUT SNGL STCH ABSRB 2-0 (ENDOMECHANICALS) IMPLANT
SCISSORS LAP 5X45 EPIX DISP (ENDOMECHANICALS) ×3 IMPLANT
SET IRRIG TUBING LAPAROSCOPIC (IRRIGATION / IRRIGATOR) ×3 IMPLANT
SET TUBE SMOKE EVAC HIGH FLOW (TUBING) ×3 IMPLANT
SHEARS HARMONIC ACE PLUS 45CM (MISCELLANEOUS) ×2 IMPLANT
SLEEVE XCEL OPT CAN 5 100 (ENDOMECHANICALS) ×3 IMPLANT
SPONGE GAUZE 2X2 STER 10/PKG (GAUZE/BANDAGES/DRESSINGS) ×2
STAPLER VISISTAT 35W (STAPLE) ×2 IMPLANT
STRIP CLOSURE SKIN 1/2X4 (GAUZE/BANDAGES/DRESSINGS) IMPLANT
SUT ETHILON 2 0 PS N (SUTURE) ×2 IMPLANT
SUT MNCRL AB 4-0 PS2 18 (SUTURE) ×6 IMPLANT
SUT RELOAD ENDO STITCH 2 48X1 (ENDOMECHANICALS) ×1
SUTURE RELOAD END STTCH 2 48X1 (ENDOMECHANICALS) ×1 IMPLANT
SYR 20ML LL LF (SYRINGE) ×3 IMPLANT
TOWEL OR 17X26 10 PK STRL BLUE (TOWEL DISPOSABLE) ×3 IMPLANT
TRAY LAPAROSCOPIC (CUSTOM PROCEDURE TRAY) ×3 IMPLANT
TROCAR ADV FIXATION 5X100MM (TROCAR) ×2 IMPLANT
TROCAR BLADELESS OPT 5 100 (ENDOMECHANICALS) ×3 IMPLANT
TROCAR BLADELESS OPT 5 150 (ENDOMECHANICALS) ×2 IMPLANT
TROCAR XCEL BLUNT TIP 100MML (ENDOMECHANICALS) IMPLANT
TROCAR XCEL NON-BLD 11X100MML (ENDOMECHANICALS) IMPLANT

## 2020-07-03 NOTE — Interval H&P Note (Signed)
History and Physical Interval Note:  07/03/2020 10:52 AM  Amber May  has presented today for surgery, with the diagnosis of PERCUTANEOUS DRAINAGE OF GALLBLADDER.  The various methods of treatment have been discussed with the patient and family. After consideration of risks, benefits and other options for treatment, the patient has consented to  Procedure(s): LAPAROSCOPIC CHOLECYSTECTOMY WITH INTRAOPERATIVE CHOLANGIOGRAM FOR SEVERE CHOLECYSTITIS (N/A) as a surgical intervention.  The patient's history has been reviewed, patient examined, no change in status, stable for surgery.  I have reviewed the patient's chart and labs.  Questions were answered to the patient's satisfaction.     Valarie Merino

## 2020-07-03 NOTE — Transfer of Care (Signed)
Immediate Anesthesia Transfer of Care Note  Patient: Amber May  Procedure(s) Performed: LAPAROSCOPIC CHOLECYSTECTOMY WITH INTRAOPERATIVE CHOLANGIOGRAM FOR SEVERE CHOLECYSTITIS (N/A )  Patient Location: PACU  Anesthesia Type:General  Level of Consciousness: awake, alert , oriented and patient cooperative  Airway & Oxygen Therapy: Patient Spontanous Breathing and Patient connected to face mask oxygen  Post-op Assessment: Report given to RN, Post -op Vital signs reviewed and stable and Patient moving all extremities X 4  Post vital signs: stable  Last Vitals:  Vitals Value Taken Time  BP 164/77 07/03/20 1515  Temp 36.8 C 07/03/20 1509  Pulse 96 07/03/20 1517  Resp 22 07/03/20 1517  SpO2 99 % 07/03/20 1517  Vitals shown include unvalidated device data.  Last Pain:  Vitals:   07/03/20 1509  TempSrc:   PainSc: 0-No pain         Complications: No complications documented.

## 2020-07-03 NOTE — Anesthesia Preprocedure Evaluation (Signed)
Anesthesia Evaluation  Patient identified by MRN, date of birth, ID band Patient awake    Reviewed: Allergy & Precautions, NPO status , Patient's Chart, lab work & pertinent test results  Airway Mallampati: II  TM Distance: <3 FB Neck ROM: Full    Dental no notable dental hx.    Pulmonary neg pulmonary ROS,    Pulmonary exam normal breath sounds clear to auscultation       Cardiovascular negative cardio ROS Normal cardiovascular exam Rhythm:Regular Rate:Normal     Neuro/Psych negative neurological ROS  negative psych ROS   GI/Hepatic negative GI ROS, Neg liver ROS,   Endo/Other  Hypothyroidism Morbid obesity  Renal/GU negative Renal ROS  negative genitourinary   Musculoskeletal negative musculoskeletal ROS (+)   Abdominal   Peds negative pediatric ROS (+)  Hematology negative hematology ROS (+)   Anesthesia Other Findings   Reproductive/Obstetrics negative OB ROS                             Anesthesia Physical Anesthesia Plan  ASA: III  Anesthesia Plan: General   Post-op Pain Management:    Induction: Intravenous  PONV Risk Score and Plan: 3 and Ondansetron and Dexamethasone  Airway Management Planned: Oral ETT  Additional Equipment:   Intra-op Plan:   Post-operative Plan: Extubation in OR  Informed Consent: I have reviewed the patients History and Physical, chart, labs and discussed the procedure including the risks, benefits and alternatives for the proposed anesthesia with the patient or authorized representative who has indicated his/her understanding and acceptance.     Dental advisory given  Plan Discussed with: CRNA and Surgeon  Anesthesia Plan Comments:         Anesthesia Quick Evaluation

## 2020-07-03 NOTE — Anesthesia Procedure Notes (Signed)
Procedure Name: Intubation Date/Time: 07/03/2020 11:47 AM Performed by: West Pugh, CRNA Pre-anesthesia Checklist: Patient identified, Emergency Drugs available, Suction available, Patient being monitored and Timeout performed Patient Re-evaluated:Patient Re-evaluated prior to induction Oxygen Delivery Method: Circle system utilized Preoxygenation: Pre-oxygenation with 100% oxygen Induction Type: IV induction Ventilation: Mask ventilation without difficulty Laryngoscope Size: Mac and 3 Grade View: Grade II Tube type: Oral Tube size: 7.0 mm Number of attempts: 1 Airway Equipment and Method: Stylet Placement Confirmation: ETT inserted through vocal cords under direct vision,  positive ETCO2,  CO2 detector and breath sounds checked- equal and bilateral Secured at: 21 cm Tube secured with: Tape Dental Injury: Teeth and Oropharynx as per pre-operative assessment

## 2020-07-03 NOTE — Op Note (Signed)
Amber May  Primary Care Physician:  Patient, No Pcp Per    07/03/2020  2:44 PM  Procedure: Laparoscopic anterior Cholecystectomy   Surgeon: Susy Frizzle B. Daphine Deutscher, MD, FACS Asst:  Darnell Level, MD, Ovidio Kin, MD  Anes:  General  Drains:  19 blake  Findings: Severe reaction from the multiple percutaneous drains in and around the gallbladder with many cholesterol stones     Description of Procedure: The patient was taken to OR 1 and given general anesthesia.  The patient was prepped with chlorohexidine prep and draped sterilely. A time out was performed including identifying the patient and discussing their procedure.  Access to the abdomen was achieved with 5 mm Optiview through the left upper quadrant entering a space visualizing the small bowel and the transverse colon with the omentum totally adherent to the anterior abdominal wall.  We tried to take that down from the left upper quadrant but elected to enter the abdomen up in the upper midline with a 5 mm under an obturator Optiview fashion and get into the fatty layer and then manually stripping the omentum from the peritoneum.  We were able to do that and it took some time but we got the transverse colon down and we identified the stomach and the liver with some difficulty and worked our way to the left the right upper quadrant where we found the pigtail catheter which was outside of the gallbladder and the other one which was fairly superficial it actually been removed.  We removed the pigtail catheter and placed a 5 mm trocar through that.  The liver was stuck up to the anterior abdominal wall and we found the gallbladder and then with blunt dissection using the suction tip we swept adhesions away from this.Marland Kitchen  Port placement included eventually 5 trochars with a 12 laterally on the left for the suture closure of the gallbladder infundibulum..    The gallbladder was visualized and appeared thickened and discerning the junction between  the liver and the gallbladder was initially difficult.  We went down to the infundibulum and was able to dissected what was likely Kalos triangle.  With traction in this area I was able to work way up along the anterior portion of the gallbladder to the fundus..   The fundus of the gallbaldder was grasped and the gallbladder was elevated.  Because of the nature of the adhesions we were unable to obtain a critical view although I was reasonably certain of the anatomy.  There was a lot of displacement by the infundibulum full of stones.  At the junction between the infundibulum and the fundus we made a rent and began milking out small cholesterol gallstones.  These were many in number and of smooth and we elected to control the removal of the stones with the large 10 mm scooper.  At this point we felt that the dissection would best be most safely done with the removal of the anterior wall the gallbladder and cauterizing the back wall.  The infundibulum was divided and mucosa was identified throughout the circumference.  There were no other stones seen down there we got bile refluxing back.  This was closed with a 2-0 Vicryl Endo Stitch running locked running suture.     The gallbladder was then placed in a bag and brought out through one of the trocar sites. The gallbladder bed was inspected and no bleeding or bile leaks were seen.  The cautery was then used to cauterize the remaining remnant  mucosa.  A 19 Blake drain been placed and secured with a 2-0 nylon.  The trocar sites were closed with were injected with Exparel and closed with 4-0 Monocryl.   The patient was taken to the recovery room in satisfactory condition.  Katha Hamming, MD, FACS

## 2020-07-03 NOTE — Anesthesia Postprocedure Evaluation (Signed)
Anesthesia Post Note  Patient: Amber May  Procedure(s) Performed: LAPAROSCOPIC CHOLECYSTECTOMY WITH INTRAOPERATIVE CHOLANGIOGRAM FOR SEVERE CHOLECYSTITIS (N/A )     Patient location during evaluation: PACU Anesthesia Type: General Level of consciousness: awake and alert Pain management: pain level controlled Vital Signs Assessment: post-procedure vital signs reviewed and stable Respiratory status: spontaneous breathing, nonlabored ventilation, respiratory function stable and patient connected to nasal cannula oxygen Cardiovascular status: blood pressure returned to baseline and stable Postop Assessment: no apparent nausea or vomiting Anesthetic complications: no   No complications documented.  Last Vitals:  Vitals:   07/03/20 1600 07/03/20 1615  BP: (!) 182/70 (!) 176/89  Pulse: (!) 104 98  Resp: (!) 28 (!) 23  Temp:    SpO2: 93% 93%    Last Pain:  Vitals:   07/03/20 1615  TempSrc:   PainSc: 6                  [name] S

## 2020-07-04 ENCOUNTER — Encounter (HOSPITAL_COMMUNITY): Payer: Self-pay | Admitting: Surgery

## 2020-07-04 DIAGNOSIS — Z6841 Body Mass Index (BMI) 40.0 and over, adult: Secondary | ICD-10-CM | POA: Diagnosis not present

## 2020-07-04 DIAGNOSIS — Z23 Encounter for immunization: Secondary | ICD-10-CM | POA: Diagnosis not present

## 2020-07-04 DIAGNOSIS — K8010 Calculus of gallbladder with chronic cholecystitis without obstruction: Secondary | ICD-10-CM | POA: Diagnosis not present

## 2020-07-04 DIAGNOSIS — K859 Acute pancreatitis without necrosis or infection, unspecified: Secondary | ICD-10-CM | POA: Diagnosis not present

## 2020-07-04 DIAGNOSIS — Z79890 Hormone replacement therapy (postmenopausal): Secondary | ICD-10-CM | POA: Diagnosis not present

## 2020-07-04 DIAGNOSIS — E039 Hypothyroidism, unspecified: Secondary | ICD-10-CM | POA: Diagnosis not present

## 2020-07-04 LAB — CBC
HCT: 36.9 % (ref 36.0–46.0)
Hemoglobin: 11.1 g/dL — ABNORMAL LOW (ref 12.0–15.0)
MCH: 25.1 pg — ABNORMAL LOW (ref 26.0–34.0)
MCHC: 30.1 g/dL (ref 30.0–36.0)
MCV: 83.5 fL (ref 80.0–100.0)
Platelets: 264 10*3/uL (ref 150–400)
RBC: 4.42 MIL/uL (ref 3.87–5.11)
RDW: 14.6 % (ref 11.5–15.5)
WBC: 12.4 10*3/uL — ABNORMAL HIGH (ref 4.0–10.5)
nRBC: 0 % (ref 0.0–0.2)

## 2020-07-04 LAB — COMPREHENSIVE METABOLIC PANEL
ALT: 17 U/L (ref 0–44)
AST: 28 U/L (ref 15–41)
Albumin: 3.3 g/dL — ABNORMAL LOW (ref 3.5–5.0)
Alkaline Phosphatase: 44 U/L (ref 38–126)
Anion gap: 8 (ref 5–15)
BUN: 11 mg/dL (ref 6–20)
CO2: 25 mmol/L (ref 22–32)
Calcium: 8.9 mg/dL (ref 8.9–10.3)
Chloride: 106 mmol/L (ref 98–111)
Creatinine, Ser: 0.58 mg/dL (ref 0.44–1.00)
GFR calc Af Amer: >60 mL/min (ref >60)
GFR calc non Af Amer: >60 mL/min (ref >60)
Glucose, Bld: 154 mg/dL — ABNORMAL HIGH (ref 70–99)
Potassium: 4 mmol/L (ref 3.5–5.1)
Sodium: 139 mmol/L (ref 135–145)
Total Bilirubin: 0.3 mg/dL (ref 0.3–1.2)
Total Protein: 6.2 g/dL — ABNORMAL LOW (ref 6.5–8.1)

## 2020-07-04 NOTE — Plan of Care (Signed)
  Problem: Education: Goal: Required Educational Video(s) Outcome: Progressing   Problem: Education: Goal: Knowledge of General Education information will improve Description: Including pain rating scale, medication(s)/side effects and non-pharmacologic comfort measures Outcome: Progressing   Problem: Nutrition: Goal: Adequate nutrition will be maintained Outcome: Progressing

## 2020-07-04 NOTE — Progress Notes (Signed)
1 Day Post-Op lap chole Subjective: No acute issues.  Pain controlled.  No nausea  Objective: Vital signs in last 24 hours: Temp:  [97.8 F (36.6 C)-98.5 F (36.9 C)] 98.5 F (36.9 C) (09/25 0519) Pulse Rate:  [67-104] 67 (09/25 0519) Resp:  [15-28] 16 (09/25 0519) BP: (118-182)/(61-103) 145/78 (09/25 0519) SpO2:  [93 %-99 %] 96 % (09/25 0519) Weight:  [128.4 kg] 128.4 kg (09/24 0905)   Intake/Output from previous day: 09/24 0701 - 09/25 0700 In: 3443.9 [P.O.:480; I.V.:2913.9; IV Piggyback:50] Out: 160 [Drains:60; Blood:100] Intake/Output this shift: No intake/output data recorded.   General appearance: alert and cooperative GI: normal findings: soft, non-tender JP: sanguinous and slightly bilious Incision: no significant drainage  Lab Results:  Recent Labs    07/03/20 1846 07/04/20 0352  WBC 13.8* 12.4*  HGB 11.7* 11.1*  HCT 38.3 36.9  PLT 254 264   BMET Recent Labs    07/03/20 1751 07/04/20 0352  NA  --  139  K  --  4.0  CL  --  106  CO2  --  25  GLUCOSE  --  154*  BUN  --  11  CREATININE 0.73 0.58  CALCIUM  --  8.9   PT/INR No results for input(s): LABPROT, INR in the last 72 hours. ABG No results for input(s): PHART, HCO3 in the last 72 hours.  Invalid input(s): PCO2, PO2  MEDS, Scheduled . heparin injection (subcutaneous)  5,000 Units Subcutaneous Q8H  . influenza vac split quadrivalent PF  0.5 mL Intramuscular Tomorrow-1000  . pantoprazole (PROTONIX) IV  40 mg Intravenous QHS    Studies/Results: No results found.  Assessment: s/p Procedure(s): LAPAROSCOPIC CHOLECYSTECTOMY WITH INTRAOPERATIVE CHOLANGIOGRAM FOR SEVERE CHOLECYSTITIS Patient Active Problem List   Diagnosis Date Noted  . Cholecystitis 07/03/2020  . Cholelithiasis   . Epigastric abdominal pain   . Acute cholecystitis 04/05/2020  . Hypocalcemia 04/05/2020  . Hypophosphatemia 04/05/2020  . Morbid obesity with BMI of 50.0-59.9, adult (HCC) 04/05/2020  . AKI (acute kidney  injury) (HCC) 04/03/2020  . Acute pancreatitis 04/03/2020  . Hypothyroidism 04/03/2020  . Hyperglycemia 04/03/2020  . Elevated liver enzymes   . Acute respiratory failure with hypoxemia (HCC)   . SIRS (systemic inflammatory response syndrome) (HCC) 04/02/2020      Plan: Advance diet  Ambulate in hall Will cont to monitor drain output   LOS: 0 days     .[name], MD [facility], [place]    07/04/2020 8:40 AM

## 2020-07-05 DIAGNOSIS — Z23 Encounter for immunization: Secondary | ICD-10-CM | POA: Diagnosis not present

## 2020-07-05 DIAGNOSIS — Z79890 Hormone replacement therapy (postmenopausal): Secondary | ICD-10-CM | POA: Diagnosis not present

## 2020-07-05 DIAGNOSIS — K859 Acute pancreatitis without necrosis or infection, unspecified: Secondary | ICD-10-CM | POA: Diagnosis not present

## 2020-07-05 DIAGNOSIS — E039 Hypothyroidism, unspecified: Secondary | ICD-10-CM | POA: Diagnosis not present

## 2020-07-05 DIAGNOSIS — K8010 Calculus of gallbladder with chronic cholecystitis without obstruction: Secondary | ICD-10-CM | POA: Diagnosis not present

## 2020-07-05 DIAGNOSIS — Z6841 Body Mass Index (BMI) 40.0 and over, adult: Secondary | ICD-10-CM | POA: Diagnosis not present

## 2020-07-05 MED ORDER — HYDROCODONE-ACETAMINOPHEN 5-325 MG PO TABS: 1.0000 | ORAL_TABLET | Freq: Four times a day (QID) | ORAL | 0 refills | Status: AC | PRN

## 2020-07-05 NOTE — Discharge Instructions (Signed)

## 2020-07-05 NOTE — Plan of Care (Signed)
°  Problem: Education: Goal: Required Educational Video(s) Outcome: Progressing   Problem: Clinical Measurements: Goal: Will remain free from infection Outcome: Progressing

## 2020-07-05 NOTE — Plan of Care (Signed)
Patient discharging all care plans met/discontinued. Amber May

## 2020-07-05 NOTE — Progress Notes (Signed)
Patient and guardian both verbalized understanding of dc instructions and medications. Verified both know how to empty JP drain that she is discharging with.

## 2020-07-05 NOTE — Discharge Summary (Signed)
Physician Discharge Summary  Patient ID: Amber May MRN: [id number] DOB/AGE: 02-18-1981 39 y.o.  Admit date: 07/03/2020 Discharge date: 07/05/2020  Admission Diagnoses: cholecystitis   Discharge Diagnoses:  Active Problems:   Cholecystitis   Discharged Condition: good  Hospital Course: Pt admitted after difficult lap chole.  Her diet was advanced as tolerated.  She was discharged to home once tolerating solid foods.  She will continue her drain at home until output resolves.  Consults: None  Significant Diagnostic Studies: labs: cbc, bmet  Treatments: IV hydration, analgesia: Vicodin and surgery: lap chole  Discharge Exam: Blood pressure 124/81, pulse 75, temperature 98.3 F (36.8 C), temperature source Oral, resp. rate 18, height 5\' 2"  (1.575 m), weight 128.4 kg, last menstrual period 06/08/2020, SpO2 96 %. General appearance: alert and cooperative GI: normal findings: soft, non-tender Incision/Wound: clean, dry, intact JP: slight bilious drainage  Disposition: home   Allergies as of 07/05/2020   No Known Allergies     Medication List    TAKE these medications   acetaminophen 500 MG tablet Commonly known as: TYLENOL Take 500 mg by mouth every 6 (six) hours as needed for mild pain or moderate pain.   HYDROcodone-acetaminophen 5-325 MG tablet Commonly known as: NORCO/VICODIN Take 1-2 tablets by mouth every 6 (six) hours as needed for moderate pain.   levothyroxine 125 MCG tablet Commonly known as: SYNTHROID Take 125 mcg by mouth daily before breakfast.   polyethylene glycol 17 g packet Commonly known as: MIRALAX / GLYCOLAX Take 17 g by mouth daily.   senna 8.6 MG Tabs tablet Commonly known as: SENOKOT Take 1 tablet (8.6 mg total) by mouth daily.       Follow-up Information    07/07/2020, MD. Schedule an appointment as soon as possible for a visit in 2 week(s).   Specialty: General Surgery Contact information: [address] [place] [phone]               Signed: [phone] 07/05/2020, 8:38 AM

## 2020-07-06 LAB — SURGICAL PATHOLOGY

## 2020-10-22 DIAGNOSIS — E039 Hypothyroidism, unspecified: Secondary | ICD-10-CM | POA: Diagnosis not present

## 2020-11-17 DIAGNOSIS — E039 Hypothyroidism, unspecified: Secondary | ICD-10-CM | POA: Diagnosis not present

## 2020-11-17 DIAGNOSIS — Z6841 Body Mass Index (BMI) 40.0 and over, adult: Secondary | ICD-10-CM | POA: Diagnosis not present

## 2021-11-25 DIAGNOSIS — Z6841 Body Mass Index (BMI) 40.0 and over, adult: Secondary | ICD-10-CM | POA: Diagnosis not present

## 2021-11-25 DIAGNOSIS — E039 Hypothyroidism, unspecified: Secondary | ICD-10-CM | POA: Diagnosis not present

## 2021-12-01 DIAGNOSIS — E039 Hypothyroidism, unspecified: Secondary | ICD-10-CM | POA: Diagnosis not present

## 2021-12-01 DIAGNOSIS — R7309 Other abnormal glucose: Secondary | ICD-10-CM | POA: Diagnosis not present

## 2021-12-01 DIAGNOSIS — Z6841 Body Mass Index (BMI) 40.0 and over, adult: Secondary | ICD-10-CM | POA: Diagnosis not present

## 2021-12-01 DIAGNOSIS — E781 Pure hyperglyceridemia: Secondary | ICD-10-CM | POA: Diagnosis not present

## 2022-04-02 IMAGING — CT CT ABD-PELV W/O CM
2 of 6 series · 16 of 46 positions shown, 18 images · IV contrast (APPLIED)
Comparison: CT abdomen pelvis dated 04/02/2020.

CLINICAL DATA: 39-year-old female with cholecystitis status post
percutaneous cholecystostomy. Continued leukocytosis.

EXAM:
CT ABDOMEN AND PELVIS WITHOUT CONTRAST
TECHNIQUE: Multidetector CT imaging of the abdomen and pelvis was performed
following the standard protocol without IV contrast.

[Series 2: axial st · axial · 0.98mm/px · z∈[-568,-98]mm · 13 of 108 slices shown, 15 images]
[im 7/108  soft-tissue]
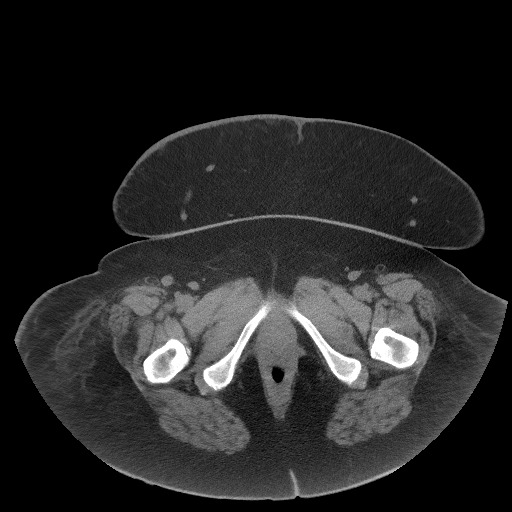
[im 7/108  bone]
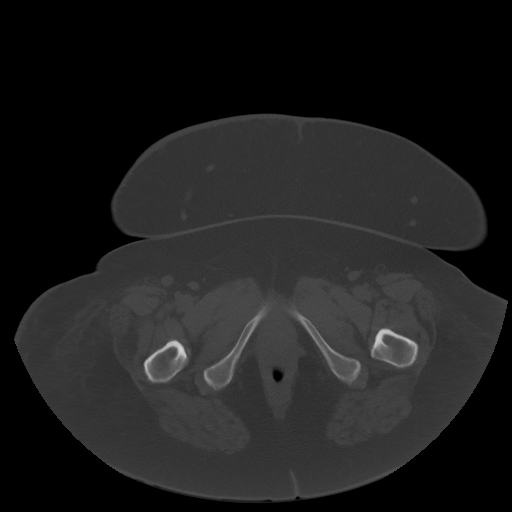
[im 14/108  soft-tissue]
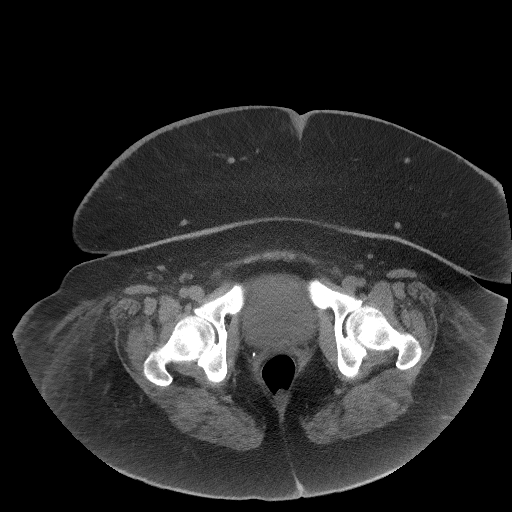
[im 21/108  soft-tissue]
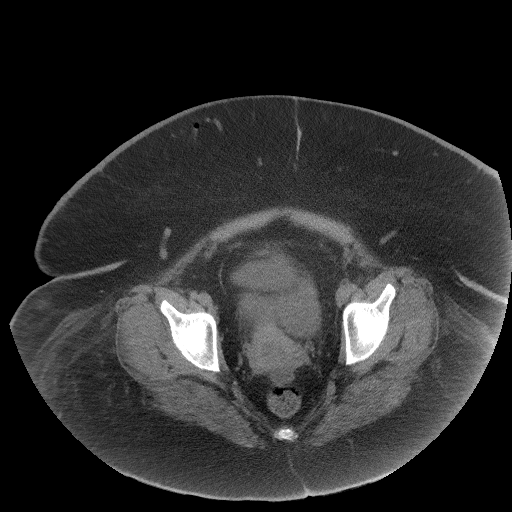
[im 34/108  soft-tissue]
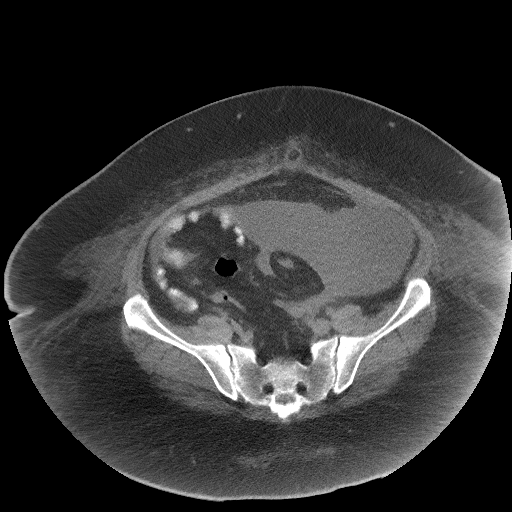
[im 41/108  soft-tissue]
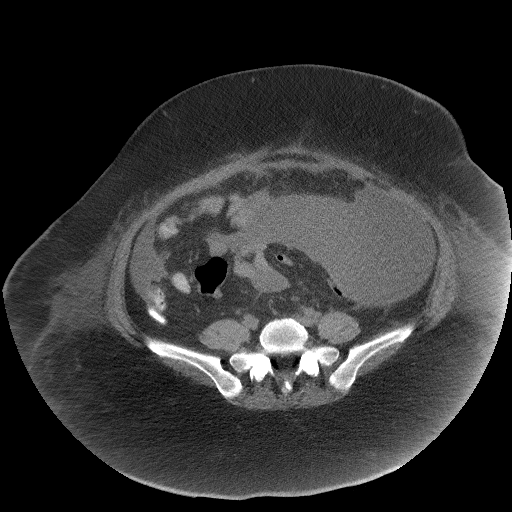
[im 47/108  soft-tissue]
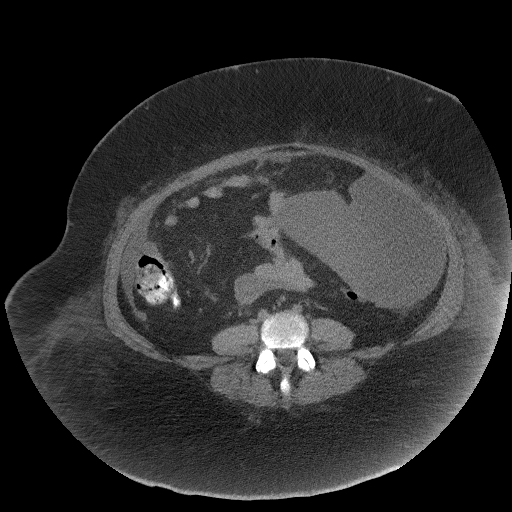
[im 54/108  soft-tissue]
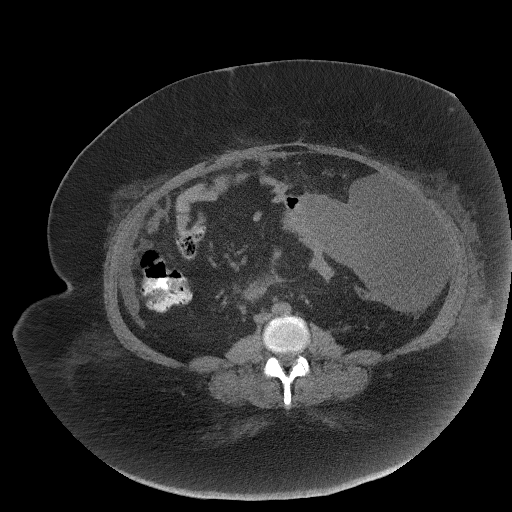
[im 61/108  soft-tissue]
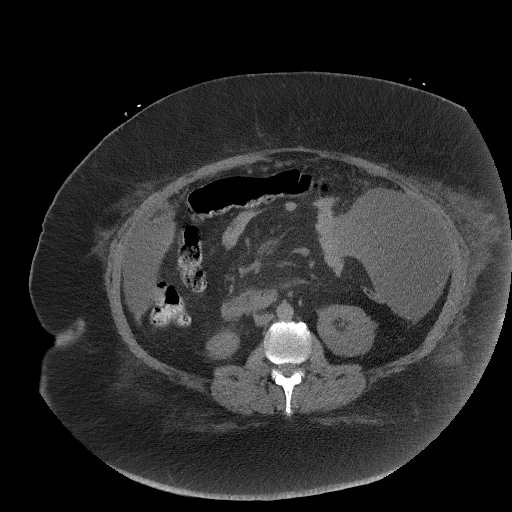
[im 67/108  soft-tissue]
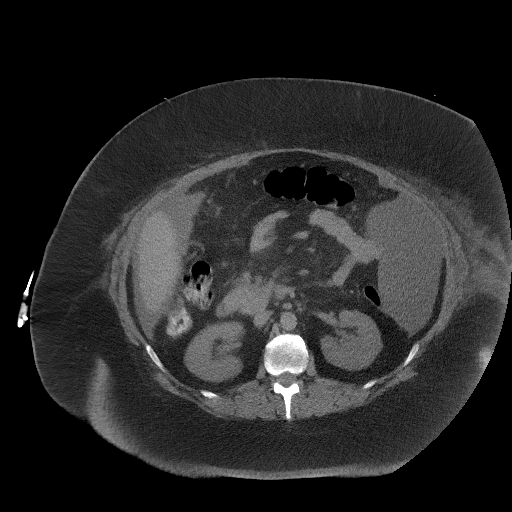
[im 67/108  bone]
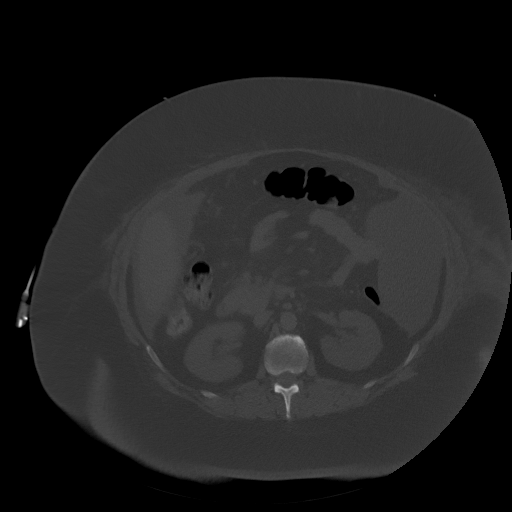
[im 74/108  soft-tissue]
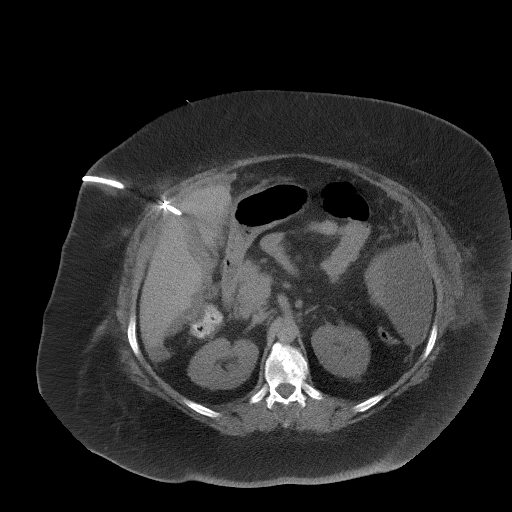
[im 87/108  soft-tissue]
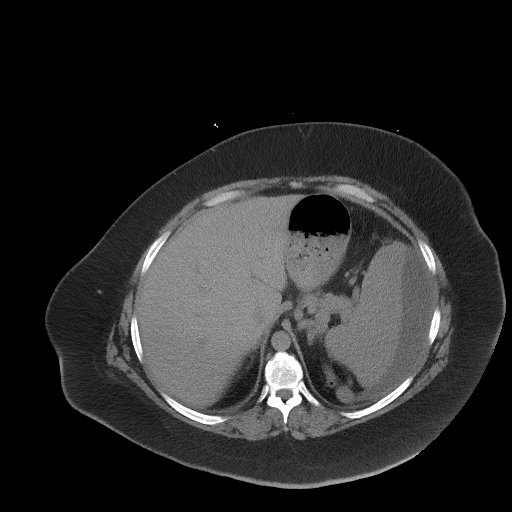
[im 94/108  soft-tissue]
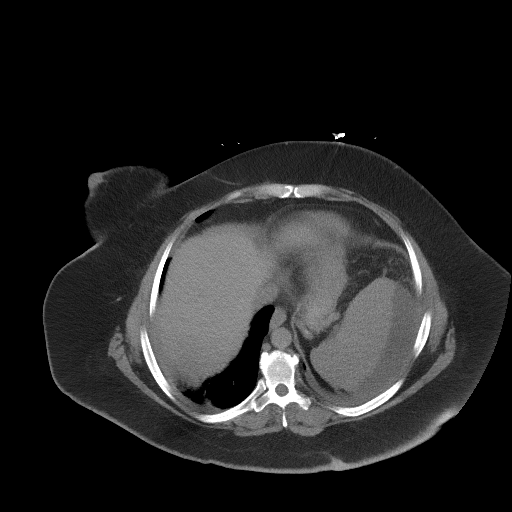
[im 101/108  soft-tissue]
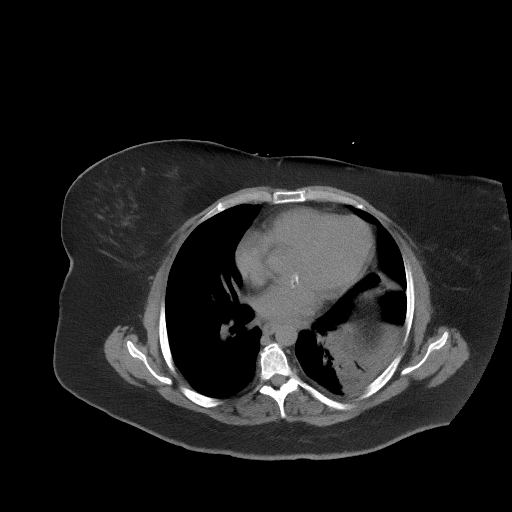

[Series 9: cor st · coronal · 0.97mm/px · 3 of 144 slices shown]
[im 29/144  soft-tissue]
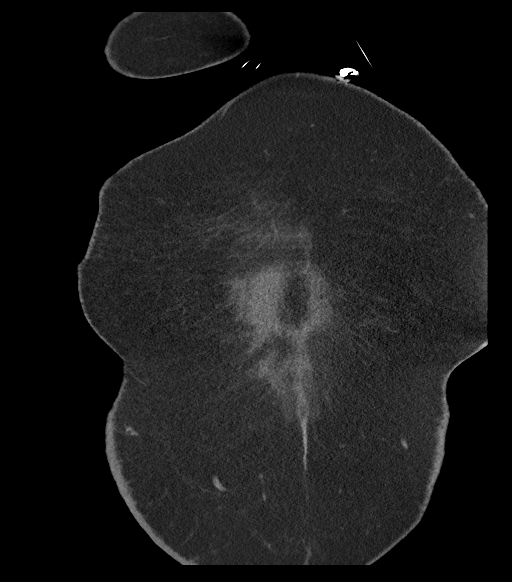
[im 58/144  soft-tissue]
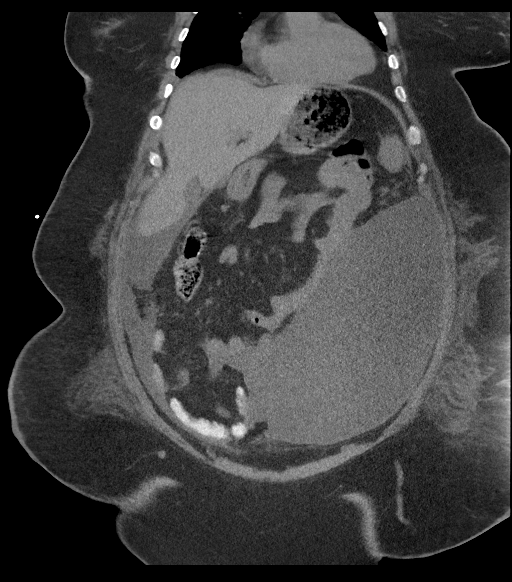
[im 86/144  soft-tissue]
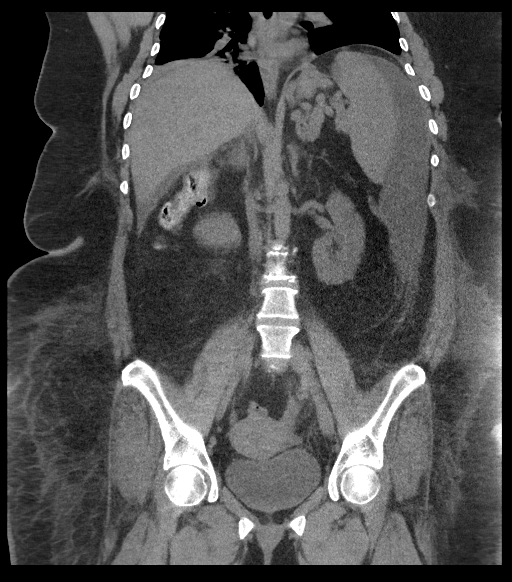

[16 of 46 positions shown; findings below may reference images not displayed]

FINDINGS: Evaluation of this exam is limited in the absence of intravenous
contrast.

Lower chest: There are trace bilateral pleural effusions with
bibasilar patchy densities which may represent atelectasis or
infiltrate. Clinical correlation is recommended. There is mild
eventration of the left hemidiaphragm. Coronary vascular
calcifications noted.

No intra-abdominal free air. There is a large fluid collection
lateral to the spleen extending inferiorly to the anterior pelvis.
This fluid measures approximately 11 x 13 cm in axial dimensions and
34 cm in craniocaudal length. There is associated mass effect on the
peritoneal structures. In addition there is a smaller fluid in the
right abdomen extending from the subhepatic area inferiorly.

Hepatobiliary: The liver is unremarkable. No intrahepatic biliary
ductal dilatation. The gallbladder is contracted. A percutaneous
cholecystostomy is noted with pigtail tip in the region of the
gallbladder fundus. No calcified gallstone.

Pancreas: Unremarkable. No pancreatic ductal dilatation or
surrounding inflammatory changes.

Spleen: The spleen is mildly displaced medially secondary to mass
effect caused by adjacent fluid. The spleen is otherwise
unremarkable.

Adrenals/Urinary Tract: The adrenal glands unremarkable. There is no
hydronephrosis or nephrolithiasis on either side. The visualized
ureters and urinary bladder appear unremarkable.

Stomach/Bowel: There is no bowel obstruction or active inflammation.
There is displacement of the bowel by the abdominal fluid
collection.

Vascular/Lymphatic: The abdominal aorta and IVC are grossly
unremarkable. No portal venous gas. There is no adenopathy.

Reproductive: The uterus is anteverted and grossly unremarkable. No
adnexal masses.

Other: There is subcutaneous edema.

Musculoskeletal: Mild degenerative changes of the spine. No acute
osseous pathology.
IMPRESSION: 1. Large fluid collection lateral to the spleen extending inferiorly
to the anterior pelvis with associated mass effect on the peritoneal
structures.
2. Percutaneous cholecystostomy with pigtail tip in the region of
the gallbladder fundus.
3. No bowel obstruction.
4. Trace bilateral pleural effusions with bibasilar patchy densities
which may represent atelectasis or infiltrate. Clinical correlation
is recommended.
5. Aortic Atherosclerosis ([id number].R).

## 2022-05-23 IMAGING — XA IR CATHETER TUBE CHANGE
1 series · 1 of 1 positions shown · non-contrast
Comparison: none

INDICATION: 39-year-old female with a history of percutaneous cholecystostomy
and abdominal abscess referred for replacement of drainage tube

[Series 1: fl neuro · 1 of 1 slices shown]
[im 1/1]
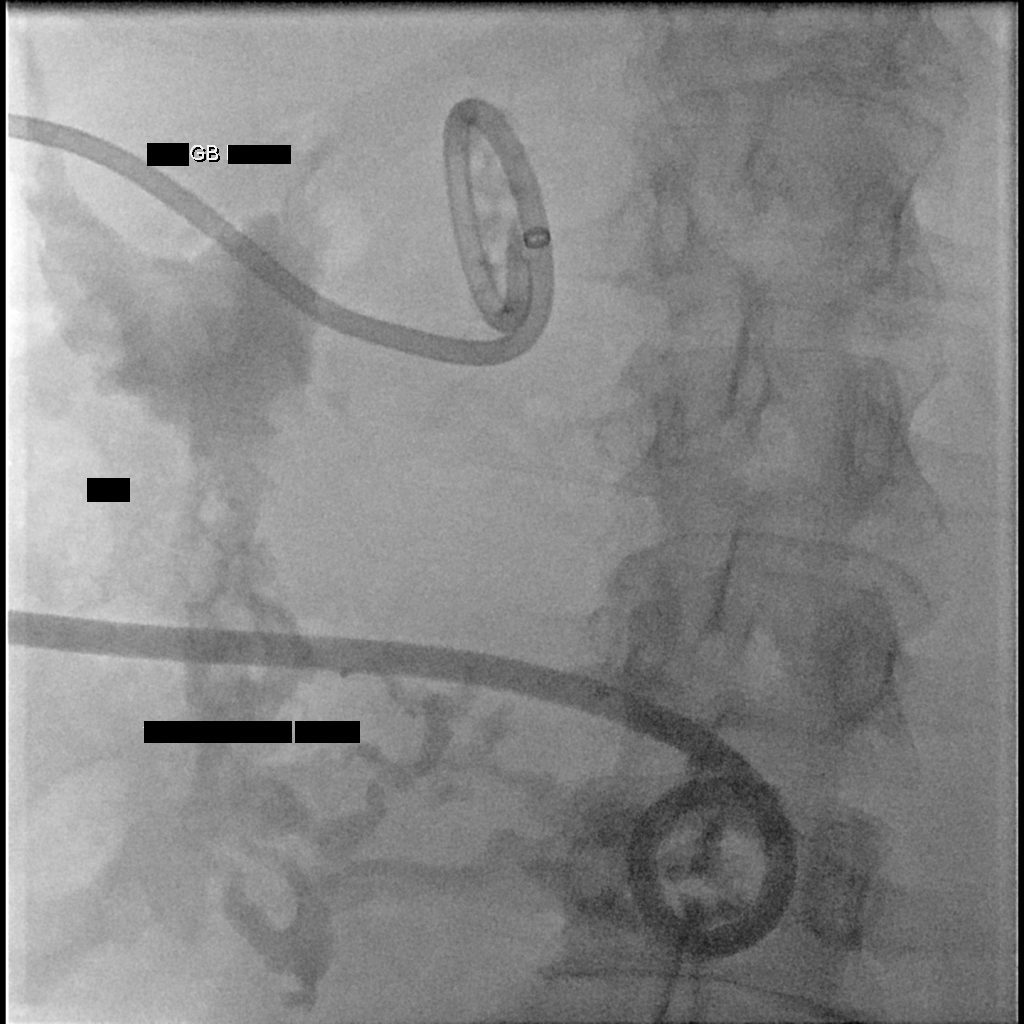

[1 of 1 positions shown; findings below may reference images not displayed]

EXAM:
IMAGE GUIDED EXCHANGE OF PERCUTANEOUS CHOLECYSTOSTOMY

IMAGE GUIDED EXCHANGE OF ABSCESS DRAIN

MEDICATIONS:
None

ANESTHESIA/SEDATION:
None.

FLUOROSCOPY TIME:  Fluoroscopy Time: 0 minutes 24 seconds (15 mGy).

COMPLICATIONS:
None

PROCEDURE:
Informed written consent was obtained from the patient after a
thorough discussion of the procedural risks, benefits and
alternatives. All questions were addressed. Maximal Sterile Barrier
Technique was utilized including caps, mask, sterile gowns, sterile
gloves, sterile drape, hand hygiene and skin antiseptic. A timeout
was performed prior to the initiation of the procedure.

Patient was positioned supine position on the table. Scout images
were acquired.

Contrast was injected through both drains to confirm location.

1% lidocaine was used for local anesthesia.

Modified Seldinger technique was used to exchange first the 12
French percutaneous cholecystostomy, and then the 16 French abscess
drain.

Both drains were sutured in position. The percutaneous
cholecystostomy was attached to gravity drain.

The abscess drain was attached to bulb suction.

Patient tolerated the procedure well and remained hemodynamically
stable throughout.

No complications were encountered and no significant blood loss.
IMPRESSION: Status post routine exchange of 12 French percutaneous
cholecystostomy and 16 French abdominal abscess drain.
